# Patient Record
Sex: Female | Born: 1940 | Race: White | Hispanic: No | Marital: Married | Smoking: Never smoker
Health system: Southern US, Community
[De-identification: ages and names within clinical notes are randomized; demographics above are authoritative.]

## PROBLEM LIST (undated history)

## (undated) DIAGNOSIS — J690 Pneumonitis due to inhalation of food and vomit: Secondary | ICD-10-CM

## (undated) DIAGNOSIS — N3289 Other specified disorders of bladder: Secondary | ICD-10-CM

## (undated) DIAGNOSIS — N390 Urinary tract infection, site not specified: Secondary | ICD-10-CM

## (undated) DIAGNOSIS — R1310 Dysphagia, unspecified: Secondary | ICD-10-CM

## (undated) DIAGNOSIS — C649 Malignant neoplasm of unspecified kidney, except renal pelvis: Secondary | ICD-10-CM

## (undated) DIAGNOSIS — G20C Parkinsonism, unspecified: Secondary | ICD-10-CM

## (undated) DIAGNOSIS — G809 Cerebral palsy, unspecified: Secondary | ICD-10-CM

## (undated) DIAGNOSIS — N289 Disorder of kidney and ureter, unspecified: Secondary | ICD-10-CM

## (undated) DIAGNOSIS — Z515 Encounter for palliative care: Secondary | ICD-10-CM

## (undated) DIAGNOSIS — G20 Parkinson's disease: Secondary | ICD-10-CM

## (undated) DIAGNOSIS — S48919A Complete traumatic amputation of unspecified shoulder and upper arm, level unspecified, initial encounter: Secondary | ICD-10-CM

## (undated) HISTORY — PX: NEPHRECTOMY: SHX65

## (undated) HISTORY — PX: PEG TUBE PLACEMENT: SUR1034

## (undated) HISTORY — PX: KNEE SURGERY: SHX244

## (undated) HISTORY — PX: HAND AMPUTATION THROUGH WRIST: SHX660

---

## 1997-05-24 ENCOUNTER — Emergency Department (HOSPITAL_COMMUNITY): Admission: EM | Admit: 1997-05-24 | Discharge: 1997-05-24 | Payer: Self-pay | Admitting: Emergency Medicine

## 1997-06-29 ENCOUNTER — Emergency Department (HOSPITAL_COMMUNITY): Admission: EM | Admit: 1997-06-29 | Discharge: 1997-06-29 | Payer: Self-pay | Admitting: Emergency Medicine

## 1997-07-13 ENCOUNTER — Emergency Department (HOSPITAL_COMMUNITY): Admission: EM | Admit: 1997-07-13 | Discharge: 1997-07-13 | Payer: Self-pay | Admitting: Emergency Medicine

## 1997-07-17 ENCOUNTER — Emergency Department (HOSPITAL_COMMUNITY): Admission: EM | Admit: 1997-07-17 | Discharge: 1997-07-17 | Payer: Self-pay | Admitting: Emergency Medicine

## 1997-07-20 ENCOUNTER — Emergency Department (HOSPITAL_COMMUNITY): Admission: EM | Admit: 1997-07-20 | Discharge: 1997-07-20 | Payer: Self-pay | Admitting: Emergency Medicine

## 1997-07-24 ENCOUNTER — Emergency Department (HOSPITAL_COMMUNITY): Admission: EM | Admit: 1997-07-24 | Discharge: 1997-07-24 | Payer: Self-pay | Admitting: Emergency Medicine

## 1997-08-05 ENCOUNTER — Emergency Department (HOSPITAL_COMMUNITY): Admission: EM | Admit: 1997-08-05 | Discharge: 1997-08-05 | Payer: Self-pay | Admitting: Emergency Medicine

## 1997-08-11 ENCOUNTER — Emergency Department (HOSPITAL_COMMUNITY): Admission: EM | Admit: 1997-08-11 | Discharge: 1997-08-11 | Payer: Self-pay | Admitting: Emergency Medicine

## 1997-08-15 ENCOUNTER — Emergency Department (HOSPITAL_COMMUNITY): Admission: EM | Admit: 1997-08-15 | Discharge: 1997-08-15 | Payer: Self-pay | Admitting: Emergency Medicine

## 1997-08-22 ENCOUNTER — Emergency Department (HOSPITAL_COMMUNITY): Admission: EM | Admit: 1997-08-22 | Discharge: 1997-08-22 | Payer: Self-pay

## 1997-08-27 ENCOUNTER — Emergency Department (HOSPITAL_COMMUNITY): Admission: EM | Admit: 1997-08-27 | Discharge: 1997-08-27 | Payer: Self-pay | Admitting: Emergency Medicine

## 1997-08-31 ENCOUNTER — Emergency Department (HOSPITAL_COMMUNITY): Admission: EM | Admit: 1997-08-31 | Discharge: 1997-08-31 | Payer: Self-pay | Admitting: Emergency Medicine

## 1997-08-31 ENCOUNTER — Ambulatory Visit (HOSPITAL_COMMUNITY): Admission: RE | Admit: 1997-08-31 | Discharge: 1997-08-31 | Payer: Self-pay | Admitting: Neurosurgery

## 1997-09-15 ENCOUNTER — Emergency Department (HOSPITAL_COMMUNITY): Admission: EM | Admit: 1997-09-15 | Discharge: 1997-09-15 | Payer: Self-pay | Admitting: Emergency Medicine

## 1997-09-16 ENCOUNTER — Ambulatory Visit (HOSPITAL_COMMUNITY): Admission: RE | Admit: 1997-09-16 | Discharge: 1997-09-16 | Payer: Self-pay | Admitting: Family Medicine

## 1997-09-21 ENCOUNTER — Emergency Department (HOSPITAL_COMMUNITY): Admission: EM | Admit: 1997-09-21 | Discharge: 1997-09-22 | Payer: Self-pay | Admitting: Emergency Medicine

## 1997-09-27 ENCOUNTER — Emergency Department (HOSPITAL_COMMUNITY): Admission: EM | Admit: 1997-09-27 | Discharge: 1997-09-27 | Payer: Self-pay | Admitting: Emergency Medicine

## 1997-10-06 ENCOUNTER — Emergency Department (HOSPITAL_COMMUNITY): Admission: EM | Admit: 1997-10-06 | Discharge: 1997-10-06 | Payer: Self-pay | Admitting: Emergency Medicine

## 1997-10-09 ENCOUNTER — Emergency Department (HOSPITAL_COMMUNITY): Admission: EM | Admit: 1997-10-09 | Discharge: 1997-10-09 | Payer: Self-pay | Admitting: Emergency Medicine

## 1997-10-23 ENCOUNTER — Emergency Department (HOSPITAL_COMMUNITY): Admission: EM | Admit: 1997-10-23 | Discharge: 1997-10-23 | Payer: Self-pay | Admitting: Emergency Medicine

## 1997-10-25 ENCOUNTER — Emergency Department (HOSPITAL_COMMUNITY): Admission: EM | Admit: 1997-10-25 | Discharge: 1997-10-25 | Payer: Self-pay | Admitting: Emergency Medicine

## 1997-10-27 ENCOUNTER — Emergency Department (HOSPITAL_COMMUNITY): Admission: EM | Admit: 1997-10-27 | Discharge: 1997-10-27 | Payer: Self-pay | Admitting: Emergency Medicine

## 1997-10-31 ENCOUNTER — Emergency Department (HOSPITAL_COMMUNITY): Admission: EM | Admit: 1997-10-31 | Discharge: 1997-10-31 | Payer: Self-pay | Admitting: Emergency Medicine

## 1997-10-31 ENCOUNTER — Encounter: Payer: Self-pay | Admitting: Emergency Medicine

## 1997-11-14 ENCOUNTER — Emergency Department (HOSPITAL_COMMUNITY): Admission: EM | Admit: 1997-11-14 | Discharge: 1997-11-14 | Payer: Self-pay | Admitting: Emergency Medicine

## 1997-11-15 ENCOUNTER — Emergency Department (HOSPITAL_COMMUNITY): Admission: EM | Admit: 1997-11-15 | Discharge: 1997-11-15 | Payer: Self-pay | Admitting: Emergency Medicine

## 1997-11-25 ENCOUNTER — Emergency Department (HOSPITAL_COMMUNITY): Admission: EM | Admit: 1997-11-25 | Discharge: 1997-11-25 | Payer: Self-pay | Admitting: Emergency Medicine

## 1997-11-25 ENCOUNTER — Encounter: Payer: Self-pay | Admitting: Emergency Medicine

## 1997-12-12 ENCOUNTER — Emergency Department (HOSPITAL_COMMUNITY): Admission: EM | Admit: 1997-12-12 | Discharge: 1997-12-12 | Payer: Self-pay | Admitting: Emergency Medicine

## 1997-12-14 ENCOUNTER — Emergency Department (HOSPITAL_COMMUNITY): Admission: EM | Admit: 1997-12-14 | Discharge: 1997-12-14 | Payer: Self-pay | Admitting: Emergency Medicine

## 1997-12-16 ENCOUNTER — Emergency Department (HOSPITAL_COMMUNITY): Admission: EM | Admit: 1997-12-16 | Discharge: 1997-12-16 | Payer: Self-pay | Admitting: Emergency Medicine

## 1997-12-20 ENCOUNTER — Emergency Department (HOSPITAL_COMMUNITY): Admission: EM | Admit: 1997-12-20 | Discharge: 1997-12-20 | Payer: Self-pay | Admitting: Emergency Medicine

## 1998-01-03 ENCOUNTER — Encounter: Payer: Self-pay | Admitting: Emergency Medicine

## 1998-01-03 ENCOUNTER — Emergency Department (HOSPITAL_COMMUNITY): Admission: EM | Admit: 1998-01-03 | Discharge: 1998-01-03 | Payer: Self-pay | Admitting: Emergency Medicine

## 1998-01-11 ENCOUNTER — Emergency Department (HOSPITAL_COMMUNITY): Admission: EM | Admit: 1998-01-11 | Discharge: 1998-01-11 | Payer: Self-pay | Admitting: Internal Medicine

## 1998-01-15 ENCOUNTER — Emergency Department (HOSPITAL_COMMUNITY): Admission: EM | Admit: 1998-01-15 | Discharge: 1998-01-15 | Payer: Self-pay | Admitting: Emergency Medicine

## 1998-01-15 ENCOUNTER — Encounter: Payer: Self-pay | Admitting: Emergency Medicine

## 1998-01-20 ENCOUNTER — Emergency Department (HOSPITAL_COMMUNITY): Admission: EM | Admit: 1998-01-20 | Discharge: 1998-01-20 | Payer: Self-pay | Admitting: Emergency Medicine

## 1998-01-28 ENCOUNTER — Emergency Department (HOSPITAL_COMMUNITY): Admission: EM | Admit: 1998-01-28 | Discharge: 1998-01-28 | Payer: Self-pay | Admitting: Emergency Medicine

## 1998-02-03 ENCOUNTER — Emergency Department (HOSPITAL_COMMUNITY): Admission: EM | Admit: 1998-02-03 | Discharge: 1998-02-03 | Payer: Self-pay

## 1998-02-14 ENCOUNTER — Emergency Department (HOSPITAL_COMMUNITY): Admission: EM | Admit: 1998-02-14 | Discharge: 1998-02-15 | Payer: Self-pay

## 1998-02-15 ENCOUNTER — Encounter: Payer: Self-pay | Admitting: Emergency Medicine

## 1998-02-20 ENCOUNTER — Emergency Department (HOSPITAL_COMMUNITY): Admission: EM | Admit: 1998-02-20 | Discharge: 1998-02-20 | Payer: Self-pay | Admitting: Emergency Medicine

## 1998-03-06 ENCOUNTER — Emergency Department (HOSPITAL_COMMUNITY): Admission: EM | Admit: 1998-03-06 | Discharge: 1998-03-06 | Payer: Self-pay | Admitting: Emergency Medicine

## 1998-03-07 ENCOUNTER — Emergency Department (HOSPITAL_COMMUNITY): Admission: EM | Admit: 1998-03-07 | Discharge: 1998-03-08 | Payer: Self-pay | Admitting: Emergency Medicine

## 1998-03-08 ENCOUNTER — Encounter: Payer: Self-pay | Admitting: Emergency Medicine

## 1998-03-08 ENCOUNTER — Emergency Department (HOSPITAL_COMMUNITY): Admission: EM | Admit: 1998-03-08 | Discharge: 1998-03-08 | Payer: Self-pay | Admitting: Emergency Medicine

## 1998-03-14 ENCOUNTER — Emergency Department (HOSPITAL_COMMUNITY): Admission: EM | Admit: 1998-03-14 | Discharge: 1998-03-14 | Payer: Self-pay | Admitting: Emergency Medicine

## 1998-03-28 ENCOUNTER — Emergency Department (HOSPITAL_COMMUNITY): Admission: EM | Admit: 1998-03-28 | Discharge: 1998-03-28 | Payer: Self-pay | Admitting: Emergency Medicine

## 1998-03-28 ENCOUNTER — Encounter: Payer: Self-pay | Admitting: Emergency Medicine

## 1998-04-04 ENCOUNTER — Emergency Department (HOSPITAL_COMMUNITY): Admission: EM | Admit: 1998-04-04 | Discharge: 1998-04-04 | Payer: Self-pay | Admitting: Emergency Medicine

## 1998-04-06 ENCOUNTER — Emergency Department (HOSPITAL_COMMUNITY): Admission: EM | Admit: 1998-04-06 | Discharge: 1998-04-06 | Payer: Self-pay | Admitting: Internal Medicine

## 1998-04-12 ENCOUNTER — Emergency Department (HOSPITAL_COMMUNITY): Admission: EM | Admit: 1998-04-12 | Discharge: 1998-04-12 | Payer: Self-pay

## 1998-06-02 ENCOUNTER — Ambulatory Visit (HOSPITAL_BASED_OUTPATIENT_CLINIC_OR_DEPARTMENT_OTHER): Admission: RE | Admit: 1998-06-02 | Discharge: 1998-06-02 | Payer: Self-pay | Admitting: Orthopedic Surgery

## 1998-06-27 ENCOUNTER — Emergency Department (HOSPITAL_COMMUNITY): Admission: EM | Admit: 1998-06-27 | Discharge: 1998-06-27 | Payer: Self-pay | Admitting: Emergency Medicine

## 1998-07-01 ENCOUNTER — Emergency Department (HOSPITAL_COMMUNITY): Admission: EM | Admit: 1998-07-01 | Discharge: 1998-07-01 | Payer: Self-pay | Admitting: Emergency Medicine

## 1998-07-02 ENCOUNTER — Encounter: Payer: Self-pay | Admitting: Emergency Medicine

## 1998-07-02 ENCOUNTER — Emergency Department (HOSPITAL_COMMUNITY): Admission: EM | Admit: 1998-07-02 | Discharge: 1998-07-02 | Payer: Self-pay | Admitting: Emergency Medicine

## 1998-07-11 ENCOUNTER — Emergency Department (HOSPITAL_COMMUNITY): Admission: EM | Admit: 1998-07-11 | Discharge: 1998-07-11 | Payer: Self-pay | Admitting: Emergency Medicine

## 1998-07-14 ENCOUNTER — Emergency Department (HOSPITAL_COMMUNITY): Admission: EM | Admit: 1998-07-14 | Discharge: 1998-07-14 | Payer: Self-pay | Admitting: Emergency Medicine

## 1998-07-21 ENCOUNTER — Emergency Department (HOSPITAL_COMMUNITY): Admission: EM | Admit: 1998-07-21 | Discharge: 1998-07-22 | Payer: Self-pay | Admitting: Emergency Medicine

## 1998-07-31 ENCOUNTER — Encounter: Payer: Self-pay | Admitting: Internal Medicine

## 1998-07-31 ENCOUNTER — Emergency Department (HOSPITAL_COMMUNITY): Admission: EM | Admit: 1998-07-31 | Discharge: 1998-07-31 | Payer: Self-pay | Admitting: Internal Medicine

## 1998-08-03 ENCOUNTER — Encounter: Payer: Self-pay | Admitting: Emergency Medicine

## 1998-08-03 ENCOUNTER — Emergency Department (HOSPITAL_COMMUNITY): Admission: EM | Admit: 1998-08-03 | Discharge: 1998-08-03 | Payer: Self-pay | Admitting: Emergency Medicine

## 1998-08-04 ENCOUNTER — Ambulatory Visit: Admission: RE | Admit: 1998-08-04 | Discharge: 1998-08-04 | Payer: Self-pay | Admitting: Emergency Medicine

## 1998-08-04 ENCOUNTER — Encounter: Payer: Self-pay | Admitting: Emergency Medicine

## 1998-08-14 ENCOUNTER — Inpatient Hospital Stay (HOSPITAL_COMMUNITY): Admission: RE | Admit: 1998-08-14 | Discharge: 1998-08-18 | Payer: Self-pay | Admitting: Urology

## 1998-09-02 ENCOUNTER — Emergency Department (HOSPITAL_COMMUNITY): Admission: EM | Admit: 1998-09-02 | Discharge: 1998-09-02 | Payer: Self-pay | Admitting: *Deleted

## 1998-09-13 ENCOUNTER — Encounter: Payer: Self-pay | Admitting: Emergency Medicine

## 1998-09-13 ENCOUNTER — Emergency Department (HOSPITAL_COMMUNITY): Admission: EM | Admit: 1998-09-13 | Discharge: 1998-09-13 | Payer: Self-pay | Admitting: Emergency Medicine

## 1998-09-26 ENCOUNTER — Emergency Department (HOSPITAL_COMMUNITY): Admission: EM | Admit: 1998-09-26 | Discharge: 1998-09-26 | Payer: Self-pay | Admitting: Internal Medicine

## 1998-10-02 ENCOUNTER — Emergency Department (HOSPITAL_COMMUNITY): Admission: EM | Admit: 1998-10-02 | Discharge: 1998-10-03 | Payer: Self-pay | Admitting: Emergency Medicine

## 1998-10-05 ENCOUNTER — Encounter: Payer: Self-pay | Admitting: Emergency Medicine

## 1998-10-05 ENCOUNTER — Emergency Department (HOSPITAL_COMMUNITY): Admission: EM | Admit: 1998-10-05 | Discharge: 1998-10-05 | Payer: Self-pay | Admitting: Emergency Medicine

## 1998-10-08 ENCOUNTER — Emergency Department (HOSPITAL_COMMUNITY): Admission: EM | Admit: 1998-10-08 | Discharge: 1998-10-09 | Payer: Self-pay | Admitting: Emergency Medicine

## 1998-10-24 ENCOUNTER — Emergency Department (HOSPITAL_COMMUNITY): Admission: EM | Admit: 1998-10-24 | Discharge: 1998-10-24 | Payer: Self-pay | Admitting: Internal Medicine

## 1998-11-02 ENCOUNTER — Emergency Department (HOSPITAL_COMMUNITY): Admission: EM | Admit: 1998-11-02 | Discharge: 1998-11-02 | Payer: Self-pay | Admitting: Emergency Medicine

## 1998-11-17 ENCOUNTER — Emergency Department (HOSPITAL_COMMUNITY): Admission: EM | Admit: 1998-11-17 | Discharge: 1998-11-18 | Payer: Self-pay | Admitting: Emergency Medicine

## 1998-11-18 ENCOUNTER — Encounter: Payer: Self-pay | Admitting: Emergency Medicine

## 1998-12-19 ENCOUNTER — Emergency Department (HOSPITAL_COMMUNITY): Admission: EM | Admit: 1998-12-19 | Discharge: 1998-12-19 | Payer: Self-pay | Admitting: Emergency Medicine

## 1998-12-23 ENCOUNTER — Emergency Department (HOSPITAL_COMMUNITY): Admission: EM | Admit: 1998-12-23 | Discharge: 1998-12-23 | Payer: Self-pay | Admitting: *Deleted

## 1998-12-25 ENCOUNTER — Emergency Department (HOSPITAL_COMMUNITY): Admission: EM | Admit: 1998-12-25 | Discharge: 1998-12-25 | Payer: Self-pay | Admitting: Emergency Medicine

## 1999-01-25 ENCOUNTER — Emergency Department (HOSPITAL_COMMUNITY): Admission: EM | Admit: 1999-01-25 | Discharge: 1999-01-25 | Payer: Self-pay | Admitting: *Deleted

## 1999-01-29 ENCOUNTER — Ambulatory Visit (HOSPITAL_COMMUNITY): Admission: RE | Admit: 1999-01-29 | Discharge: 1999-01-29 | Payer: Self-pay | Admitting: Family Medicine

## 1999-01-29 ENCOUNTER — Encounter: Payer: Self-pay | Admitting: Family Medicine

## 1999-02-01 ENCOUNTER — Emergency Department (HOSPITAL_COMMUNITY): Admission: EM | Admit: 1999-02-01 | Discharge: 1999-02-01 | Payer: Self-pay | Admitting: *Deleted

## 1999-02-06 ENCOUNTER — Emergency Department (HOSPITAL_COMMUNITY): Admission: EM | Admit: 1999-02-06 | Discharge: 1999-02-06 | Payer: Self-pay | Admitting: Emergency Medicine

## 1999-03-10 ENCOUNTER — Emergency Department (HOSPITAL_COMMUNITY): Admission: EM | Admit: 1999-03-10 | Discharge: 1999-03-10 | Payer: Self-pay | Admitting: Emergency Medicine

## 1999-03-25 ENCOUNTER — Emergency Department (HOSPITAL_COMMUNITY): Admission: EM | Admit: 1999-03-25 | Discharge: 1999-03-25 | Payer: Self-pay | Admitting: Emergency Medicine

## 1999-04-13 ENCOUNTER — Emergency Department (HOSPITAL_COMMUNITY): Admission: EM | Admit: 1999-04-13 | Discharge: 1999-04-14 | Payer: Self-pay

## 1999-04-26 ENCOUNTER — Emergency Department (HOSPITAL_COMMUNITY): Admission: EM | Admit: 1999-04-26 | Discharge: 1999-04-26 | Payer: Self-pay | Admitting: Emergency Medicine

## 1999-06-25 ENCOUNTER — Emergency Department (HOSPITAL_COMMUNITY): Admission: EM | Admit: 1999-06-25 | Discharge: 1999-06-25 | Payer: Self-pay | Admitting: Emergency Medicine

## 1999-07-28 ENCOUNTER — Encounter: Payer: Self-pay | Admitting: Family Medicine

## 1999-07-28 ENCOUNTER — Ambulatory Visit (HOSPITAL_COMMUNITY): Admission: RE | Admit: 1999-07-28 | Discharge: 1999-07-28 | Payer: Self-pay | Admitting: Family Medicine

## 1999-08-12 ENCOUNTER — Encounter: Payer: Self-pay | Admitting: Dermatology

## 1999-08-12 ENCOUNTER — Ambulatory Visit (HOSPITAL_COMMUNITY): Admission: RE | Admit: 1999-08-12 | Discharge: 1999-08-12 | Payer: Self-pay | Admitting: Dermatology

## 1999-08-30 ENCOUNTER — Ambulatory Visit (HOSPITAL_COMMUNITY): Admission: RE | Admit: 1999-08-30 | Discharge: 1999-08-30 | Payer: Self-pay | Admitting: General Surgery

## 1999-08-30 ENCOUNTER — Encounter (INDEPENDENT_AMBULATORY_CARE_PROVIDER_SITE_OTHER): Payer: Self-pay

## 1999-09-13 ENCOUNTER — Emergency Department (HOSPITAL_COMMUNITY): Admission: EM | Admit: 1999-09-13 | Discharge: 1999-09-13 | Payer: Self-pay | Admitting: Emergency Medicine

## 1999-09-17 ENCOUNTER — Emergency Department (HOSPITAL_COMMUNITY): Admission: EM | Admit: 1999-09-17 | Discharge: 1999-09-17 | Payer: Self-pay | Admitting: Emergency Medicine

## 1999-09-25 ENCOUNTER — Emergency Department (HOSPITAL_COMMUNITY): Admission: EM | Admit: 1999-09-25 | Discharge: 1999-09-25 | Payer: Self-pay | Admitting: Emergency Medicine

## 1999-10-03 ENCOUNTER — Emergency Department (HOSPITAL_COMMUNITY): Admission: EM | Admit: 1999-10-03 | Discharge: 1999-10-03 | Payer: Self-pay

## 1999-10-11 ENCOUNTER — Emergency Department (HOSPITAL_COMMUNITY): Admission: EM | Admit: 1999-10-11 | Discharge: 1999-10-11 | Payer: Self-pay | Admitting: Emergency Medicine

## 1999-11-10 ENCOUNTER — Emergency Department (HOSPITAL_COMMUNITY): Admission: EM | Admit: 1999-11-10 | Discharge: 1999-11-10 | Payer: Self-pay | Admitting: Emergency Medicine

## 1999-11-15 ENCOUNTER — Encounter: Payer: Self-pay | Admitting: General Surgery

## 1999-11-15 ENCOUNTER — Encounter (INDEPENDENT_AMBULATORY_CARE_PROVIDER_SITE_OTHER): Payer: Self-pay | Admitting: *Deleted

## 1999-11-15 ENCOUNTER — Encounter: Admission: RE | Admit: 1999-11-15 | Discharge: 1999-11-15 | Payer: Self-pay | Admitting: General Surgery

## 1999-11-15 ENCOUNTER — Other Ambulatory Visit: Admission: RE | Admit: 1999-11-15 | Discharge: 1999-11-15 | Payer: Self-pay | Admitting: General Surgery

## 1999-11-22 ENCOUNTER — Ambulatory Visit (HOSPITAL_COMMUNITY): Admission: RE | Admit: 1999-11-22 | Discharge: 1999-11-22 | Payer: Self-pay | Admitting: Orthopedic Surgery

## 1999-11-22 ENCOUNTER — Encounter: Payer: Self-pay | Admitting: Rheumatology

## 1999-12-01 ENCOUNTER — Emergency Department (HOSPITAL_COMMUNITY): Admission: EM | Admit: 1999-12-01 | Discharge: 1999-12-01 | Payer: Self-pay | Admitting: Internal Medicine

## 1999-12-04 ENCOUNTER — Encounter: Payer: Self-pay | Admitting: Emergency Medicine

## 1999-12-04 ENCOUNTER — Emergency Department (HOSPITAL_COMMUNITY): Admission: EM | Admit: 1999-12-04 | Discharge: 1999-12-04 | Payer: Self-pay | Admitting: Emergency Medicine

## 1999-12-07 ENCOUNTER — Ambulatory Visit (HOSPITAL_COMMUNITY): Admission: RE | Admit: 1999-12-07 | Discharge: 1999-12-07 | Payer: Self-pay | Admitting: Family Medicine

## 1999-12-24 ENCOUNTER — Emergency Department (HOSPITAL_COMMUNITY): Admission: EM | Admit: 1999-12-24 | Discharge: 1999-12-25 | Payer: Self-pay | Admitting: Emergency Medicine

## 1999-12-25 ENCOUNTER — Emergency Department (HOSPITAL_COMMUNITY): Admission: EM | Admit: 1999-12-25 | Discharge: 1999-12-25 | Payer: Self-pay | Admitting: Emergency Medicine

## 2000-01-07 ENCOUNTER — Ambulatory Visit (HOSPITAL_COMMUNITY): Admission: RE | Admit: 2000-01-07 | Discharge: 2000-01-07 | Payer: Self-pay | Admitting: Family Medicine

## 2000-01-07 ENCOUNTER — Encounter: Payer: Self-pay | Admitting: Family Medicine

## 2000-01-08 ENCOUNTER — Emergency Department (HOSPITAL_COMMUNITY): Admission: EM | Admit: 2000-01-08 | Discharge: 2000-01-08 | Payer: Self-pay | Admitting: Emergency Medicine

## 2000-01-23 ENCOUNTER — Encounter: Payer: Self-pay | Admitting: Emergency Medicine

## 2000-01-23 ENCOUNTER — Emergency Department (HOSPITAL_COMMUNITY): Admission: EM | Admit: 2000-01-23 | Discharge: 2000-01-23 | Payer: Self-pay | Admitting: Emergency Medicine

## 2000-01-31 ENCOUNTER — Emergency Department (HOSPITAL_COMMUNITY): Admission: EM | Admit: 2000-01-31 | Discharge: 2000-01-31 | Payer: Self-pay | Admitting: Internal Medicine

## 2000-01-31 ENCOUNTER — Encounter: Payer: Self-pay | Admitting: Emergency Medicine

## 2000-02-04 ENCOUNTER — Emergency Department (HOSPITAL_COMMUNITY): Admission: EM | Admit: 2000-02-04 | Discharge: 2000-02-04 | Payer: Self-pay | Admitting: *Deleted

## 2000-02-08 ENCOUNTER — Emergency Department (HOSPITAL_COMMUNITY): Admission: EM | Admit: 2000-02-08 | Discharge: 2000-02-08 | Payer: Self-pay | Admitting: Internal Medicine

## 2000-03-02 ENCOUNTER — Emergency Department (HOSPITAL_COMMUNITY): Admission: EM | Admit: 2000-03-02 | Discharge: 2000-03-02 | Payer: Self-pay | Admitting: Emergency Medicine

## 2000-03-04 ENCOUNTER — Emergency Department (HOSPITAL_COMMUNITY): Admission: EM | Admit: 2000-03-04 | Discharge: 2000-03-04 | Payer: Self-pay | Admitting: *Deleted

## 2000-03-11 ENCOUNTER — Emergency Department (HOSPITAL_COMMUNITY): Admission: EM | Admit: 2000-03-11 | Discharge: 2000-03-12 | Payer: Self-pay | Admitting: Emergency Medicine

## 2000-03-21 ENCOUNTER — Emergency Department (HOSPITAL_COMMUNITY): Admission: EM | Admit: 2000-03-21 | Discharge: 2000-03-21 | Payer: Self-pay | Admitting: Emergency Medicine

## 2000-03-21 ENCOUNTER — Encounter: Payer: Self-pay | Admitting: Internal Medicine

## 2000-03-25 ENCOUNTER — Emergency Department (HOSPITAL_COMMUNITY): Admission: EM | Admit: 2000-03-25 | Discharge: 2000-03-25 | Payer: Self-pay | Admitting: Diagnostic Radiology

## 2000-03-25 ENCOUNTER — Encounter: Payer: Self-pay | Admitting: Emergency Medicine

## 2000-03-27 ENCOUNTER — Emergency Department (HOSPITAL_COMMUNITY): Admission: EM | Admit: 2000-03-27 | Discharge: 2000-03-27 | Payer: Self-pay | Admitting: Emergency Medicine

## 2000-03-27 ENCOUNTER — Encounter: Payer: Self-pay | Admitting: Emergency Medicine

## 2000-03-29 ENCOUNTER — Inpatient Hospital Stay (HOSPITAL_COMMUNITY): Admission: EM | Admit: 2000-03-29 | Discharge: 2000-03-31 | Payer: Self-pay | Admitting: Family Medicine

## 2000-03-30 ENCOUNTER — Encounter: Payer: Self-pay | Admitting: Internal Medicine

## 2000-04-29 ENCOUNTER — Emergency Department (HOSPITAL_COMMUNITY): Admission: EM | Admit: 2000-04-29 | Discharge: 2000-04-29 | Payer: Self-pay | Admitting: *Deleted

## 2000-05-02 ENCOUNTER — Emergency Department (HOSPITAL_COMMUNITY): Admission: EM | Admit: 2000-05-02 | Discharge: 2000-05-02 | Payer: Self-pay | Admitting: Emergency Medicine

## 2000-05-07 ENCOUNTER — Emergency Department (HOSPITAL_COMMUNITY): Admission: EM | Admit: 2000-05-07 | Discharge: 2000-05-07 | Payer: Self-pay | Admitting: Emergency Medicine

## 2000-05-08 ENCOUNTER — Emergency Department (HOSPITAL_COMMUNITY): Admission: EM | Admit: 2000-05-08 | Discharge: 2000-05-08 | Payer: Self-pay | Admitting: Emergency Medicine

## 2000-06-19 ENCOUNTER — Emergency Department (HOSPITAL_COMMUNITY): Admission: EM | Admit: 2000-06-19 | Discharge: 2000-06-19 | Payer: Self-pay | Admitting: Emergency Medicine

## 2000-07-01 ENCOUNTER — Encounter: Payer: Self-pay | Admitting: Emergency Medicine

## 2000-07-01 ENCOUNTER — Emergency Department (HOSPITAL_COMMUNITY): Admission: EM | Admit: 2000-07-01 | Discharge: 2000-07-01 | Payer: Self-pay | Admitting: Emergency Medicine

## 2000-07-12 ENCOUNTER — Emergency Department (HOSPITAL_COMMUNITY): Admission: EM | Admit: 2000-07-12 | Discharge: 2000-07-12 | Payer: Self-pay | Admitting: *Deleted

## 2000-07-23 ENCOUNTER — Emergency Department (HOSPITAL_COMMUNITY): Admission: EM | Admit: 2000-07-23 | Discharge: 2000-07-23 | Payer: Self-pay

## 2000-07-29 ENCOUNTER — Emergency Department (HOSPITAL_COMMUNITY): Admission: EM | Admit: 2000-07-29 | Discharge: 2000-07-29 | Payer: Self-pay | Admitting: Internal Medicine

## 2000-08-01 ENCOUNTER — Emergency Department (HOSPITAL_COMMUNITY): Admission: EM | Admit: 2000-08-01 | Discharge: 2000-08-01 | Payer: Self-pay | Admitting: Emergency Medicine

## 2000-09-10 ENCOUNTER — Emergency Department (HOSPITAL_COMMUNITY): Admission: EM | Admit: 2000-09-10 | Discharge: 2000-09-10 | Payer: Self-pay | Admitting: *Deleted

## 2000-09-18 ENCOUNTER — Ambulatory Visit (HOSPITAL_COMMUNITY): Admission: RE | Admit: 2000-09-18 | Discharge: 2000-09-18 | Payer: Self-pay | Admitting: Orthopedic Surgery

## 2000-09-21 ENCOUNTER — Encounter: Payer: Self-pay | Admitting: Emergency Medicine

## 2000-09-21 ENCOUNTER — Encounter: Payer: Self-pay | Admitting: Internal Medicine

## 2000-09-21 ENCOUNTER — Emergency Department (HOSPITAL_COMMUNITY): Admission: EM | Admit: 2000-09-21 | Discharge: 2000-09-22 | Payer: Self-pay | Admitting: Emergency Medicine

## 2000-09-23 ENCOUNTER — Emergency Department (HOSPITAL_COMMUNITY): Admission: EM | Admit: 2000-09-23 | Discharge: 2000-09-23 | Payer: Self-pay | Admitting: Emergency Medicine

## 2000-10-09 ENCOUNTER — Emergency Department (HOSPITAL_COMMUNITY): Admission: EM | Admit: 2000-10-09 | Discharge: 2000-10-09 | Payer: Self-pay | Admitting: Emergency Medicine

## 2000-10-26 ENCOUNTER — Emergency Department (HOSPITAL_COMMUNITY): Admission: EM | Admit: 2000-10-26 | Discharge: 2000-10-26 | Payer: Self-pay | Admitting: Emergency Medicine

## 2000-10-28 ENCOUNTER — Emergency Department (HOSPITAL_COMMUNITY): Admission: EM | Admit: 2000-10-28 | Discharge: 2000-10-28 | Payer: Self-pay

## 2000-10-28 ENCOUNTER — Encounter: Payer: Self-pay | Admitting: Emergency Medicine

## 2000-11-20 ENCOUNTER — Encounter: Payer: Self-pay | Admitting: Emergency Medicine

## 2000-11-20 ENCOUNTER — Emergency Department (HOSPITAL_COMMUNITY): Admission: EM | Admit: 2000-11-20 | Discharge: 2000-11-20 | Payer: Self-pay | Admitting: *Deleted

## 2000-11-24 ENCOUNTER — Emergency Department (HOSPITAL_COMMUNITY): Admission: EM | Admit: 2000-11-24 | Discharge: 2000-11-24 | Payer: Self-pay | Admitting: Emergency Medicine

## 2000-12-11 ENCOUNTER — Emergency Department (HOSPITAL_COMMUNITY): Admission: EM | Admit: 2000-12-11 | Discharge: 2000-12-11 | Payer: Self-pay | Admitting: Emergency Medicine

## 2000-12-11 ENCOUNTER — Encounter: Payer: Self-pay | Admitting: Emergency Medicine

## 2000-12-17 ENCOUNTER — Emergency Department (HOSPITAL_COMMUNITY): Admission: EM | Admit: 2000-12-17 | Discharge: 2000-12-18 | Payer: Self-pay | Admitting: Emergency Medicine

## 2000-12-17 ENCOUNTER — Encounter: Payer: Self-pay | Admitting: Pediatrics

## 2001-01-04 ENCOUNTER — Emergency Department (HOSPITAL_COMMUNITY): Admission: EM | Admit: 2001-01-04 | Discharge: 2001-01-04 | Payer: Self-pay | Admitting: Emergency Medicine

## 2001-01-05 ENCOUNTER — Emergency Department (HOSPITAL_COMMUNITY): Admission: EM | Admit: 2001-01-05 | Discharge: 2001-01-06 | Payer: Self-pay | Admitting: Emergency Medicine

## 2001-01-15 ENCOUNTER — Emergency Department (HOSPITAL_COMMUNITY): Admission: EM | Admit: 2001-01-15 | Discharge: 2001-01-15 | Payer: Self-pay | Admitting: Emergency Medicine

## 2001-01-16 ENCOUNTER — Emergency Department (HOSPITAL_COMMUNITY): Admission: EM | Admit: 2001-01-16 | Discharge: 2001-01-16 | Payer: Self-pay

## 2001-01-20 ENCOUNTER — Emergency Department (HOSPITAL_COMMUNITY): Admission: EM | Admit: 2001-01-20 | Discharge: 2001-01-20 | Payer: Self-pay

## 2001-01-21 ENCOUNTER — Emergency Department (HOSPITAL_COMMUNITY): Admission: EM | Admit: 2001-01-21 | Discharge: 2001-01-21 | Payer: Self-pay | Admitting: Emergency Medicine

## 2001-01-22 ENCOUNTER — Emergency Department (HOSPITAL_COMMUNITY): Admission: EM | Admit: 2001-01-22 | Discharge: 2001-01-22 | Payer: Self-pay | Admitting: Emergency Medicine

## 2001-01-24 ENCOUNTER — Emergency Department (HOSPITAL_COMMUNITY): Admission: EM | Admit: 2001-01-24 | Discharge: 2001-01-25 | Payer: Self-pay | Admitting: Emergency Medicine

## 2001-02-01 ENCOUNTER — Emergency Department (HOSPITAL_COMMUNITY): Admission: EM | Admit: 2001-02-01 | Discharge: 2001-02-01 | Payer: Self-pay | Admitting: Emergency Medicine

## 2001-02-05 ENCOUNTER — Emergency Department (HOSPITAL_COMMUNITY): Admission: EM | Admit: 2001-02-05 | Discharge: 2001-02-06 | Payer: Self-pay | Admitting: Emergency Medicine

## 2001-02-11 ENCOUNTER — Emergency Department (HOSPITAL_COMMUNITY): Admission: EM | Admit: 2001-02-11 | Discharge: 2001-02-11 | Payer: Self-pay | Admitting: Emergency Medicine

## 2001-02-17 ENCOUNTER — Emergency Department (HOSPITAL_COMMUNITY): Admission: EM | Admit: 2001-02-17 | Discharge: 2001-02-17 | Payer: Self-pay | Admitting: Emergency Medicine

## 2001-02-19 ENCOUNTER — Emergency Department (HOSPITAL_COMMUNITY): Admission: EM | Admit: 2001-02-19 | Discharge: 2001-02-20 | Payer: Self-pay | Admitting: Emergency Medicine

## 2001-02-21 ENCOUNTER — Emergency Department (HOSPITAL_COMMUNITY): Admission: EM | Admit: 2001-02-21 | Discharge: 2001-02-22 | Payer: Self-pay | Admitting: *Deleted

## 2001-02-24 ENCOUNTER — Emergency Department (HOSPITAL_COMMUNITY): Admission: EM | Admit: 2001-02-24 | Discharge: 2001-02-24 | Payer: Self-pay | Admitting: Emergency Medicine

## 2001-03-13 ENCOUNTER — Emergency Department (HOSPITAL_COMMUNITY): Admission: EM | Admit: 2001-03-13 | Discharge: 2001-03-14 | Payer: Self-pay | Admitting: Emergency Medicine

## 2001-03-14 ENCOUNTER — Encounter: Payer: Self-pay | Admitting: Emergency Medicine

## 2001-03-19 ENCOUNTER — Emergency Department (HOSPITAL_COMMUNITY): Admission: EM | Admit: 2001-03-19 | Discharge: 2001-03-19 | Payer: Self-pay | Admitting: Emergency Medicine

## 2001-03-19 ENCOUNTER — Encounter: Payer: Self-pay | Admitting: Internal Medicine

## 2001-04-05 ENCOUNTER — Emergency Department (HOSPITAL_COMMUNITY): Admission: EM | Admit: 2001-04-05 | Discharge: 2001-04-05 | Payer: Self-pay | Admitting: Emergency Medicine

## 2001-04-07 ENCOUNTER — Encounter: Payer: Self-pay | Admitting: Emergency Medicine

## 2001-04-07 ENCOUNTER — Emergency Department (HOSPITAL_COMMUNITY): Admission: EM | Admit: 2001-04-07 | Discharge: 2001-04-07 | Payer: Self-pay | Admitting: *Deleted

## 2001-04-16 ENCOUNTER — Emergency Department (HOSPITAL_COMMUNITY): Admission: EM | Admit: 2001-04-16 | Discharge: 2001-04-16 | Payer: Self-pay | Admitting: Emergency Medicine

## 2001-04-17 ENCOUNTER — Ambulatory Visit (HOSPITAL_BASED_OUTPATIENT_CLINIC_OR_DEPARTMENT_OTHER): Admission: RE | Admit: 2001-04-17 | Discharge: 2001-04-18 | Payer: Self-pay | Admitting: Orthopedic Surgery

## 2001-04-19 ENCOUNTER — Inpatient Hospital Stay (HOSPITAL_COMMUNITY): Admission: EM | Admit: 2001-04-19 | Discharge: 2001-04-24 | Payer: Self-pay | Admitting: Emergency Medicine

## 2001-04-19 ENCOUNTER — Encounter: Payer: Self-pay | Admitting: Emergency Medicine

## 2001-04-20 ENCOUNTER — Encounter: Payer: Self-pay | Admitting: Internal Medicine

## 2001-04-22 ENCOUNTER — Encounter: Payer: Self-pay | Admitting: Internal Medicine

## 2001-06-22 ENCOUNTER — Emergency Department (HOSPITAL_COMMUNITY): Admission: EM | Admit: 2001-06-22 | Discharge: 2001-06-22 | Payer: Self-pay | Admitting: Emergency Medicine

## 2001-08-13 ENCOUNTER — Emergency Department (HOSPITAL_COMMUNITY): Admission: EM | Admit: 2001-08-13 | Discharge: 2001-08-13 | Payer: Self-pay | Admitting: Emergency Medicine

## 2001-08-19 ENCOUNTER — Emergency Department (HOSPITAL_COMMUNITY): Admission: EM | Admit: 2001-08-19 | Discharge: 2001-08-19 | Payer: Self-pay | Admitting: Emergency Medicine

## 2001-08-27 ENCOUNTER — Emergency Department (HOSPITAL_COMMUNITY): Admission: EM | Admit: 2001-08-27 | Discharge: 2001-08-27 | Payer: Self-pay | Admitting: Emergency Medicine

## 2001-08-28 ENCOUNTER — Emergency Department (HOSPITAL_COMMUNITY): Admission: EM | Admit: 2001-08-28 | Discharge: 2001-08-29 | Payer: Self-pay | Admitting: Emergency Medicine

## 2001-08-30 ENCOUNTER — Emergency Department (HOSPITAL_COMMUNITY): Admission: EM | Admit: 2001-08-30 | Discharge: 2001-08-30 | Payer: Self-pay | Admitting: Emergency Medicine

## 2001-08-31 ENCOUNTER — Emergency Department (HOSPITAL_COMMUNITY): Admission: EM | Admit: 2001-08-31 | Discharge: 2001-08-31 | Payer: Self-pay | Admitting: Emergency Medicine

## 2001-09-08 ENCOUNTER — Encounter: Payer: Self-pay | Admitting: Emergency Medicine

## 2001-09-08 ENCOUNTER — Emergency Department (HOSPITAL_COMMUNITY): Admission: EM | Admit: 2001-09-08 | Discharge: 2001-09-08 | Payer: Self-pay | Admitting: Emergency Medicine

## 2001-09-13 ENCOUNTER — Encounter: Payer: Self-pay | Admitting: Emergency Medicine

## 2001-09-13 ENCOUNTER — Emergency Department (HOSPITAL_COMMUNITY): Admission: EM | Admit: 2001-09-13 | Discharge: 2001-09-13 | Payer: Self-pay | Admitting: Emergency Medicine

## 2001-09-18 ENCOUNTER — Inpatient Hospital Stay (HOSPITAL_COMMUNITY): Admission: EM | Admit: 2001-09-18 | Discharge: 2001-09-21 | Payer: Self-pay | Admitting: Family Medicine

## 2001-12-20 ENCOUNTER — Encounter: Payer: Self-pay | Admitting: Emergency Medicine

## 2001-12-20 ENCOUNTER — Emergency Department (HOSPITAL_COMMUNITY): Admission: EM | Admit: 2001-12-20 | Discharge: 2001-12-21 | Payer: Self-pay | Admitting: Emergency Medicine

## 2001-12-25 ENCOUNTER — Emergency Department (HOSPITAL_COMMUNITY): Admission: EM | Admit: 2001-12-25 | Discharge: 2001-12-25 | Payer: Self-pay | Admitting: Emergency Medicine

## 2001-12-27 ENCOUNTER — Emergency Department (HOSPITAL_COMMUNITY): Admission: EM | Admit: 2001-12-27 | Discharge: 2001-12-27 | Payer: Self-pay | Admitting: Emergency Medicine

## 2001-12-31 ENCOUNTER — Emergency Department (HOSPITAL_COMMUNITY): Admission: EM | Admit: 2001-12-31 | Discharge: 2001-12-31 | Payer: Self-pay | Admitting: Emergency Medicine

## 2002-01-01 ENCOUNTER — Emergency Department (HOSPITAL_COMMUNITY): Admission: EM | Admit: 2002-01-01 | Discharge: 2002-01-01 | Payer: Self-pay | Admitting: Emergency Medicine

## 2002-01-06 ENCOUNTER — Emergency Department (HOSPITAL_COMMUNITY): Admission: EM | Admit: 2002-01-06 | Discharge: 2002-01-06 | Payer: Self-pay | Admitting: Emergency Medicine

## 2002-01-08 ENCOUNTER — Emergency Department (HOSPITAL_COMMUNITY): Admission: EM | Admit: 2002-01-08 | Discharge: 2002-01-08 | Payer: Self-pay | Admitting: Emergency Medicine

## 2002-01-14 ENCOUNTER — Emergency Department (HOSPITAL_COMMUNITY): Admission: EM | Admit: 2002-01-14 | Discharge: 2002-01-14 | Payer: Self-pay

## 2002-01-22 ENCOUNTER — Emergency Department (HOSPITAL_COMMUNITY): Admission: EM | Admit: 2002-01-22 | Discharge: 2002-01-22 | Payer: Self-pay

## 2002-01-31 ENCOUNTER — Emergency Department (HOSPITAL_COMMUNITY): Admission: EM | Admit: 2002-01-31 | Discharge: 2002-01-31 | Payer: Self-pay | Admitting: Emergency Medicine

## 2002-02-03 ENCOUNTER — Emergency Department (HOSPITAL_COMMUNITY): Admission: EM | Admit: 2002-02-03 | Discharge: 2002-02-03 | Payer: Self-pay | Admitting: Emergency Medicine

## 2002-02-23 ENCOUNTER — Emergency Department (HOSPITAL_COMMUNITY): Admission: EM | Admit: 2002-02-23 | Discharge: 2002-02-23 | Payer: Self-pay | Admitting: Emergency Medicine

## 2002-03-08 ENCOUNTER — Encounter: Payer: Self-pay | Admitting: Emergency Medicine

## 2002-03-08 ENCOUNTER — Emergency Department (HOSPITAL_COMMUNITY): Admission: EM | Admit: 2002-03-08 | Discharge: 2002-03-09 | Payer: Self-pay | Admitting: Emergency Medicine

## 2002-03-20 ENCOUNTER — Emergency Department (HOSPITAL_COMMUNITY): Admission: EM | Admit: 2002-03-20 | Discharge: 2002-03-21 | Payer: Self-pay | Admitting: Emergency Medicine

## 2002-04-10 ENCOUNTER — Emergency Department (HOSPITAL_COMMUNITY): Admission: EM | Admit: 2002-04-10 | Discharge: 2002-04-10 | Payer: Self-pay | Admitting: Emergency Medicine

## 2002-04-14 ENCOUNTER — Emergency Department (HOSPITAL_COMMUNITY): Admission: EM | Admit: 2002-04-14 | Discharge: 2002-04-14 | Payer: Self-pay | Admitting: Emergency Medicine

## 2002-04-16 ENCOUNTER — Emergency Department (HOSPITAL_COMMUNITY): Admission: EM | Admit: 2002-04-16 | Discharge: 2002-04-16 | Payer: Self-pay | Admitting: *Deleted

## 2002-04-19 ENCOUNTER — Emergency Department (HOSPITAL_COMMUNITY): Admission: EM | Admit: 2002-04-19 | Discharge: 2002-04-19 | Payer: Self-pay | Admitting: Emergency Medicine

## 2002-04-24 ENCOUNTER — Emergency Department (HOSPITAL_COMMUNITY): Admission: EM | Admit: 2002-04-24 | Discharge: 2002-04-24 | Payer: Self-pay

## 2002-04-25 ENCOUNTER — Encounter: Payer: Self-pay | Admitting: Emergency Medicine

## 2002-04-25 ENCOUNTER — Emergency Department (HOSPITAL_COMMUNITY): Admission: EM | Admit: 2002-04-25 | Discharge: 2002-04-25 | Payer: Self-pay | Admitting: Emergency Medicine

## 2002-04-29 ENCOUNTER — Emergency Department (HOSPITAL_COMMUNITY): Admission: EM | Admit: 2002-04-29 | Discharge: 2002-04-29 | Payer: Self-pay | Admitting: Emergency Medicine

## 2002-05-02 ENCOUNTER — Emergency Department (HOSPITAL_COMMUNITY): Admission: EM | Admit: 2002-05-02 | Discharge: 2002-05-02 | Payer: Self-pay | Admitting: Emergency Medicine

## 2002-05-04 ENCOUNTER — Emergency Department (HOSPITAL_COMMUNITY): Admission: EM | Admit: 2002-05-04 | Discharge: 2002-05-04 | Payer: Self-pay | Admitting: *Deleted

## 2002-05-09 ENCOUNTER — Emergency Department (HOSPITAL_COMMUNITY): Admission: EM | Admit: 2002-05-09 | Discharge: 2002-05-09 | Payer: Self-pay

## 2002-05-13 ENCOUNTER — Emergency Department (HOSPITAL_COMMUNITY): Admission: EM | Admit: 2002-05-13 | Discharge: 2002-05-13 | Payer: Self-pay | Admitting: Emergency Medicine

## 2002-05-16 ENCOUNTER — Emergency Department (HOSPITAL_COMMUNITY): Admission: EM | Admit: 2002-05-16 | Discharge: 2002-05-16 | Payer: Self-pay | Admitting: Emergency Medicine

## 2002-05-17 ENCOUNTER — Emergency Department (HOSPITAL_COMMUNITY): Admission: EM | Admit: 2002-05-17 | Discharge: 2002-05-17 | Payer: Self-pay | Admitting: Emergency Medicine

## 2002-05-18 ENCOUNTER — Encounter: Payer: Self-pay | Admitting: Emergency Medicine

## 2002-05-19 ENCOUNTER — Encounter: Payer: Self-pay | Admitting: Internal Medicine

## 2002-05-19 ENCOUNTER — Observation Stay (HOSPITAL_COMMUNITY): Admission: EM | Admit: 2002-05-19 | Discharge: 2002-05-20 | Payer: Self-pay | Admitting: Emergency Medicine

## 2002-05-20 ENCOUNTER — Emergency Department (HOSPITAL_COMMUNITY): Admission: EM | Admit: 2002-05-20 | Discharge: 2002-05-20 | Payer: Self-pay | Admitting: Emergency Medicine

## 2002-05-22 ENCOUNTER — Emergency Department (HOSPITAL_COMMUNITY): Admission: EM | Admit: 2002-05-22 | Discharge: 2002-05-22 | Payer: Self-pay | Admitting: Emergency Medicine

## 2002-05-26 ENCOUNTER — Emergency Department (HOSPITAL_COMMUNITY): Admission: EM | Admit: 2002-05-26 | Discharge: 2002-05-26 | Payer: Self-pay

## 2002-05-27 ENCOUNTER — Emergency Department (HOSPITAL_COMMUNITY): Admission: EM | Admit: 2002-05-27 | Discharge: 2002-05-27 | Payer: Self-pay

## 2002-05-28 ENCOUNTER — Emergency Department (HOSPITAL_COMMUNITY): Admission: EM | Admit: 2002-05-28 | Discharge: 2002-05-28 | Payer: Self-pay | Admitting: *Deleted

## 2002-06-05 ENCOUNTER — Emergency Department (HOSPITAL_COMMUNITY): Admission: EM | Admit: 2002-06-05 | Discharge: 2002-06-05 | Payer: Self-pay | Admitting: Emergency Medicine

## 2002-06-05 ENCOUNTER — Ambulatory Visit (HOSPITAL_COMMUNITY): Admission: RE | Admit: 2002-06-05 | Discharge: 2002-06-05 | Payer: Self-pay | Admitting: Sports Medicine

## 2002-06-07 ENCOUNTER — Emergency Department (HOSPITAL_COMMUNITY): Admission: EM | Admit: 2002-06-07 | Discharge: 2002-06-08 | Payer: Self-pay | Admitting: Emergency Medicine

## 2002-06-11 ENCOUNTER — Emergency Department (HOSPITAL_COMMUNITY): Admission: EM | Admit: 2002-06-11 | Discharge: 2002-06-11 | Payer: Self-pay | Admitting: Emergency Medicine

## 2002-06-13 ENCOUNTER — Emergency Department (HOSPITAL_COMMUNITY): Admission: EM | Admit: 2002-06-13 | Discharge: 2002-06-14 | Payer: Self-pay | Admitting: Emergency Medicine

## 2002-06-19 ENCOUNTER — Emergency Department (HOSPITAL_COMMUNITY): Admission: EM | Admit: 2002-06-19 | Discharge: 2002-06-19 | Payer: Self-pay | Admitting: *Deleted

## 2002-06-19 ENCOUNTER — Encounter: Payer: Self-pay | Admitting: *Deleted

## 2002-06-21 ENCOUNTER — Emergency Department (HOSPITAL_COMMUNITY): Admission: EM | Admit: 2002-06-21 | Discharge: 2002-06-21 | Payer: Self-pay | Admitting: Emergency Medicine

## 2002-06-21 ENCOUNTER — Encounter: Payer: Self-pay | Admitting: Emergency Medicine

## 2002-06-24 ENCOUNTER — Emergency Department (HOSPITAL_COMMUNITY): Admission: EM | Admit: 2002-06-24 | Discharge: 2002-06-24 | Payer: Self-pay | Admitting: Emergency Medicine

## 2002-06-25 ENCOUNTER — Emergency Department (HOSPITAL_COMMUNITY): Admission: EM | Admit: 2002-06-25 | Discharge: 2002-06-25 | Payer: Self-pay | Admitting: Emergency Medicine

## 2002-06-27 ENCOUNTER — Ambulatory Visit (HOSPITAL_BASED_OUTPATIENT_CLINIC_OR_DEPARTMENT_OTHER): Admission: RE | Admit: 2002-06-27 | Discharge: 2002-06-27 | Payer: Self-pay | Admitting: Orthopedic Surgery

## 2002-06-28 ENCOUNTER — Emergency Department (HOSPITAL_COMMUNITY): Admission: EM | Admit: 2002-06-28 | Discharge: 2002-06-28 | Payer: Self-pay | Admitting: Emergency Medicine

## 2002-06-30 ENCOUNTER — Emergency Department (HOSPITAL_COMMUNITY): Admission: EM | Admit: 2002-06-30 | Discharge: 2002-06-30 | Payer: Self-pay | Admitting: Emergency Medicine

## 2002-07-10 ENCOUNTER — Emergency Department (HOSPITAL_COMMUNITY): Admission: EM | Admit: 2002-07-10 | Discharge: 2002-07-10 | Payer: Self-pay | Admitting: Emergency Medicine

## 2002-07-12 ENCOUNTER — Emergency Department (HOSPITAL_COMMUNITY): Admission: EM | Admit: 2002-07-12 | Discharge: 2002-07-12 | Payer: Self-pay | Admitting: Emergency Medicine

## 2002-07-12 ENCOUNTER — Encounter: Payer: Self-pay | Admitting: Emergency Medicine

## 2002-07-16 ENCOUNTER — Emergency Department (HOSPITAL_COMMUNITY): Admission: EM | Admit: 2002-07-16 | Discharge: 2002-07-16 | Payer: Self-pay | Admitting: Emergency Medicine

## 2002-07-18 ENCOUNTER — Emergency Department (HOSPITAL_COMMUNITY): Admission: EM | Admit: 2002-07-18 | Discharge: 2002-07-18 | Payer: Self-pay | Admitting: Emergency Medicine

## 2002-07-20 ENCOUNTER — Emergency Department (HOSPITAL_COMMUNITY): Admission: EM | Admit: 2002-07-20 | Discharge: 2002-07-20 | Payer: Self-pay | Admitting: Emergency Medicine

## 2002-07-22 ENCOUNTER — Emergency Department (HOSPITAL_COMMUNITY): Admission: EM | Admit: 2002-07-22 | Discharge: 2002-07-22 | Payer: Self-pay | Admitting: Emergency Medicine

## 2002-07-26 ENCOUNTER — Emergency Department (HOSPITAL_COMMUNITY): Admission: EM | Admit: 2002-07-26 | Discharge: 2002-07-26 | Payer: Self-pay | Admitting: Emergency Medicine

## 2002-07-29 ENCOUNTER — Encounter: Payer: Self-pay | Admitting: Emergency Medicine

## 2002-07-29 ENCOUNTER — Emergency Department (HOSPITAL_COMMUNITY): Admission: EM | Admit: 2002-07-29 | Discharge: 2002-07-29 | Payer: Self-pay | Admitting: Emergency Medicine

## 2002-07-31 ENCOUNTER — Emergency Department (HOSPITAL_COMMUNITY): Admission: EM | Admit: 2002-07-31 | Discharge: 2002-07-31 | Payer: Self-pay | Admitting: Emergency Medicine

## 2002-08-02 ENCOUNTER — Emergency Department (HOSPITAL_COMMUNITY): Admission: EM | Admit: 2002-08-02 | Discharge: 2002-08-02 | Payer: Self-pay

## 2002-08-07 ENCOUNTER — Emergency Department (HOSPITAL_COMMUNITY): Admission: EM | Admit: 2002-08-07 | Discharge: 2002-08-07 | Payer: Self-pay | Admitting: Emergency Medicine

## 2002-08-09 ENCOUNTER — Emergency Department (HOSPITAL_COMMUNITY): Admission: EM | Admit: 2002-08-09 | Discharge: 2002-08-09 | Payer: Self-pay | Admitting: Emergency Medicine

## 2002-08-12 ENCOUNTER — Emergency Department (HOSPITAL_COMMUNITY): Admission: EM | Admit: 2002-08-12 | Discharge: 2002-08-12 | Payer: Self-pay | Admitting: Emergency Medicine

## 2002-08-16 ENCOUNTER — Emergency Department (HOSPITAL_COMMUNITY): Admission: EM | Admit: 2002-08-16 | Discharge: 2002-08-16 | Payer: Self-pay | Admitting: Emergency Medicine

## 2002-08-24 ENCOUNTER — Emergency Department (HOSPITAL_COMMUNITY): Admission: EM | Admit: 2002-08-24 | Discharge: 2002-08-24 | Payer: Self-pay | Admitting: *Deleted

## 2002-08-26 ENCOUNTER — Emergency Department (HOSPITAL_COMMUNITY): Admission: EM | Admit: 2002-08-26 | Discharge: 2002-08-26 | Payer: Self-pay | Admitting: Emergency Medicine

## 2002-08-28 ENCOUNTER — Emergency Department (HOSPITAL_COMMUNITY): Admission: EM | Admit: 2002-08-28 | Discharge: 2002-08-28 | Payer: Self-pay | Admitting: Emergency Medicine

## 2002-09-02 ENCOUNTER — Emergency Department (HOSPITAL_COMMUNITY): Admission: EM | Admit: 2002-09-02 | Discharge: 2002-09-02 | Payer: Self-pay | Admitting: Emergency Medicine

## 2002-09-08 ENCOUNTER — Emergency Department (HOSPITAL_COMMUNITY): Admission: EM | Admit: 2002-09-08 | Discharge: 2002-09-08 | Payer: Self-pay | Admitting: Emergency Medicine

## 2002-09-09 ENCOUNTER — Emergency Department (HOSPITAL_COMMUNITY): Admission: EM | Admit: 2002-09-09 | Discharge: 2002-09-09 | Payer: Self-pay | Admitting: *Deleted

## 2002-09-09 ENCOUNTER — Encounter: Payer: Self-pay | Admitting: *Deleted

## 2002-09-16 ENCOUNTER — Encounter: Payer: Self-pay | Admitting: Emergency Medicine

## 2002-09-16 ENCOUNTER — Emergency Department (HOSPITAL_COMMUNITY): Admission: EM | Admit: 2002-09-16 | Discharge: 2002-09-16 | Payer: Self-pay | Admitting: Emergency Medicine

## 2002-09-25 ENCOUNTER — Emergency Department (HOSPITAL_COMMUNITY): Admission: EM | Admit: 2002-09-25 | Discharge: 2002-09-25 | Payer: Self-pay | Admitting: Emergency Medicine

## 2002-10-01 ENCOUNTER — Emergency Department (HOSPITAL_COMMUNITY): Admission: EM | Admit: 2002-10-01 | Discharge: 2002-10-01 | Payer: Self-pay | Admitting: Emergency Medicine

## 2002-10-03 ENCOUNTER — Emergency Department (HOSPITAL_COMMUNITY): Admission: EM | Admit: 2002-10-03 | Discharge: 2002-10-03 | Payer: Self-pay | Admitting: Emergency Medicine

## 2002-10-04 ENCOUNTER — Emergency Department (HOSPITAL_COMMUNITY): Admission: EM | Admit: 2002-10-04 | Discharge: 2002-10-04 | Payer: Self-pay | Admitting: Emergency Medicine

## 2002-10-09 ENCOUNTER — Encounter: Payer: Self-pay | Admitting: Emergency Medicine

## 2002-10-09 ENCOUNTER — Emergency Department (HOSPITAL_COMMUNITY): Admission: EM | Admit: 2002-10-09 | Discharge: 2002-10-09 | Payer: Self-pay | Admitting: Emergency Medicine

## 2002-10-12 ENCOUNTER — Emergency Department (HOSPITAL_COMMUNITY): Admission: EM | Admit: 2002-10-12 | Discharge: 2002-10-12 | Payer: Self-pay | Admitting: Emergency Medicine

## 2002-10-15 ENCOUNTER — Emergency Department (HOSPITAL_COMMUNITY): Admission: EM | Admit: 2002-10-15 | Discharge: 2002-10-15 | Payer: Self-pay

## 2002-10-18 ENCOUNTER — Emergency Department (HOSPITAL_COMMUNITY): Admission: EM | Admit: 2002-10-18 | Discharge: 2002-10-18 | Payer: Self-pay | Admitting: Emergency Medicine

## 2002-10-29 ENCOUNTER — Emergency Department (HOSPITAL_COMMUNITY): Admission: EM | Admit: 2002-10-29 | Discharge: 2002-10-29 | Payer: Self-pay | Admitting: Emergency Medicine

## 2002-11-02 ENCOUNTER — Emergency Department (HOSPITAL_COMMUNITY): Admission: EM | Admit: 2002-11-02 | Discharge: 2002-11-02 | Payer: Self-pay

## 2002-11-07 ENCOUNTER — Emergency Department (HOSPITAL_COMMUNITY): Admission: EM | Admit: 2002-11-07 | Discharge: 2002-11-07 | Payer: Self-pay | Admitting: Emergency Medicine

## 2002-11-16 ENCOUNTER — Emergency Department (HOSPITAL_COMMUNITY): Admission: EM | Admit: 2002-11-16 | Discharge: 2002-11-16 | Payer: Self-pay

## 2002-11-25 ENCOUNTER — Emergency Department (HOSPITAL_COMMUNITY): Admission: EM | Admit: 2002-11-25 | Discharge: 2002-11-25 | Payer: Self-pay | Admitting: Emergency Medicine

## 2002-12-05 ENCOUNTER — Emergency Department (HOSPITAL_COMMUNITY): Admission: EM | Admit: 2002-12-05 | Discharge: 2002-12-05 | Payer: Self-pay | Admitting: Emergency Medicine

## 2002-12-07 ENCOUNTER — Emergency Department (HOSPITAL_COMMUNITY): Admission: EM | Admit: 2002-12-07 | Discharge: 2002-12-07 | Payer: Self-pay | Admitting: Emergency Medicine

## 2002-12-11 ENCOUNTER — Emergency Department (HOSPITAL_COMMUNITY): Admission: EM | Admit: 2002-12-11 | Discharge: 2002-12-11 | Payer: Self-pay | Admitting: Emergency Medicine

## 2002-12-16 ENCOUNTER — Emergency Department (HOSPITAL_COMMUNITY): Admission: EM | Admit: 2002-12-16 | Discharge: 2002-12-16 | Payer: Self-pay | Admitting: *Deleted

## 2002-12-17 ENCOUNTER — Emergency Department (HOSPITAL_COMMUNITY): Admission: EM | Admit: 2002-12-17 | Discharge: 2002-12-17 | Payer: Self-pay | Admitting: Emergency Medicine

## 2002-12-29 ENCOUNTER — Emergency Department (HOSPITAL_COMMUNITY): Admission: EM | Admit: 2002-12-29 | Discharge: 2002-12-29 | Payer: Self-pay | Admitting: Emergency Medicine

## 2003-01-11 ENCOUNTER — Emergency Department (HOSPITAL_COMMUNITY): Admission: EM | Admit: 2003-01-11 | Discharge: 2003-01-11 | Payer: Self-pay | Admitting: Emergency Medicine

## 2003-01-14 ENCOUNTER — Emergency Department (HOSPITAL_COMMUNITY): Admission: EM | Admit: 2003-01-14 | Discharge: 2003-01-14 | Payer: Self-pay | Admitting: Emergency Medicine

## 2003-01-17 ENCOUNTER — Emergency Department (HOSPITAL_COMMUNITY): Admission: EM | Admit: 2003-01-17 | Discharge: 2003-01-17 | Payer: Self-pay | Admitting: Emergency Medicine

## 2003-01-25 ENCOUNTER — Emergency Department (HOSPITAL_COMMUNITY): Admission: EM | Admit: 2003-01-25 | Discharge: 2003-01-25 | Payer: Self-pay | Admitting: Emergency Medicine

## 2003-02-11 ENCOUNTER — Emergency Department (HOSPITAL_COMMUNITY): Admission: EM | Admit: 2003-02-11 | Discharge: 2003-02-11 | Payer: Self-pay | Admitting: Emergency Medicine

## 2003-02-20 ENCOUNTER — Emergency Department (HOSPITAL_COMMUNITY): Admission: EM | Admit: 2003-02-20 | Discharge: 2003-02-20 | Payer: Self-pay | Admitting: Emergency Medicine

## 2003-02-24 ENCOUNTER — Emergency Department (HOSPITAL_COMMUNITY): Admission: EM | Admit: 2003-02-24 | Discharge: 2003-02-24 | Payer: Self-pay | Admitting: Emergency Medicine

## 2003-02-26 ENCOUNTER — Emergency Department (HOSPITAL_COMMUNITY): Admission: EM | Admit: 2003-02-26 | Discharge: 2003-02-26 | Payer: Self-pay | Admitting: Emergency Medicine

## 2003-03-13 ENCOUNTER — Emergency Department (HOSPITAL_COMMUNITY): Admission: EM | Admit: 2003-03-13 | Discharge: 2003-03-14 | Payer: Self-pay | Admitting: Emergency Medicine

## 2003-03-27 ENCOUNTER — Inpatient Hospital Stay (HOSPITAL_COMMUNITY): Admission: EM | Admit: 2003-03-27 | Discharge: 2003-03-28 | Payer: Self-pay | Admitting: Emergency Medicine

## 2003-04-10 ENCOUNTER — Emergency Department (HOSPITAL_COMMUNITY): Admission: EM | Admit: 2003-04-10 | Discharge: 2003-04-10 | Payer: Self-pay | Admitting: Emergency Medicine

## 2003-04-12 ENCOUNTER — Emergency Department (HOSPITAL_COMMUNITY): Admission: EM | Admit: 2003-04-12 | Discharge: 2003-04-12 | Payer: Self-pay | Admitting: Emergency Medicine

## 2003-04-15 ENCOUNTER — Emergency Department (HOSPITAL_COMMUNITY): Admission: EM | Admit: 2003-04-15 | Discharge: 2003-04-15 | Payer: Self-pay | Admitting: Emergency Medicine

## 2003-04-30 ENCOUNTER — Emergency Department (HOSPITAL_COMMUNITY): Admission: EM | Admit: 2003-04-30 | Discharge: 2003-04-30 | Payer: Self-pay | Admitting: Emergency Medicine

## 2003-05-13 ENCOUNTER — Emergency Department (HOSPITAL_COMMUNITY): Admission: EM | Admit: 2003-05-13 | Discharge: 2003-05-13 | Payer: Self-pay | Admitting: Emergency Medicine

## 2003-05-16 ENCOUNTER — Emergency Department (HOSPITAL_COMMUNITY): Admission: EM | Admit: 2003-05-16 | Discharge: 2003-05-16 | Payer: Self-pay | Admitting: Emergency Medicine

## 2003-05-23 ENCOUNTER — Emergency Department (HOSPITAL_COMMUNITY): Admission: EM | Admit: 2003-05-23 | Discharge: 2003-05-23 | Payer: Self-pay | Admitting: Emergency Medicine

## 2003-05-29 ENCOUNTER — Emergency Department (HOSPITAL_COMMUNITY): Admission: EM | Admit: 2003-05-29 | Discharge: 2003-05-29 | Payer: Self-pay | Admitting: Emergency Medicine

## 2003-06-04 ENCOUNTER — Emergency Department (HOSPITAL_COMMUNITY): Admission: EM | Admit: 2003-06-04 | Discharge: 2003-06-04 | Payer: Self-pay | Admitting: Emergency Medicine

## 2003-06-05 ENCOUNTER — Emergency Department (HOSPITAL_COMMUNITY): Admission: EM | Admit: 2003-06-05 | Discharge: 2003-06-05 | Payer: Self-pay | Admitting: Emergency Medicine

## 2003-06-17 ENCOUNTER — Emergency Department (HOSPITAL_COMMUNITY): Admission: EM | Admit: 2003-06-17 | Discharge: 2003-06-17 | Payer: Self-pay | Admitting: Emergency Medicine

## 2003-06-18 ENCOUNTER — Inpatient Hospital Stay (HOSPITAL_COMMUNITY): Admission: EM | Admit: 2003-06-18 | Discharge: 2003-06-24 | Payer: Self-pay | Admitting: Emergency Medicine

## 2003-06-29 ENCOUNTER — Emergency Department (HOSPITAL_COMMUNITY): Admission: EM | Admit: 2003-06-29 | Discharge: 2003-06-29 | Payer: Self-pay | Admitting: Emergency Medicine

## 2003-07-07 ENCOUNTER — Emergency Department (HOSPITAL_COMMUNITY): Admission: EM | Admit: 2003-07-07 | Discharge: 2003-07-07 | Payer: Self-pay | Admitting: Emergency Medicine

## 2003-07-08 ENCOUNTER — Emergency Department (HOSPITAL_COMMUNITY): Admission: EM | Admit: 2003-07-08 | Discharge: 2003-07-08 | Payer: Self-pay | Admitting: Emergency Medicine

## 2003-07-10 ENCOUNTER — Emergency Department (HOSPITAL_COMMUNITY): Admission: EM | Admit: 2003-07-10 | Discharge: 2003-07-11 | Payer: Self-pay | Admitting: Emergency Medicine

## 2003-07-12 ENCOUNTER — Emergency Department (HOSPITAL_COMMUNITY): Admission: EM | Admit: 2003-07-12 | Discharge: 2003-07-12 | Payer: Self-pay | Admitting: Emergency Medicine

## 2003-07-16 ENCOUNTER — Emergency Department (HOSPITAL_COMMUNITY): Admission: EM | Admit: 2003-07-16 | Discharge: 2003-07-16 | Payer: Self-pay | Admitting: Emergency Medicine

## 2003-07-20 ENCOUNTER — Emergency Department (HOSPITAL_COMMUNITY): Admission: EM | Admit: 2003-07-20 | Discharge: 2003-07-21 | Payer: Self-pay | Admitting: Emergency Medicine

## 2003-08-04 ENCOUNTER — Emergency Department (HOSPITAL_COMMUNITY): Admission: EM | Admit: 2003-08-04 | Discharge: 2003-08-04 | Payer: Self-pay | Admitting: Emergency Medicine

## 2003-08-13 ENCOUNTER — Emergency Department (HOSPITAL_COMMUNITY): Admission: EM | Admit: 2003-08-13 | Discharge: 2003-08-13 | Payer: Self-pay | Admitting: Emergency Medicine

## 2003-09-11 ENCOUNTER — Emergency Department (HOSPITAL_COMMUNITY): Admission: EM | Admit: 2003-09-11 | Discharge: 2003-09-11 | Payer: Self-pay | Admitting: Emergency Medicine

## 2003-09-15 ENCOUNTER — Emergency Department (HOSPITAL_COMMUNITY): Admission: EM | Admit: 2003-09-15 | Discharge: 2003-09-15 | Payer: Self-pay | Admitting: Emergency Medicine

## 2003-09-18 ENCOUNTER — Emergency Department (HOSPITAL_COMMUNITY): Admission: EM | Admit: 2003-09-18 | Discharge: 2003-09-18 | Payer: Self-pay | Admitting: Emergency Medicine

## 2003-09-24 ENCOUNTER — Emergency Department (HOSPITAL_COMMUNITY): Admission: EM | Admit: 2003-09-24 | Discharge: 2003-09-24 | Payer: Self-pay | Admitting: Emergency Medicine

## 2003-10-07 ENCOUNTER — Emergency Department (HOSPITAL_COMMUNITY): Admission: EM | Admit: 2003-10-07 | Discharge: 2003-10-07 | Payer: Self-pay | Admitting: Emergency Medicine

## 2003-10-23 ENCOUNTER — Emergency Department (HOSPITAL_COMMUNITY): Admission: EM | Admit: 2003-10-23 | Discharge: 2003-10-23 | Payer: Self-pay | Admitting: Emergency Medicine

## 2003-10-27 ENCOUNTER — Emergency Department (HOSPITAL_COMMUNITY): Admission: EM | Admit: 2003-10-27 | Discharge: 2003-10-27 | Payer: Self-pay | Admitting: Emergency Medicine

## 2003-10-30 ENCOUNTER — Emergency Department (HOSPITAL_COMMUNITY): Admission: EM | Admit: 2003-10-30 | Discharge: 2003-10-30 | Payer: Self-pay | Admitting: Emergency Medicine

## 2003-11-04 ENCOUNTER — Emergency Department (HOSPITAL_COMMUNITY): Admission: EM | Admit: 2003-11-04 | Discharge: 2003-11-04 | Payer: Self-pay | Admitting: Emergency Medicine

## 2003-11-07 ENCOUNTER — Emergency Department (HOSPITAL_COMMUNITY): Admission: EM | Admit: 2003-11-07 | Discharge: 2003-11-07 | Payer: Self-pay | Admitting: Emergency Medicine

## 2003-11-10 ENCOUNTER — Emergency Department (HOSPITAL_COMMUNITY): Admission: EM | Admit: 2003-11-10 | Discharge: 2003-11-10 | Payer: Self-pay | Admitting: Emergency Medicine

## 2003-11-19 ENCOUNTER — Emergency Department (HOSPITAL_COMMUNITY): Admission: EM | Admit: 2003-11-19 | Discharge: 2003-11-19 | Payer: Self-pay | Admitting: Emergency Medicine

## 2003-11-22 ENCOUNTER — Emergency Department (HOSPITAL_COMMUNITY): Admission: EM | Admit: 2003-11-22 | Discharge: 2003-11-22 | Payer: Self-pay | Admitting: Emergency Medicine

## 2003-11-27 ENCOUNTER — Emergency Department (HOSPITAL_COMMUNITY): Admission: EM | Admit: 2003-11-27 | Discharge: 2003-11-27 | Payer: Self-pay | Admitting: Emergency Medicine

## 2003-12-01 ENCOUNTER — Emergency Department (HOSPITAL_COMMUNITY): Admission: EM | Admit: 2003-12-01 | Discharge: 2003-12-01 | Payer: Self-pay | Admitting: Emergency Medicine

## 2003-12-05 ENCOUNTER — Encounter: Admission: RE | Admit: 2003-12-05 | Discharge: 2003-12-05 | Payer: Self-pay | Admitting: General Surgery

## 2003-12-08 ENCOUNTER — Emergency Department (HOSPITAL_COMMUNITY): Admission: EM | Admit: 2003-12-08 | Discharge: 2003-12-08 | Payer: Self-pay | Admitting: Emergency Medicine

## 2003-12-18 ENCOUNTER — Emergency Department (HOSPITAL_COMMUNITY): Admission: EM | Admit: 2003-12-18 | Discharge: 2003-12-18 | Payer: Self-pay | Admitting: Emergency Medicine

## 2003-12-25 ENCOUNTER — Emergency Department (HOSPITAL_COMMUNITY): Admission: EM | Admit: 2003-12-25 | Discharge: 2003-12-25 | Payer: Self-pay | Admitting: Emergency Medicine

## 2004-01-02 ENCOUNTER — Emergency Department (HOSPITAL_COMMUNITY): Admission: EM | Admit: 2004-01-02 | Discharge: 2004-01-02 | Payer: Self-pay | Admitting: Emergency Medicine

## 2004-01-03 ENCOUNTER — Emergency Department (HOSPITAL_COMMUNITY): Admission: EM | Admit: 2004-01-03 | Discharge: 2004-01-03 | Payer: Self-pay | Admitting: Emergency Medicine

## 2004-01-07 ENCOUNTER — Emergency Department (HOSPITAL_COMMUNITY): Admission: EM | Admit: 2004-01-07 | Discharge: 2004-01-07 | Payer: Self-pay | Admitting: Emergency Medicine

## 2004-01-16 ENCOUNTER — Emergency Department (HOSPITAL_COMMUNITY): Admission: EM | Admit: 2004-01-16 | Discharge: 2004-01-16 | Payer: Self-pay | Admitting: Emergency Medicine

## 2004-01-23 ENCOUNTER — Emergency Department (HOSPITAL_COMMUNITY): Admission: EM | Admit: 2004-01-23 | Discharge: 2004-01-23 | Payer: Self-pay | Admitting: Emergency Medicine

## 2004-02-02 ENCOUNTER — Emergency Department (HOSPITAL_COMMUNITY): Admission: EM | Admit: 2004-02-02 | Discharge: 2004-02-02 | Payer: Self-pay | Admitting: Emergency Medicine

## 2004-02-05 ENCOUNTER — Emergency Department (HOSPITAL_COMMUNITY): Admission: EM | Admit: 2004-02-05 | Discharge: 2004-02-05 | Payer: Self-pay | Admitting: Emergency Medicine

## 2004-02-10 ENCOUNTER — Emergency Department (HOSPITAL_COMMUNITY): Admission: EM | Admit: 2004-02-10 | Discharge: 2004-02-10 | Payer: Self-pay | Admitting: Emergency Medicine

## 2004-03-02 ENCOUNTER — Emergency Department (HOSPITAL_COMMUNITY): Admission: EM | Admit: 2004-03-02 | Discharge: 2004-03-02 | Payer: Self-pay | Admitting: Emergency Medicine

## 2004-03-06 ENCOUNTER — Emergency Department (HOSPITAL_COMMUNITY): Admission: EM | Admit: 2004-03-06 | Discharge: 2004-03-06 | Payer: Self-pay | Admitting: Emergency Medicine

## 2004-03-08 ENCOUNTER — Emergency Department (HOSPITAL_COMMUNITY): Admission: EM | Admit: 2004-03-08 | Discharge: 2004-03-08 | Payer: Self-pay | Admitting: Emergency Medicine

## 2004-03-16 ENCOUNTER — Emergency Department (HOSPITAL_COMMUNITY): Admission: EM | Admit: 2004-03-16 | Discharge: 2004-03-16 | Payer: Self-pay | Admitting: Emergency Medicine

## 2004-03-24 ENCOUNTER — Emergency Department (HOSPITAL_COMMUNITY): Admission: EM | Admit: 2004-03-24 | Discharge: 2004-03-24 | Payer: Self-pay | Admitting: Emergency Medicine

## 2004-03-28 ENCOUNTER — Emergency Department (HOSPITAL_COMMUNITY): Admission: EM | Admit: 2004-03-28 | Discharge: 2004-03-28 | Payer: Self-pay | Admitting: Emergency Medicine

## 2004-04-16 ENCOUNTER — Emergency Department (HOSPITAL_COMMUNITY): Admission: EM | Admit: 2004-04-16 | Discharge: 2004-04-16 | Payer: Self-pay | Admitting: Emergency Medicine

## 2004-04-24 ENCOUNTER — Emergency Department (HOSPITAL_COMMUNITY): Admission: EM | Admit: 2004-04-24 | Discharge: 2004-04-24 | Payer: Self-pay | Admitting: Emergency Medicine

## 2004-04-26 ENCOUNTER — Emergency Department (HOSPITAL_COMMUNITY): Admission: EM | Admit: 2004-04-26 | Discharge: 2004-04-26 | Payer: Self-pay | Admitting: *Deleted

## 2004-05-01 ENCOUNTER — Emergency Department (HOSPITAL_COMMUNITY): Admission: EM | Admit: 2004-05-01 | Discharge: 2004-05-01 | Payer: Self-pay | Admitting: Emergency Medicine

## 2004-05-08 ENCOUNTER — Emergency Department (HOSPITAL_COMMUNITY): Admission: EM | Admit: 2004-05-08 | Discharge: 2004-05-08 | Payer: Self-pay | Admitting: Emergency Medicine

## 2004-05-13 ENCOUNTER — Emergency Department (HOSPITAL_COMMUNITY): Admission: EM | Admit: 2004-05-13 | Discharge: 2004-05-13 | Payer: Self-pay | Admitting: Podiatry

## 2004-05-16 ENCOUNTER — Emergency Department (HOSPITAL_COMMUNITY): Admission: EM | Admit: 2004-05-16 | Discharge: 2004-05-16 | Payer: Self-pay | Admitting: Emergency Medicine

## 2004-05-21 ENCOUNTER — Emergency Department (HOSPITAL_COMMUNITY): Admission: EM | Admit: 2004-05-21 | Discharge: 2004-05-21 | Payer: Self-pay | Admitting: Emergency Medicine

## 2004-05-22 ENCOUNTER — Emergency Department (HOSPITAL_COMMUNITY): Admission: EM | Admit: 2004-05-22 | Discharge: 2004-05-22 | Payer: Self-pay | Admitting: Emergency Medicine

## 2004-06-03 ENCOUNTER — Emergency Department (HOSPITAL_COMMUNITY): Admission: EM | Admit: 2004-06-03 | Discharge: 2004-06-03 | Payer: Self-pay | Admitting: Emergency Medicine

## 2004-06-05 ENCOUNTER — Emergency Department (HOSPITAL_COMMUNITY): Admission: EM | Admit: 2004-06-05 | Discharge: 2004-06-05 | Payer: Self-pay | Admitting: Emergency Medicine

## 2004-06-25 ENCOUNTER — Emergency Department (HOSPITAL_COMMUNITY): Admission: EM | Admit: 2004-06-25 | Discharge: 2004-06-25 | Payer: Self-pay | Admitting: Emergency Medicine

## 2004-06-27 ENCOUNTER — Emergency Department (HOSPITAL_COMMUNITY): Admission: EM | Admit: 2004-06-27 | Discharge: 2004-06-28 | Payer: Self-pay | Admitting: Emergency Medicine

## 2004-06-28 ENCOUNTER — Emergency Department (HOSPITAL_COMMUNITY): Admission: EM | Admit: 2004-06-28 | Discharge: 2004-06-28 | Payer: Self-pay | Admitting: Emergency Medicine

## 2004-07-12 ENCOUNTER — Emergency Department (HOSPITAL_COMMUNITY): Admission: EM | Admit: 2004-07-12 | Discharge: 2004-07-12 | Payer: Self-pay | Admitting: Emergency Medicine

## 2004-07-17 ENCOUNTER — Emergency Department (HOSPITAL_COMMUNITY): Admission: EM | Admit: 2004-07-17 | Discharge: 2004-07-17 | Payer: Self-pay | Admitting: *Deleted

## 2004-07-25 ENCOUNTER — Emergency Department (HOSPITAL_COMMUNITY): Admission: EM | Admit: 2004-07-25 | Discharge: 2004-07-25 | Payer: Self-pay | Admitting: Emergency Medicine

## 2004-07-26 ENCOUNTER — Emergency Department (HOSPITAL_COMMUNITY): Admission: EM | Admit: 2004-07-26 | Discharge: 2004-07-26 | Payer: Self-pay | Admitting: *Deleted

## 2004-07-27 ENCOUNTER — Emergency Department (HOSPITAL_COMMUNITY): Admission: EM | Admit: 2004-07-27 | Discharge: 2004-07-27 | Payer: Self-pay | Admitting: Emergency Medicine

## 2004-07-30 ENCOUNTER — Emergency Department (HOSPITAL_COMMUNITY): Admission: EM | Admit: 2004-07-30 | Discharge: 2004-07-30 | Payer: Self-pay | Admitting: Emergency Medicine

## 2004-08-04 ENCOUNTER — Emergency Department (HOSPITAL_COMMUNITY): Admission: EM | Admit: 2004-08-04 | Discharge: 2004-08-04 | Payer: Self-pay | Admitting: Emergency Medicine

## 2004-09-05 ENCOUNTER — Emergency Department (HOSPITAL_COMMUNITY): Admission: EM | Admit: 2004-09-05 | Discharge: 2004-09-05 | Payer: Self-pay | Admitting: Emergency Medicine

## 2004-09-25 ENCOUNTER — Emergency Department (HOSPITAL_COMMUNITY): Admission: EM | Admit: 2004-09-25 | Discharge: 2004-09-25 | Payer: Self-pay | Admitting: Emergency Medicine

## 2004-12-12 ENCOUNTER — Emergency Department (HOSPITAL_COMMUNITY): Admission: EM | Admit: 2004-12-12 | Discharge: 2004-12-12 | Payer: Self-pay | Admitting: Emergency Medicine

## 2004-12-17 ENCOUNTER — Emergency Department (HOSPITAL_COMMUNITY): Admission: EM | Admit: 2004-12-17 | Discharge: 2004-12-17 | Payer: Self-pay | Admitting: Emergency Medicine

## 2004-12-18 ENCOUNTER — Emergency Department (HOSPITAL_COMMUNITY): Admission: EM | Admit: 2004-12-18 | Discharge: 2004-12-18 | Payer: Self-pay | Admitting: Emergency Medicine

## 2005-01-29 ENCOUNTER — Emergency Department (HOSPITAL_COMMUNITY): Admission: EM | Admit: 2005-01-29 | Discharge: 2005-01-29 | Payer: Self-pay | Admitting: Emergency Medicine

## 2005-02-19 ENCOUNTER — Emergency Department (HOSPITAL_COMMUNITY): Admission: EM | Admit: 2005-02-19 | Discharge: 2005-02-19 | Payer: Self-pay | Admitting: Emergency Medicine

## 2005-04-23 ENCOUNTER — Emergency Department (HOSPITAL_COMMUNITY): Admission: EM | Admit: 2005-04-23 | Discharge: 2005-04-23 | Payer: Self-pay | Admitting: Emergency Medicine

## 2005-05-04 ENCOUNTER — Emergency Department (HOSPITAL_COMMUNITY): Admission: EM | Admit: 2005-05-04 | Discharge: 2005-05-04 | Payer: Self-pay | Admitting: Emergency Medicine

## 2005-05-21 ENCOUNTER — Emergency Department (HOSPITAL_COMMUNITY): Admission: EM | Admit: 2005-05-21 | Discharge: 2005-05-21 | Payer: Self-pay | Admitting: Emergency Medicine

## 2005-05-30 ENCOUNTER — Emergency Department (HOSPITAL_COMMUNITY): Admission: EM | Admit: 2005-05-30 | Discharge: 2005-05-31 | Payer: Self-pay | Admitting: Emergency Medicine

## 2005-06-16 ENCOUNTER — Emergency Department (HOSPITAL_COMMUNITY): Admission: EM | Admit: 2005-06-16 | Discharge: 2005-06-16 | Payer: Self-pay | Admitting: Emergency Medicine

## 2005-06-20 ENCOUNTER — Emergency Department (HOSPITAL_COMMUNITY): Admission: EM | Admit: 2005-06-20 | Discharge: 2005-06-20 | Payer: Self-pay | Admitting: Emergency Medicine

## 2005-06-26 ENCOUNTER — Emergency Department (HOSPITAL_COMMUNITY): Admission: EM | Admit: 2005-06-26 | Discharge: 2005-06-26 | Payer: Self-pay | Admitting: Emergency Medicine

## 2005-08-19 ENCOUNTER — Emergency Department (HOSPITAL_COMMUNITY): Admission: EM | Admit: 2005-08-19 | Discharge: 2005-08-19 | Payer: Self-pay | Admitting: Emergency Medicine

## 2005-09-26 ENCOUNTER — Emergency Department (HOSPITAL_COMMUNITY): Admission: EM | Admit: 2005-09-26 | Discharge: 2005-09-26 | Payer: Self-pay | Admitting: Emergency Medicine

## 2005-12-14 ENCOUNTER — Encounter: Admission: RE | Admit: 2005-12-14 | Discharge: 2005-12-14 | Payer: Self-pay | Admitting: Family Medicine

## 2006-01-25 ENCOUNTER — Ambulatory Visit (HOSPITAL_COMMUNITY): Admission: RE | Admit: 2006-01-25 | Discharge: 2006-01-25 | Payer: Self-pay | Admitting: Gastroenterology

## 2006-02-14 ENCOUNTER — Emergency Department (HOSPITAL_COMMUNITY): Admission: EM | Admit: 2006-02-14 | Discharge: 2006-02-14 | Payer: Self-pay | Admitting: Emergency Medicine

## 2006-03-08 ENCOUNTER — Emergency Department (HOSPITAL_COMMUNITY): Admission: EM | Admit: 2006-03-08 | Discharge: 2006-03-09 | Payer: Self-pay | Admitting: Emergency Medicine

## 2006-03-17 ENCOUNTER — Emergency Department (HOSPITAL_COMMUNITY): Admission: EM | Admit: 2006-03-17 | Discharge: 2006-03-17 | Payer: Self-pay | Admitting: Emergency Medicine

## 2006-04-03 ENCOUNTER — Emergency Department (HOSPITAL_COMMUNITY): Admission: EM | Admit: 2006-04-03 | Discharge: 2006-04-03 | Payer: Self-pay | Admitting: Emergency Medicine

## 2006-06-01 ENCOUNTER — Ambulatory Visit (HOSPITAL_COMMUNITY): Admission: RE | Admit: 2006-06-01 | Discharge: 2006-06-01 | Payer: Self-pay | Admitting: *Deleted

## 2006-11-26 ENCOUNTER — Emergency Department (HOSPITAL_COMMUNITY): Admission: EM | Admit: 2006-11-26 | Discharge: 2006-11-26 | Payer: Self-pay | Admitting: Emergency Medicine

## 2006-11-27 ENCOUNTER — Inpatient Hospital Stay (HOSPITAL_COMMUNITY): Admission: EM | Admit: 2006-11-27 | Discharge: 2006-11-30 | Payer: Self-pay | Admitting: Emergency Medicine

## 2006-12-04 ENCOUNTER — Inpatient Hospital Stay (HOSPITAL_COMMUNITY): Admission: EM | Admit: 2006-12-04 | Discharge: 2006-12-07 | Payer: Self-pay | Admitting: Emergency Medicine

## 2006-12-04 ENCOUNTER — Ambulatory Visit: Payer: Self-pay | Admitting: Internal Medicine

## 2006-12-15 ENCOUNTER — Ambulatory Visit (HOSPITAL_COMMUNITY): Admission: RE | Admit: 2006-12-15 | Discharge: 2006-12-15 | Payer: Self-pay | Admitting: Family Medicine

## 2006-12-18 ENCOUNTER — Encounter: Admission: RE | Admit: 2006-12-18 | Discharge: 2006-12-18 | Payer: Self-pay | Admitting: Family Medicine

## 2007-04-17 ENCOUNTER — Emergency Department (HOSPITAL_COMMUNITY): Admission: EM | Admit: 2007-04-17 | Discharge: 2007-04-17 | Payer: Self-pay | Admitting: Emergency Medicine

## 2008-02-21 ENCOUNTER — Emergency Department (HOSPITAL_COMMUNITY): Admission: EM | Admit: 2008-02-21 | Discharge: 2008-02-21 | Payer: Self-pay | Admitting: Emergency Medicine

## 2009-01-07 ENCOUNTER — Emergency Department (HOSPITAL_COMMUNITY): Admission: EM | Admit: 2009-01-07 | Discharge: 2009-01-08 | Payer: Self-pay | Admitting: Emergency Medicine

## 2009-06-09 ENCOUNTER — Inpatient Hospital Stay (HOSPITAL_COMMUNITY)
Admission: EM | Admit: 2009-06-09 | Discharge: 2009-06-17 | Payer: Self-pay | Source: Home / Self Care | Admitting: Emergency Medicine

## 2009-06-26 ENCOUNTER — Inpatient Hospital Stay (HOSPITAL_COMMUNITY): Admission: EM | Admit: 2009-06-26 | Discharge: 2009-07-02 | Payer: Self-pay | Admitting: Emergency Medicine

## 2009-07-30 ENCOUNTER — Inpatient Hospital Stay (HOSPITAL_COMMUNITY): Admission: EM | Admit: 2009-07-30 | Discharge: 2009-08-06 | Payer: Self-pay | Admitting: Emergency Medicine

## 2009-08-10 ENCOUNTER — Inpatient Hospital Stay (HOSPITAL_COMMUNITY): Admission: EM | Admit: 2009-08-10 | Discharge: 2009-08-14 | Payer: Self-pay | Admitting: Emergency Medicine

## 2009-08-31 ENCOUNTER — Inpatient Hospital Stay (HOSPITAL_COMMUNITY)
Admission: EM | Admit: 2009-08-31 | Discharge: 2009-09-06 | Payer: Self-pay | Source: Home / Self Care | Admitting: Emergency Medicine

## 2009-10-06 ENCOUNTER — Emergency Department (HOSPITAL_COMMUNITY): Admission: EM | Admit: 2009-10-06 | Discharge: 2009-10-06 | Payer: Self-pay | Admitting: Emergency Medicine

## 2010-02-28 ENCOUNTER — Encounter: Payer: Self-pay | Admitting: Family Medicine

## 2010-03-20 ENCOUNTER — Emergency Department (HOSPITAL_COMMUNITY): Payer: 59

## 2010-03-20 ENCOUNTER — Emergency Department (HOSPITAL_COMMUNITY)
Admission: EM | Admit: 2010-03-20 | Discharge: 2010-03-20 | Disposition: A | Discharge status: Home / Self Care | Payer: 59 | Attending: Emergency Medicine | Admitting: Emergency Medicine

## 2010-03-20 DIAGNOSIS — G20A1 Parkinson's disease without dyskinesia, without mention of fluctuations: Secondary | ICD-10-CM | POA: Insufficient documentation

## 2010-03-20 DIAGNOSIS — G809 Cerebral palsy, unspecified: Secondary | ICD-10-CM | POA: Insufficient documentation

## 2010-03-20 DIAGNOSIS — R509 Fever, unspecified: Secondary | ICD-10-CM | POA: Insufficient documentation

## 2010-03-20 DIAGNOSIS — E119 Type 2 diabetes mellitus without complications: Secondary | ICD-10-CM | POA: Insufficient documentation

## 2010-03-20 DIAGNOSIS — E860 Dehydration: Secondary | ICD-10-CM | POA: Insufficient documentation

## 2010-03-20 DIAGNOSIS — I10 Essential (primary) hypertension: Secondary | ICD-10-CM | POA: Insufficient documentation

## 2010-03-20 DIAGNOSIS — G20 Parkinson's disease: Secondary | ICD-10-CM | POA: Insufficient documentation

## 2010-03-20 LAB — URINE MICROSCOPIC-ADD ON

## 2010-03-20 LAB — COMPREHENSIVE METABOLIC PANEL
ALT: 38 U/L — ABNORMAL HIGH (ref 0–35)
CO2: 25 mEq/L (ref 19–32)
Calcium: 9.3 mg/dL (ref 8.4–10.5)
Chloride: 103 mEq/L (ref 96–112)
Creatinine, Ser: 0.98 mg/dL (ref 0.4–1.2)
GFR calc Af Amer: >60 mL/min (ref >60)
Total Protein: 7.6 g/dL (ref 6.0–8.3)

## 2010-03-20 LAB — CBC
HCT: 41.5 % (ref 36.0–46.0)
Hemoglobin: 13.2 g/dL (ref 12.0–15.0)
MCH: 27 pg (ref 26.0–34.0)
MCV: 84.9 fL (ref 78.0–100.0)
RDW: 16.1 % — ABNORMAL HIGH (ref 11.5–15.5)

## 2010-03-20 LAB — URINALYSIS, ROUTINE W REFLEX MICROSCOPIC
Hgb urine dipstick: NEGATIVE
Ketones, ur: NEGATIVE mg/dL
Leukocytes, UA: NEGATIVE
Specific Gravity, Urine: 1.02 (ref 1.005–1.030)
Urine Glucose, Fasting: 500 mg/dL — AB
Urobilinogen, UA: 0.2 mg/dL (ref 0.0–1.0)

## 2010-03-20 LAB — DIFFERENTIAL
Lymphocytes Relative: 33 % (ref 12–46)
Lymphs Abs: 1.4 10*3/uL (ref 0.7–4.0)
Neutro Abs: 2.3 10*3/uL (ref 1.7–7.7)

## 2010-03-21 LAB — URINE CULTURE
Colony Count: NO GROWTH
Culture: NO GROWTH

## 2010-03-31 ENCOUNTER — Emergency Department (HOSPITAL_COMMUNITY): Payer: 59

## 2010-03-31 ENCOUNTER — Emergency Department (HOSPITAL_COMMUNITY)
Admission: EM | Admit: 2010-03-31 | Discharge: 2010-03-31 | Disposition: A | Discharge status: Home / Self Care | Payer: 59 | Attending: Emergency Medicine | Admitting: Emergency Medicine

## 2010-03-31 DIAGNOSIS — R0682 Tachypnea, not elsewhere classified: Secondary | ICD-10-CM | POA: Insufficient documentation

## 2010-03-31 DIAGNOSIS — L89609 Pressure ulcer of unspecified heel, unspecified stage: Secondary | ICD-10-CM | POA: Insufficient documentation

## 2010-03-31 DIAGNOSIS — I10 Essential (primary) hypertension: Secondary | ICD-10-CM | POA: Insufficient documentation

## 2010-03-31 DIAGNOSIS — L8990 Pressure ulcer of unspecified site, unspecified stage: Secondary | ICD-10-CM | POA: Insufficient documentation

## 2010-03-31 DIAGNOSIS — R4182 Altered mental status, unspecified: Secondary | ICD-10-CM | POA: Insufficient documentation

## 2010-03-31 DIAGNOSIS — E119 Type 2 diabetes mellitus without complications: Secondary | ICD-10-CM | POA: Insufficient documentation

## 2010-03-31 DIAGNOSIS — Z931 Gastrostomy status: Secondary | ICD-10-CM | POA: Insufficient documentation

## 2010-03-31 DIAGNOSIS — G20 Parkinson's disease: Secondary | ICD-10-CM | POA: Insufficient documentation

## 2010-03-31 DIAGNOSIS — G20A1 Parkinson's disease without dyskinesia, without mention of fluctuations: Secondary | ICD-10-CM | POA: Insufficient documentation

## 2010-03-31 DIAGNOSIS — G809 Cerebral palsy, unspecified: Secondary | ICD-10-CM | POA: Insufficient documentation

## 2010-03-31 DIAGNOSIS — N390 Urinary tract infection, site not specified: Secondary | ICD-10-CM | POA: Insufficient documentation

## 2010-03-31 LAB — CBC
MCH: 26.6 pg (ref 26.0–34.0)
MCHC: 30.8 g/dL (ref 30.0–36.0)
Platelets: 203 10*3/uL (ref 150–400)
RBC: 4.51 MIL/uL (ref 3.87–5.11)

## 2010-03-31 LAB — POCT I-STAT, CHEM 8
BUN: 30 mg/dL — ABNORMAL HIGH (ref 6–23)
Calcium, Ion: 1.16 mmol/L (ref 1.12–1.32)
Hemoglobin: 12.9 g/dL (ref 12.0–15.0)
Sodium: 138 mEq/L (ref 135–145)
TCO2: 26 mmol/L (ref 0–100)

## 2010-03-31 LAB — URINALYSIS, ROUTINE W REFLEX MICROSCOPIC
Ketones, ur: NEGATIVE mg/dL
Nitrite: NEGATIVE
Protein, ur: 100 mg/dL — AB
Urobilinogen, UA: 1 mg/dL (ref 0.0–1.0)

## 2010-03-31 LAB — DIFFERENTIAL
Basophils Absolute: 0 10*3/uL (ref 0.0–0.1)
Basophils Relative: 0 % (ref 0–1)
Eosinophils Absolute: 0.1 10*3/uL (ref 0.0–0.7)
Monocytes Relative: 9 % (ref 3–12)
Neutrophils Relative %: 63 % (ref 43–77)

## 2010-03-31 LAB — URINE MICROSCOPIC-ADD ON

## 2010-04-02 LAB — URINE CULTURE: Colony Count: >=100000

## 2010-04-23 LAB — CBC
HCT: 35.4 % — ABNORMAL LOW (ref 36.0–46.0)
Hemoglobin: 11.8 g/dL — ABNORMAL LOW (ref 12.0–15.0)
MCH: 28.2 pg (ref 26.0–34.0)
MCHC: 33.3 g/dL (ref 30.0–36.0)
MCV: 84.6 fL (ref 78.0–100.0)
Platelets: 210 10*3/uL (ref 150–400)
RBC: 4.19 MIL/uL (ref 3.87–5.11)
RDW: 17.7 % — ABNORMAL HIGH (ref 11.5–15.5)
WBC: 6.6 10*3/uL (ref 4.0–10.5)

## 2010-04-23 LAB — URINALYSIS, ROUTINE W REFLEX MICROSCOPIC
Bilirubin Urine: NEGATIVE
Glucose, UA: NEGATIVE mg/dL
Ketones, ur: NEGATIVE mg/dL
Nitrite: POSITIVE — AB
Protein, ur: 30 mg/dL — AB
Specific Gravity, Urine: 1.013 (ref 1.005–1.030)
Urobilinogen, UA: 0.2 mg/dL (ref 0.0–1.0)
pH: 7.5 (ref 5.0–8.0)

## 2010-04-23 LAB — URINE CULTURE
Colony Count: >=100000
Culture  Setup Time: 201108310336

## 2010-04-23 LAB — URINE MICROSCOPIC-ADD ON

## 2010-04-23 LAB — BASIC METABOLIC PANEL WITH GFR
BUN: 31 mg/dL — ABNORMAL HIGH (ref 6–23)
CO2: 32 meq/L (ref 19–32)
Calcium: 9.7 mg/dL (ref 8.4–10.5)
GFR calc non Af Amer: >60 mL/min (ref >60)
Glucose, Bld: 142 mg/dL — ABNORMAL HIGH (ref 70–99)

## 2010-04-23 LAB — BASIC METABOLIC PANEL
Chloride: 99 mEq/L (ref 96–112)
Creatinine, Ser: 0.67 mg/dL (ref 0.4–1.2)
GFR calc Af Amer: >60 mL/min (ref >60)
Potassium: 3.9 mEq/L (ref 3.5–5.1)
Sodium: 141 mEq/L (ref 135–145)

## 2010-04-23 LAB — DIFFERENTIAL
Basophils Absolute: 0 K/uL (ref 0.0–0.1)
Basophils Relative: 1 % (ref 0–1)
Eosinophils Absolute: 0 K/uL (ref 0.0–0.7)
Eosinophils Relative: 0 % (ref 0–5)
Lymphocytes Relative: 27 % (ref 12–46)
Lymphs Abs: 1.8 10*3/uL (ref 0.7–4.0)
Monocytes Absolute: 0.5 K/uL (ref 0.1–1.0)
Monocytes Relative: 8 % (ref 3–12)
Neutro Abs: 4.2 K/uL (ref 1.7–7.7)
Neutrophils Relative %: 64 % (ref 43–77)

## 2010-04-24 LAB — BASIC METABOLIC PANEL
BUN: 16 mg/dL (ref 6–23)
BUN: 6 mg/dL (ref 6–23)
CO2: 24 mEq/L (ref 19–32)
CO2: 25 mEq/L (ref 19–32)
Calcium: 8.6 mg/dL (ref 8.4–10.5)
Chloride: 104 mEq/L (ref 96–112)
Chloride: 112 mEq/L (ref 96–112)
Chloride: 114 mEq/L — ABNORMAL HIGH (ref 96–112)
Glucose, Bld: 129 mg/dL — ABNORMAL HIGH (ref 70–99)
Glucose, Bld: 87 mg/dL (ref 70–99)
Potassium: 3.1 mEq/L — ABNORMAL LOW (ref 3.5–5.1)
Potassium: 3.9 mEq/L (ref 3.5–5.1)
Potassium: 4.3 mEq/L (ref 3.5–5.1)
Sodium: 136 mEq/L (ref 135–145)
Sodium: 144 mEq/L (ref 135–145)
Sodium: 147 mEq/L — ABNORMAL HIGH (ref 135–145)

## 2010-04-24 LAB — GLUCOSE, CAPILLARY
Glucose-Capillary: 104 mg/dL — ABNORMAL HIGH (ref 70–99)
Glucose-Capillary: 106 mg/dL — ABNORMAL HIGH (ref 70–99)
Glucose-Capillary: 110 mg/dL — ABNORMAL HIGH (ref 70–99)
Glucose-Capillary: 131 mg/dL — ABNORMAL HIGH (ref 70–99)
Glucose-Capillary: 132 mg/dL — ABNORMAL HIGH (ref 70–99)
Glucose-Capillary: 133 mg/dL — ABNORMAL HIGH (ref 70–99)
Glucose-Capillary: 138 mg/dL — ABNORMAL HIGH (ref 70–99)
Glucose-Capillary: 155 mg/dL — ABNORMAL HIGH (ref 70–99)
Glucose-Capillary: 169 mg/dL — ABNORMAL HIGH (ref 70–99)
Glucose-Capillary: 169 mg/dL — ABNORMAL HIGH (ref 70–99)
Glucose-Capillary: 170 mg/dL — ABNORMAL HIGH (ref 70–99)
Glucose-Capillary: 170 mg/dL — ABNORMAL HIGH (ref 70–99)
Glucose-Capillary: 176 mg/dL — ABNORMAL HIGH (ref 70–99)
Glucose-Capillary: 202 mg/dL — ABNORMAL HIGH (ref 70–99)
Glucose-Capillary: 225 mg/dL — ABNORMAL HIGH (ref 70–99)
Glucose-Capillary: 253 mg/dL — ABNORMAL HIGH (ref 70–99)
Glucose-Capillary: 264 mg/dL — ABNORMAL HIGH (ref 70–99)
Glucose-Capillary: 265 mg/dL — ABNORMAL HIGH (ref 70–99)
Glucose-Capillary: 80 mg/dL (ref 70–99)
Glucose-Capillary: 81 mg/dL (ref 70–99)
Glucose-Capillary: 86 mg/dL (ref 70–99)
Glucose-Capillary: 89 mg/dL (ref 70–99)

## 2010-04-24 LAB — URINALYSIS, ROUTINE W REFLEX MICROSCOPIC
Protein, ur: >300 mg/dL — AB
Urobilinogen, UA: 0.2 mg/dL (ref 0.0–1.0)

## 2010-04-24 LAB — CBC
HCT: 28.3 % — ABNORMAL LOW (ref 36.0–46.0)
HCT: 38 % (ref 36.0–46.0)
Hemoglobin: 9.3 g/dL — ABNORMAL LOW (ref 12.0–15.0)
MCHC: 33 g/dL (ref 30.0–36.0)
MCV: 82.6 fL (ref 78.0–100.0)
MCV: 84.8 fL (ref 78.0–100.0)
RDW: 15 % (ref 11.5–15.5)
RDW: 15.5 % (ref 11.5–15.5)
WBC: 7.5 10*3/uL (ref 4.0–10.5)

## 2010-04-24 LAB — DIFFERENTIAL
Basophils Absolute: 0 10*3/uL (ref 0.0–0.1)
Basophils Relative: 0 % (ref 0–1)
Neutro Abs: 5.4 10*3/uL (ref 1.7–7.7)
Neutrophils Relative %: 72 % (ref 43–77)

## 2010-04-24 LAB — URINE MICROSCOPIC-ADD ON

## 2010-04-24 LAB — COMPREHENSIVE METABOLIC PANEL
Alkaline Phosphatase: 79 U/L (ref 39–117)
BUN: 23 mg/dL (ref 6–23)
CO2: 21 mEq/L (ref 19–32)
Chloride: 114 mEq/L — ABNORMAL HIGH (ref 96–112)
GFR calc non Af Amer: >60 mL/min (ref >60)
Glucose, Bld: 225 mg/dL — ABNORMAL HIGH (ref 70–99)
Potassium: 3.6 mEq/L (ref 3.5–5.1)
Total Bilirubin: 0.2 mg/dL — ABNORMAL LOW (ref 0.3–1.2)
Total Protein: 7.6 g/dL (ref 6.0–8.3)

## 2010-04-24 LAB — URINE CULTURE: Colony Count: NO GROWTH

## 2010-04-24 LAB — PROCALCITONIN: Procalcitonin: 2 ng/mL

## 2010-04-24 LAB — CULTURE, BLOOD (ROUTINE X 2): Culture: NO GROWTH

## 2010-04-24 LAB — MAGNESIUM: Magnesium: 1.4 mg/dL — ABNORMAL LOW (ref 1.5–2.5)

## 2010-04-24 LAB — LACTIC ACID, PLASMA: Lactic Acid, Venous: 1.2 mmol/L (ref 0.5–2.2)

## 2010-04-25 LAB — COMPREHENSIVE METABOLIC PANEL WITH GFR
ALT: 10 U/L (ref 0–35)
AST: 17 U/L (ref 0–37)
Albumin: 2.9 g/dL — ABNORMAL LOW (ref 3.5–5.2)
Alkaline Phosphatase: 76 U/L (ref 39–117)
Calcium: 8.6 mg/dL (ref 8.4–10.5)
GFR calc Af Amer: >60 mL/min (ref >60)
Glucose, Bld: 207 mg/dL — ABNORMAL HIGH (ref 70–99)
Potassium: 3.7 meq/L (ref 3.5–5.1)
Sodium: 137 meq/L (ref 135–145)
Total Protein: 7.1 g/dL (ref 6.0–8.3)

## 2010-04-25 LAB — CBC
HCT: 29.5 % — ABNORMAL LOW (ref 36.0–46.0)
HCT: 38 % (ref 36.0–46.0)
Hemoglobin: 12.8 g/dL (ref 12.0–15.0)
Hemoglobin: 9.8 g/dL — ABNORMAL LOW (ref 12.0–15.0)
MCH: 28.6 pg (ref 26.0–34.0)
MCH: 28.6 pg (ref 26.0–34.0)
MCHC: 33.1 g/dL (ref 30.0–36.0)
MCHC: 33.6 g/dL (ref 30.0–36.0)
MCHC: 33.6 g/dL (ref 30.0–36.0)
MCV: 85.1 fL (ref 78.0–100.0)
MCV: 85.2 fL (ref 78.0–100.0)
MCV: 85.5 fL (ref 78.0–100.0)
Platelets: 168 10*3/uL (ref 150–400)
Platelets: 195 10*3/uL (ref 150–400)
Platelets: 301 10*3/uL (ref 150–400)
RBC: 4.46 MIL/uL (ref 3.87–5.11)
RDW: 15.7 % — ABNORMAL HIGH (ref 11.5–15.5)
RDW: 15.8 % — ABNORMAL HIGH (ref 11.5–15.5)
RDW: 16 % — ABNORMAL HIGH (ref 11.5–15.5)
RDW: 16.1 % — ABNORMAL HIGH (ref 11.5–15.5)
WBC: 5.9 10*3/uL (ref 4.0–10.5)
WBC: 6.7 10*3/uL (ref 4.0–10.5)
WBC: 8 10*3/uL (ref 4.0–10.5)
WBC: 8.5 10*3/uL (ref 4.0–10.5)

## 2010-04-25 LAB — URINALYSIS, ROUTINE W REFLEX MICROSCOPIC
Bilirubin Urine: NEGATIVE
Glucose, UA: NEGATIVE mg/dL
Ketones, ur: NEGATIVE mg/dL
Nitrite: NEGATIVE
Protein, ur: NEGATIVE mg/dL
Specific Gravity, Urine: 1.013 (ref 1.005–1.030)
Urobilinogen, UA: 0.2 mg/dL (ref 0.0–1.0)
pH: 6 (ref 5.0–8.0)

## 2010-04-25 LAB — BASIC METABOLIC PANEL
BUN: 10 mg/dL (ref 6–23)
BUN: 10 mg/dL (ref 6–23)
BUN: 5 mg/dL — ABNORMAL LOW (ref 6–23)
BUN: 8 mg/dL (ref 6–23)
CO2: 25 mEq/L (ref 19–32)
Calcium: 8.3 mg/dL — ABNORMAL LOW (ref 8.4–10.5)
Calcium: 8.4 mg/dL (ref 8.4–10.5)
Chloride: 104 mEq/L (ref 96–112)
Chloride: 107 mEq/L (ref 96–112)
Creatinine, Ser: 0.53 mg/dL (ref 0.4–1.2)
Creatinine, Ser: 0.58 mg/dL (ref 0.4–1.2)
Creatinine, Ser: 0.69 mg/dL (ref 0.4–1.2)
GFR calc non Af Amer: >60 mL/min (ref >60)
GFR calc non Af Amer: >60 mL/min (ref >60)
GFR calc non Af Amer: >60 mL/min (ref >60)
Glucose, Bld: 216 mg/dL — ABNORMAL HIGH (ref 70–99)
Glucose, Bld: 233 mg/dL — ABNORMAL HIGH (ref 70–99)
Glucose, Bld: 273 mg/dL — ABNORMAL HIGH (ref 70–99)
Potassium: 3.4 mEq/L — ABNORMAL LOW (ref 3.5–5.1)
Potassium: 4.1 mEq/L (ref 3.5–5.1)
Sodium: 140 mEq/L (ref 135–145)

## 2010-04-25 LAB — GLUCOSE, CAPILLARY
Glucose-Capillary: 146 mg/dL — ABNORMAL HIGH (ref 70–99)
Glucose-Capillary: 162 mg/dL — ABNORMAL HIGH (ref 70–99)
Glucose-Capillary: 172 mg/dL — ABNORMAL HIGH (ref 70–99)
Glucose-Capillary: 173 mg/dL — ABNORMAL HIGH (ref 70–99)
Glucose-Capillary: 185 mg/dL — ABNORMAL HIGH (ref 70–99)
Glucose-Capillary: 186 mg/dL — ABNORMAL HIGH (ref 70–99)
Glucose-Capillary: 195 mg/dL — ABNORMAL HIGH (ref 70–99)
Glucose-Capillary: 206 mg/dL — ABNORMAL HIGH (ref 70–99)
Glucose-Capillary: 214 mg/dL — ABNORMAL HIGH (ref 70–99)
Glucose-Capillary: 242 mg/dL — ABNORMAL HIGH (ref 70–99)
Glucose-Capillary: 244 mg/dL — ABNORMAL HIGH (ref 70–99)
Glucose-Capillary: 257 mg/dL — ABNORMAL HIGH (ref 70–99)
Glucose-Capillary: 262 mg/dL — ABNORMAL HIGH (ref 70–99)
Glucose-Capillary: 308 mg/dL — ABNORMAL HIGH (ref 70–99)
Glucose-Capillary: 311 mg/dL — ABNORMAL HIGH (ref 70–99)

## 2010-04-25 LAB — DIFFERENTIAL
Basophils Absolute: 0 10*3/uL (ref 0.0–0.1)
Basophils Absolute: 0.1 10*3/uL (ref 0.0–0.1)
Basophils Relative: 0 % (ref 0–1)
Basophils Relative: 1 % (ref 0–1)
Eosinophils Absolute: 0 10*3/uL (ref 0.0–0.7)
Eosinophils Relative: 0 % (ref 0–5)
Lymphocytes Relative: 32 % (ref 12–46)
Lymphocytes Relative: 33 % (ref 12–46)
Lymphs Abs: 2.6 K/uL (ref 0.7–4.0)
Monocytes Absolute: 0.5 K/uL (ref 0.1–1.0)
Monocytes Relative: 6 % (ref 3–12)
Neutro Abs: 4.1 10*3/uL (ref 1.7–7.7)
Neutro Abs: 4.8 10*3/uL (ref 1.7–7.7)
Neutrophils Relative %: 60 % (ref 43–77)

## 2010-04-25 LAB — CULTURE, BLOOD (ROUTINE X 2)
Culture: NO GROWTH
Culture: NO GROWTH

## 2010-04-25 LAB — BASIC METABOLIC PANEL WITH GFR
CO2: 25 meq/L (ref 19–32)
Calcium: 8.5 mg/dL (ref 8.4–10.5)
GFR calc Af Amer: >60 mL/min (ref >60)
Sodium: 138 meq/L (ref 135–145)

## 2010-04-25 LAB — URINE CULTURE
Colony Count: NO GROWTH
Culture: NO GROWTH

## 2010-04-25 LAB — URINE MICROSCOPIC-ADD ON

## 2010-04-25 LAB — COMPREHENSIVE METABOLIC PANEL
BUN: 10 mg/dL (ref 6–23)
CO2: 23 mEq/L (ref 19–32)
Chloride: 106 mEq/L (ref 96–112)
Creatinine, Ser: 0.71 mg/dL (ref 0.4–1.2)
GFR calc non Af Amer: >60 mL/min (ref >60)
Total Bilirubin: 0.4 mg/dL (ref 0.3–1.2)

## 2010-04-25 LAB — CLOSTRIDIUM DIFFICILE EIA: C difficile Toxins A+B, EIA: NEGATIVE

## 2010-04-26 LAB — URINE CULTURE
Culture: NO GROWTH
Culture: NO GROWTH
Culture: NO GROWTH
Special Requests: NEGATIVE

## 2010-04-26 LAB — BASIC METABOLIC PANEL
BUN: 6 mg/dL (ref 6–23)
CO2: 26 mEq/L (ref 19–32)
CO2: 28 mEq/L (ref 19–32)
CO2: 28 mEq/L (ref 19–32)
CO2: 29 mEq/L (ref 19–32)
Calcium: 8.4 mg/dL (ref 8.4–10.5)
Calcium: 8.7 mg/dL (ref 8.4–10.5)
Calcium: 8.8 mg/dL (ref 8.4–10.5)
Chloride: 105 mEq/L (ref 96–112)
Chloride: 107 mEq/L (ref 96–112)
Chloride: 107 mEq/L (ref 96–112)
Creatinine, Ser: 0.59 mg/dL (ref 0.4–1.2)
Creatinine, Ser: 0.61 mg/dL (ref 0.4–1.2)
GFR calc Af Amer: >60 mL/min (ref >60)
GFR calc Af Amer: >60 mL/min (ref >60)
GFR calc Af Amer: >60 mL/min (ref >60)
GFR calc Af Amer: >60 mL/min (ref >60)
GFR calc non Af Amer: >60 mL/min (ref >60)
Glucose, Bld: 148 mg/dL — ABNORMAL HIGH (ref 70–99)
Glucose, Bld: 232 mg/dL — ABNORMAL HIGH (ref 70–99)
Potassium: 3.5 mEq/L (ref 3.5–5.1)
Potassium: 3.5 mEq/L (ref 3.5–5.1)
Sodium: 139 mEq/L (ref 135–145)
Sodium: 140 mEq/L (ref 135–145)
Sodium: 141 mEq/L (ref 135–145)
Sodium: 141 mEq/L (ref 135–145)
Sodium: 143 mEq/L (ref 135–145)

## 2010-04-26 LAB — COMPREHENSIVE METABOLIC PANEL
ALT: 10 U/L (ref 0–35)
AST: 19 U/L (ref 0–37)
AST: 24 U/L (ref 0–37)
Albumin: 2.7 g/dL — ABNORMAL LOW (ref 3.5–5.2)
Albumin: 3 g/dL — ABNORMAL LOW (ref 3.5–5.2)
Alkaline Phosphatase: 100 U/L (ref 39–117)
Alkaline Phosphatase: 87 U/L (ref 39–117)
Alkaline Phosphatase: 88 U/L (ref 39–117)
BUN: 15 mg/dL (ref 6–23)
BUN: 8 mg/dL (ref 6–23)
CO2: 24 mEq/L (ref 19–32)
CO2: 28 mEq/L (ref 19–32)
Calcium: 8.9 mg/dL (ref 8.4–10.5)
Chloride: 102 mEq/L (ref 96–112)
Chloride: 111 mEq/L (ref 96–112)
Creatinine, Ser: 0.74 mg/dL (ref 0.4–1.2)
Creatinine, Ser: 0.8 mg/dL (ref 0.4–1.2)
GFR calc Af Amer: >60 mL/min (ref >60)
GFR calc non Af Amer: >60 mL/min (ref >60)
GFR calc non Af Amer: >60 mL/min (ref >60)
Glucose, Bld: 240 mg/dL — ABNORMAL HIGH (ref 70–99)
Potassium: 3.3 mEq/L — ABNORMAL LOW (ref 3.5–5.1)
Potassium: 4 mEq/L (ref 3.5–5.1)
Sodium: 142 mEq/L (ref 135–145)
Total Bilirubin: 0.5 mg/dL (ref 0.3–1.2)
Total Bilirubin: 0.8 mg/dL (ref 0.3–1.2)
Total Protein: 7.2 g/dL (ref 6.0–8.3)

## 2010-04-26 LAB — DIFFERENTIAL
Basophils Absolute: 0 10*3/uL (ref 0.0–0.1)
Basophils Absolute: 0.1 10*3/uL (ref 0.0–0.1)
Basophils Relative: 0 % (ref 0–1)
Basophils Relative: 1 % (ref 0–1)
Eosinophils Absolute: 0.1 10*3/uL (ref 0.0–0.7)
Eosinophils Relative: 0 % (ref 0–5)
Eosinophils Relative: 2 % (ref 0–5)
Lymphocytes Relative: 19 % (ref 12–46)
Lymphocytes Relative: 26 % (ref 12–46)
Lymphs Abs: 1.5 10*3/uL (ref 0.7–4.0)
Monocytes Absolute: 0.2 10*3/uL (ref 0.1–1.0)
Monocytes Relative: 6 % (ref 3–12)
Neutro Abs: 7.5 10*3/uL (ref 1.7–7.7)
Neutrophils Relative %: 59 % (ref 43–77)
Neutrophils Relative %: 75 % (ref 43–77)

## 2010-04-26 LAB — CULTURE, BLOOD (ROUTINE X 2)
Culture: NO GROWTH
Culture: NO GROWTH
Culture: NO GROWTH

## 2010-04-26 LAB — GLUCOSE, CAPILLARY
Glucose-Capillary: 148 mg/dL — ABNORMAL HIGH (ref 70–99)
Glucose-Capillary: 158 mg/dL — ABNORMAL HIGH (ref 70–99)
Glucose-Capillary: 159 mg/dL — ABNORMAL HIGH (ref 70–99)
Glucose-Capillary: 161 mg/dL — ABNORMAL HIGH (ref 70–99)
Glucose-Capillary: 161 mg/dL — ABNORMAL HIGH (ref 70–99)
Glucose-Capillary: 163 mg/dL — ABNORMAL HIGH (ref 70–99)
Glucose-Capillary: 167 mg/dL — ABNORMAL HIGH (ref 70–99)
Glucose-Capillary: 170 mg/dL — ABNORMAL HIGH (ref 70–99)
Glucose-Capillary: 172 mg/dL — ABNORMAL HIGH (ref 70–99)
Glucose-Capillary: 177 mg/dL — ABNORMAL HIGH (ref 70–99)
Glucose-Capillary: 180 mg/dL — ABNORMAL HIGH (ref 70–99)
Glucose-Capillary: 181 mg/dL — ABNORMAL HIGH (ref 70–99)
Glucose-Capillary: 184 mg/dL — ABNORMAL HIGH (ref 70–99)
Glucose-Capillary: 186 mg/dL — ABNORMAL HIGH (ref 70–99)
Glucose-Capillary: 189 mg/dL — ABNORMAL HIGH (ref 70–99)
Glucose-Capillary: 202 mg/dL — ABNORMAL HIGH (ref 70–99)
Glucose-Capillary: 207 mg/dL — ABNORMAL HIGH (ref 70–99)
Glucose-Capillary: 207 mg/dL — ABNORMAL HIGH (ref 70–99)
Glucose-Capillary: 215 mg/dL — ABNORMAL HIGH (ref 70–99)
Glucose-Capillary: 220 mg/dL — ABNORMAL HIGH (ref 70–99)
Glucose-Capillary: 230 mg/dL — ABNORMAL HIGH (ref 70–99)
Glucose-Capillary: 230 mg/dL — ABNORMAL HIGH (ref 70–99)
Glucose-Capillary: 237 mg/dL — ABNORMAL HIGH (ref 70–99)
Glucose-Capillary: 245 mg/dL — ABNORMAL HIGH (ref 70–99)
Glucose-Capillary: 267 mg/dL — ABNORMAL HIGH (ref 70–99)

## 2010-04-26 LAB — CBC
Hemoglobin: 10 g/dL — ABNORMAL LOW (ref 12.0–15.0)
Hemoglobin: 10.4 g/dL — ABNORMAL LOW (ref 12.0–15.0)
Hemoglobin: 10.9 g/dL — ABNORMAL LOW (ref 12.0–15.0)
Hemoglobin: 12.4 g/dL (ref 12.0–15.0)
Hemoglobin: 9 g/dL — ABNORMAL LOW (ref 12.0–15.0)
Hemoglobin: 9.5 g/dL — ABNORMAL LOW (ref 12.0–15.0)
Hemoglobin: 9.8 g/dL — ABNORMAL LOW (ref 12.0–15.0)
MCH: 28.1 pg (ref 26.0–34.0)
MCHC: 32.3 g/dL (ref 30.0–36.0)
MCHC: 33.2 g/dL (ref 30.0–36.0)
MCHC: 33.4 g/dL (ref 30.0–36.0)
MCHC: 33.5 g/dL (ref 30.0–36.0)
MCHC: 33.9 g/dL (ref 30.0–36.0)
MCHC: 33.9 g/dL (ref 30.0–36.0)
MCV: 84.5 fL (ref 78.0–100.0)
MCV: 84.5 fL (ref 78.0–100.0)
MCV: 86.1 fL (ref 78.0–100.0)
Platelets: 194 10*3/uL (ref 150–400)
RBC: 3.2 MIL/uL — ABNORMAL LOW (ref 3.87–5.11)
RBC: 3.25 MIL/uL — ABNORMAL LOW (ref 3.87–5.11)
RBC: 3.43 MIL/uL — ABNORMAL LOW (ref 3.87–5.11)
RBC: 3.57 MIL/uL — ABNORMAL LOW (ref 3.87–5.11)
RBC: 3.76 MIL/uL — ABNORMAL LOW (ref 3.87–5.11)
RDW: 14.6 % (ref 11.5–15.5)
RDW: 15 % (ref 11.5–15.5)
RDW: 15.1 % (ref 11.5–15.5)
RDW: 15.2 % (ref 11.5–15.5)
WBC: 4.5 10*3/uL (ref 4.0–10.5)
WBC: 5.3 10*3/uL (ref 4.0–10.5)
WBC: 9.9 10*3/uL (ref 4.0–10.5)

## 2010-04-26 LAB — URINALYSIS, ROUTINE W REFLEX MICROSCOPIC
Bilirubin Urine: NEGATIVE
Glucose, UA: 100 mg/dL — AB
Glucose, UA: NEGATIVE mg/dL
Hgb urine dipstick: NEGATIVE
Hgb urine dipstick: NEGATIVE
Ketones, ur: NEGATIVE mg/dL
Nitrite: NEGATIVE
Protein, ur: 100 mg/dL — AB
Protein, ur: NEGATIVE mg/dL
Specific Gravity, Urine: 1.021 (ref 1.005–1.030)
Specific Gravity, Urine: 1.029 (ref 1.005–1.030)
Urobilinogen, UA: 1 mg/dL (ref 0.0–1.0)
Urobilinogen, UA: 1 mg/dL (ref 0.0–1.0)
pH: 6.5 (ref 5.0–8.0)

## 2010-04-26 LAB — AMYLASE: Amylase: 61 U/L (ref 0–105)

## 2010-04-26 LAB — URINE MICROSCOPIC-ADD ON

## 2010-04-26 LAB — LIPASE, BLOOD: Lipase: 79 U/L — ABNORMAL HIGH (ref 11–59)

## 2010-04-26 LAB — CK TOTAL AND CKMB (NOT AT ARMC)
CK, MB: 2.5 ng/mL (ref 0.3–4.0)
Relative Index: INVALID (ref 0.0–2.5)

## 2010-04-26 LAB — PHOSPHORUS: Phosphorus: 3.2 mg/dL (ref 2.3–4.6)

## 2010-04-26 LAB — TROPONIN I: Troponin I: 0.02 ng/mL (ref 0.00–0.06)

## 2010-04-26 LAB — PROCALCITONIN: Procalcitonin: 1.85 ng/mL

## 2010-04-26 LAB — HEMOGLOBIN A1C: Hgb A1c MFr Bld: 7.2 % — ABNORMAL HIGH (ref <5.7)

## 2010-04-26 LAB — LACTIC ACID, PLASMA: Lactic Acid, Venous: 2.1 mmol/L (ref 0.5–2.2)

## 2010-04-27 LAB — HEMOGLOBIN A1C: Mean Plasma Glucose: 189 mg/dL — ABNORMAL HIGH (ref <117)

## 2010-04-27 LAB — GLUCOSE, CAPILLARY
Glucose-Capillary: 124 mg/dL — ABNORMAL HIGH (ref 70–99)
Glucose-Capillary: 142 mg/dL — ABNORMAL HIGH (ref 70–99)
Glucose-Capillary: 154 mg/dL — ABNORMAL HIGH (ref 70–99)
Glucose-Capillary: 164 mg/dL — ABNORMAL HIGH (ref 70–99)
Glucose-Capillary: 166 mg/dL — ABNORMAL HIGH (ref 70–99)
Glucose-Capillary: 176 mg/dL — ABNORMAL HIGH (ref 70–99)
Glucose-Capillary: 213 mg/dL — ABNORMAL HIGH (ref 70–99)
Glucose-Capillary: 215 mg/dL — ABNORMAL HIGH (ref 70–99)
Glucose-Capillary: 225 mg/dL — ABNORMAL HIGH (ref 70–99)
Glucose-Capillary: 232 mg/dL — ABNORMAL HIGH (ref 70–99)
Glucose-Capillary: 233 mg/dL — ABNORMAL HIGH (ref 70–99)
Glucose-Capillary: 234 mg/dL — ABNORMAL HIGH (ref 70–99)
Glucose-Capillary: 239 mg/dL — ABNORMAL HIGH (ref 70–99)
Glucose-Capillary: 244 mg/dL — ABNORMAL HIGH (ref 70–99)
Glucose-Capillary: 245 mg/dL — ABNORMAL HIGH (ref 70–99)
Glucose-Capillary: 268 mg/dL — ABNORMAL HIGH (ref 70–99)
Glucose-Capillary: 272 mg/dL — ABNORMAL HIGH (ref 70–99)
Glucose-Capillary: 275 mg/dL — ABNORMAL HIGH (ref 70–99)
Glucose-Capillary: 349 mg/dL — ABNORMAL HIGH (ref 70–99)

## 2010-04-27 LAB — CBC
HCT: 28.4 % — ABNORMAL LOW (ref 36.0–46.0)
HCT: 29.9 % — ABNORMAL LOW (ref 36.0–46.0)
HCT: 31.2 % — ABNORMAL LOW (ref 36.0–46.0)
HCT: 41.2 % (ref 36.0–46.0)
Hemoglobin: 10.6 g/dL — ABNORMAL LOW (ref 12.0–15.0)
Hemoglobin: 11.1 g/dL — ABNORMAL LOW (ref 12.0–15.0)
Hemoglobin: 14.1 g/dL (ref 12.0–15.0)
Hemoglobin: 9.5 g/dL — ABNORMAL LOW (ref 12.0–15.0)
Hemoglobin: 9.8 g/dL — ABNORMAL LOW (ref 12.0–15.0)
MCHC: 33.4 g/dL (ref 30.0–36.0)
MCHC: 33.7 g/dL (ref 30.0–36.0)
MCHC: 33.8 g/dL (ref 30.0–36.0)
MCHC: 34 g/dL (ref 30.0–36.0)
MCHC: 34.3 g/dL (ref 30.0–36.0)
MCV: 86.2 fL (ref 78.0–100.0)
MCV: 87.3 fL (ref 78.0–100.0)
MCV: 87.4 fL (ref 78.0–100.0)
Platelets: 138 10*3/uL — ABNORMAL LOW (ref 150–400)
Platelets: 198 10*3/uL (ref 150–400)
Platelets: 230 10*3/uL (ref 150–400)
RBC: 3.27 MIL/uL — ABNORMAL LOW (ref 3.87–5.11)
RBC: 3.32 MIL/uL — ABNORMAL LOW (ref 3.87–5.11)
RBC: 3.36 MIL/uL — ABNORMAL LOW (ref 3.87–5.11)
RBC: 3.7 MIL/uL — ABNORMAL LOW (ref 3.87–5.11)
RDW: 14 % (ref 11.5–15.5)
RDW: 14.2 % (ref 11.5–15.5)
RDW: 14.3 % (ref 11.5–15.5)
RDW: 14.5 % (ref 11.5–15.5)
RDW: 14.5 % (ref 11.5–15.5)
WBC: 5.1 10*3/uL (ref 4.0–10.5)
WBC: 5.3 10*3/uL (ref 4.0–10.5)
WBC: 6.4 10*3/uL (ref 4.0–10.5)
WBC: 9.3 10*3/uL (ref 4.0–10.5)

## 2010-04-27 LAB — BASIC METABOLIC PANEL
BUN: 13 mg/dL (ref 6–23)
CO2: 22 mEq/L (ref 19–32)
CO2: 23 mEq/L (ref 19–32)
Calcium: 8.1 mg/dL — ABNORMAL LOW (ref 8.4–10.5)
Calcium: 8.1 mg/dL — ABNORMAL LOW (ref 8.4–10.5)
Chloride: 105 mEq/L (ref 96–112)
Creatinine, Ser: 0.6 mg/dL (ref 0.4–1.2)
GFR calc Af Amer: >60 mL/min (ref >60)
GFR calc Af Amer: >60 mL/min (ref >60)
GFR calc Af Amer: >60 mL/min (ref >60)
GFR calc non Af Amer: >60 mL/min (ref >60)
GFR calc non Af Amer: >60 mL/min (ref >60)
Glucose, Bld: 170 mg/dL — ABNORMAL HIGH (ref 70–99)
Potassium: 3.1 mEq/L — ABNORMAL LOW (ref 3.5–5.1)
Sodium: 137 mEq/L (ref 135–145)
Sodium: 138 mEq/L (ref 135–145)

## 2010-04-27 LAB — POCT CARDIAC MARKERS
CKMB, poc: <1 ng/mL — ABNORMAL LOW (ref 1.0–8.0)
CKMB, poc: <1 ng/mL — ABNORMAL LOW (ref 1.0–8.0)
Myoglobin, poc: 198 ng/mL (ref 12–200)

## 2010-04-27 LAB — COMPREHENSIVE METABOLIC PANEL
ALT: 10 U/L (ref 0–35)
ALT: 10 U/L (ref 0–35)
ALT: 8 U/L (ref 0–35)
AST: 10 U/L (ref 0–37)
AST: 12 U/L (ref 0–37)
Albumin: 2.2 g/dL — ABNORMAL LOW (ref 3.5–5.2)
Albumin: 2.3 g/dL — ABNORMAL LOW (ref 3.5–5.2)
Alkaline Phosphatase: 101 U/L (ref 39–117)
Alkaline Phosphatase: 82 U/L (ref 39–117)
Alkaline Phosphatase: 84 U/L (ref 39–117)
BUN: 15 mg/dL (ref 6–23)
BUN: 5 mg/dL — ABNORMAL LOW (ref 6–23)
BUN: 6 mg/dL (ref 6–23)
BUN: 7 mg/dL (ref 6–23)
CO2: 22 mEq/L (ref 19–32)
Calcium: 8.8 mg/dL (ref 8.4–10.5)
Calcium: 9.3 mg/dL (ref 8.4–10.5)
Calcium: 9.4 mg/dL (ref 8.4–10.5)
Chloride: 103 mEq/L (ref 96–112)
Chloride: 106 mEq/L (ref 96–112)
Creatinine, Ser: 0.59 mg/dL (ref 0.4–1.2)
Creatinine, Ser: 0.59 mg/dL (ref 0.4–1.2)
GFR calc Af Amer: >60 mL/min (ref >60)
Glucose, Bld: 237 mg/dL — ABNORMAL HIGH (ref 70–99)
Glucose, Bld: 241 mg/dL — ABNORMAL HIGH (ref 70–99)
Glucose, Bld: 258 mg/dL — ABNORMAL HIGH (ref 70–99)
Potassium: 3.5 mEq/L (ref 3.5–5.1)
Sodium: 135 mEq/L (ref 135–145)
Sodium: 139 mEq/L (ref 135–145)
Sodium: 139 mEq/L (ref 135–145)
Total Bilirubin: 0.5 mg/dL (ref 0.3–1.2)
Total Bilirubin: 0.6 mg/dL (ref 0.3–1.2)
Total Protein: 6.4 g/dL (ref 6.0–8.3)
Total Protein: 6.5 g/dL (ref 6.0–8.3)
Total Protein: 6.9 g/dL (ref 6.0–8.3)
Total Protein: 8.1 g/dL (ref 6.0–8.3)

## 2010-04-27 LAB — URINE CULTURE: Colony Count: NO GROWTH

## 2010-04-27 LAB — DIFFERENTIAL
Basophils Absolute: 0 10*3/uL (ref 0.0–0.1)
Basophils Absolute: 0 10*3/uL (ref 0.0–0.1)
Basophils Relative: 0 % (ref 0–1)
Basophils Relative: 0 % (ref 0–1)
Basophils Relative: 1 % (ref 0–1)
Eosinophils Absolute: 0.1 10*3/uL (ref 0.0–0.7)
Eosinophils Absolute: 0.1 10*3/uL (ref 0.0–0.7)
Eosinophils Relative: 1 % (ref 0–5)
Eosinophils Relative: 2 % (ref 0–5)
Lymphocytes Relative: 16 % (ref 12–46)
Lymphocytes Relative: 19 % (ref 12–46)
Lymphocytes Relative: 20 % (ref 12–46)
Lymphocytes Relative: 29 % (ref 12–46)
Lymphs Abs: 1 10*3/uL (ref 0.7–4.0)
Lymphs Abs: 1 10*3/uL (ref 0.7–4.0)
Lymphs Abs: 1.2 10*3/uL (ref 0.7–4.0)
Lymphs Abs: 1.9 10*3/uL (ref 0.7–4.0)
Lymphs Abs: 2 10*3/uL (ref 0.7–4.0)
Monocytes Absolute: 0.4 10*3/uL (ref 0.1–1.0)
Monocytes Absolute: 0.4 10*3/uL (ref 0.1–1.0)
Monocytes Absolute: 0.5 10*3/uL (ref 0.1–1.0)
Monocytes Relative: 6 % (ref 3–12)
Monocytes Relative: 6 % (ref 3–12)
Monocytes Relative: 7 % (ref 3–12)
Monocytes Relative: 8 % (ref 3–12)
Monocytes Relative: 9 % (ref 3–12)
Neutro Abs: 3.4 10*3/uL (ref 1.7–7.7)
Neutro Abs: 3.9 10*3/uL (ref 1.7–7.7)
Neutro Abs: 4.3 10*3/uL (ref 1.7–7.7)
Neutro Abs: 4.9 10*3/uL (ref 1.7–7.7)
Neutro Abs: 9.3 10*3/uL — ABNORMAL HIGH (ref 1.7–7.7)
Neutrophils Relative %: 59 % (ref 43–77)
Neutrophils Relative %: 71 % (ref 43–77)
Neutrophils Relative %: 77 % (ref 43–77)

## 2010-04-27 LAB — MAGNESIUM: Magnesium: 1.7 mg/dL (ref 1.5–2.5)

## 2010-04-27 LAB — URINALYSIS, ROUTINE W REFLEX MICROSCOPIC
Leukocytes, UA: NEGATIVE
Specific Gravity, Urine: 1.025 (ref 1.005–1.030)
Urobilinogen, UA: 1 mg/dL (ref 0.0–1.0)

## 2010-04-27 LAB — CULTURE, BLOOD (ROUTINE X 2): Culture: NO GROWTH

## 2010-04-27 LAB — CLOSTRIDIUM DIFFICILE EIA
C difficile Toxins A+B, EIA: NEGATIVE
C difficile Toxins A+B, EIA: NEGATIVE

## 2010-04-27 LAB — HEMOCCULT GUIAC POC 1CARD (OFFICE)
Fecal Occult Bld: NEGATIVE
Fecal Occult Bld: NEGATIVE

## 2010-04-27 LAB — MRSA PCR SCREENING: MRSA by PCR: POSITIVE — AB

## 2010-04-27 LAB — URINE MICROSCOPIC-ADD ON

## 2010-04-27 LAB — LACTIC ACID, PLASMA: Lactic Acid, Venous: 4.5 mmol/L — ABNORMAL HIGH (ref 0.5–2.2)

## 2010-05-11 LAB — COMPREHENSIVE METABOLIC PANEL
AST: 18 U/L (ref 0–37)
Alkaline Phosphatase: 90 U/L (ref 39–117)
CO2: 28 mEq/L (ref 19–32)
Chloride: 105 mEq/L (ref 96–112)
Creatinine, Ser: 0.71 mg/dL (ref 0.4–1.2)
GFR calc Af Amer: >60 mL/min (ref >60)
GFR calc non Af Amer: >60 mL/min (ref >60)
Potassium: 3.9 mEq/L (ref 3.5–5.1)
Total Bilirubin: 0.4 mg/dL (ref 0.3–1.2)

## 2010-05-11 LAB — DIFFERENTIAL
Basophils Absolute: 0 10*3/uL (ref 0.0–0.1)
Basophils Relative: 0 % (ref 0–1)
Eosinophils Absolute: 0.3 10*3/uL (ref 0.0–0.7)
Eosinophils Relative: 4 % (ref 0–5)
Lymphocytes Relative: 34 % (ref 12–46)

## 2010-05-11 LAB — CBC
HCT: 35.7 % — ABNORMAL LOW (ref 36.0–46.0)
MCV: 86.3 fL (ref 78.0–100.0)
RBC: 4.13 MIL/uL (ref 3.87–5.11)
WBC: 6.9 10*3/uL (ref 4.0–10.5)

## 2010-05-11 LAB — URINALYSIS, ROUTINE W REFLEX MICROSCOPIC
Bilirubin Urine: NEGATIVE
Bilirubin Urine: NEGATIVE
Glucose, UA: NEGATIVE mg/dL
Ketones, ur: NEGATIVE mg/dL
Ketones, ur: NEGATIVE mg/dL
Leukocytes, UA: NEGATIVE
Nitrite: NEGATIVE
Protein, ur: NEGATIVE mg/dL
Specific Gravity, Urine: 1.026 (ref 1.005–1.030)
Urobilinogen, UA: 1 mg/dL (ref 0.0–1.0)
pH: 5.5 (ref 5.0–8.0)

## 2010-05-11 LAB — URINE CULTURE: Colony Count: >=100000

## 2010-05-11 LAB — GLUCOSE, CAPILLARY: Glucose-Capillary: 200 mg/dL — ABNORMAL HIGH (ref 70–99)

## 2010-05-11 LAB — URINE MICROSCOPIC-ADD ON

## 2010-05-24 LAB — URINALYSIS, ROUTINE W REFLEX MICROSCOPIC
Bilirubin Urine: NEGATIVE
Glucose, UA: NEGATIVE mg/dL
Ketones, ur: NEGATIVE mg/dL
Protein, ur: 30 mg/dL — AB
Urobilinogen, UA: 1 mg/dL (ref 0.0–1.0)

## 2010-05-24 LAB — DIFFERENTIAL
Basophils Absolute: 0.1 10*3/uL (ref 0.0–0.1)
Basophils Relative: 2 % — ABNORMAL HIGH (ref 0–1)
Lymphocytes Relative: 36 % (ref 12–46)
Monocytes Absolute: 0.5 10*3/uL (ref 0.1–1.0)
Neutro Abs: 4.3 10*3/uL (ref 1.7–7.7)
Neutrophils Relative %: 54 % (ref 43–77)

## 2010-05-24 LAB — CBC
HCT: 43 % (ref 36.0–46.0)
Hemoglobin: 14.1 g/dL (ref 12.0–15.0)
MCV: 86.1 fL (ref 78.0–100.0)
Platelets: 203 10*3/uL (ref 150–400)
RDW: 14.5 % (ref 11.5–15.5)

## 2010-05-24 LAB — COMPREHENSIVE METABOLIC PANEL
Albumin: 3.7 g/dL (ref 3.5–5.2)
Alkaline Phosphatase: 100 U/L (ref 39–117)
BUN: 16 mg/dL (ref 6–23)
Chloride: 107 mEq/L (ref 96–112)
Creatinine, Ser: 0.78 mg/dL (ref 0.4–1.2)
Glucose, Bld: 210 mg/dL — ABNORMAL HIGH (ref 70–99)
Potassium: 4.1 mEq/L (ref 3.5–5.1)
Total Bilirubin: 0.7 mg/dL (ref 0.3–1.2)
Total Protein: 7.6 g/dL (ref 6.0–8.3)

## 2010-05-24 LAB — URINE MICROSCOPIC-ADD ON

## 2010-05-24 LAB — GLUCOSE, CAPILLARY: Glucose-Capillary: 212 mg/dL — ABNORMAL HIGH (ref 70–99)

## 2010-05-24 LAB — PROTIME-INR: INR: 1.1 (ref 0.00–1.49)

## 2010-05-24 LAB — APTT: aPTT: 28 seconds (ref 24–37)

## 2010-06-22 NOTE — H&P (Signed)
[name], [name]            ACCOUNT NO.:  [id number]   MEDICAL RECORD NO.:  [id number]          PATIENT TYPE:  INP   LOCATION:  0103                         FACILITY:  [facility]   PHYSICIAN:  Hollice Espy, M.D.DATE OF BIRTH:  June 06, 1940   DATE OF ADMISSION:  11/27/2006  DATE OF DISCHARGE:                              HISTORY & PHYSICAL   PRIMARY CARE PHYSICIAN:  Holley Bouche, M.D.   CHIEF COMPLAINT:  Shortness of breath.   HISTORY OF PRESENT ILLNESS:  The patient is a 70 year old white female  with a past medical history of cerebral palsy, hypertension, and  diabetes mellitus who has been having problems with shortness of breath  and productive yellow sputum cough for the past few days.  She actually  came to the emergency room on November 26, 2006, at that time she had a  chest x-ray done which showed no evidence of any pneumonia, and she was  sent home with a presumed diagnosis of bronchitis and put on outpatient  antibiotics.  She returns today complaining of more significant  shortness of breath and productive cough.  Blood cultures were drawn.  She was found to have a white count of 11.1 with an 87% shift.  A repeat  chest x-ray was done which again showed no evidence of any acute  infiltrate and worrisome right basilar atelectasis.  The rest of her  labs were unremarkable, except for a blood sugar of 274.  The patient  initially was noted on arrival to have a temperature of 104.5, a heart  rate of 118, a blood pressure of 161/74, and sating 96% on 6 liters of  oxygen.  The patient currently appears to be slightly somnolent.  She is  not able to give me much of a history and other family is not present  when I am seeing her.  She tells me that she just feels rough.  When I  try to ask her a bit more than that she falls asleep.  So, therefore, I  am not able to get a review of systems.   PAST MEDICAL HISTORY:  1. Obesity.  2. Cerebral palsy.  3. Hypertension.  4.  Diabetes mellitus.  5. History of bladder spasm.   MEDICATIONS:  We do not have doses but she is on clonazepam, Detrol,  hydroxyzine, hydralazine, metformin, paroxetine, simvastatin, Lyrica,  and Vicodin.   ALLERGIES:  1. DEMEROL.  2. MORPHINE.  3. SULFA.   SOCIAL HISTORY:  No tobacco, alcohol, or drug use noted.   FAMILY HISTORY:  Noncontributory.   PHYSICAL EXAMINATION:  VITAL SIGNS:  On admission, temp 104.5, heart  rate 118 now down to 110, blood pressure 161/74, respirations 20, O2 sat  96% on 6 liters, 90% on 2 liters.  GENERAL:  She is drowsy.  Oriented x2 when I am able to wake her up  briefly.  HEENT:  Normocephalic atraumatic.  Her mucous membranes are slightly  dry.  She has a very narrow airway.  HEART:  Regular rhythm.  Mild tachycardia.  LUNGS:  She has bibasilar crackles and only moderate airway exchange at  best.  A few wheezes.  ABDOMEN:  Soft, obese, nontender.  Positive bowel sounds.  EXTREMITIES:  Showed no clubbing, cyanosis, trace pitting edema.   LABORATORY:  Sodium 139, potassium 4.6, chloride 105, bicarb 24, BUN 14,  creatinine 1.02, glucose 274.  Blood cultures drawn and pending.  White  count 11.1, H&H 13.5 and 40.1, MCV of 87, platelet count 185, 87% shift.  I have ordered an ABG which is currently pending.   ASSESSMENT/PLAN:  1. Bronchitis with worsening hypoxia.  I will put the patient on      intravenous antibiotics, oxygen treatment, and as needed      nebulizers.  We will check an ABG to rule out hypercarbia to      account for her somnolence.  We will admit the patient to the      hospital.  2. Diabetes mellitus.  We will continue her Metformin and try to get      her medication doses and put her on a moderate sliding scale.  3. Cerebral palsy, stable at this time.  4. Hypertension.  She shows no signs of hypotension.  Plan to put her      on hydralazine and again confirm her medications.      Hollice Espy, M.D.  Electronically  Signed     SKK/MEDQ  D:  11/27/2006  T:  11/27/2006  Job:  [id number]   cc:   Holley Bouche, M.D.  Fax: [phone]

## 2010-06-22 NOTE — H&P (Signed)
[name], [name]            ACCOUNT NO.:  [id number]   MEDICAL RECORD NO.:  [id number]          PATIENT TYPE:  INP   LOCATION:  1529                         FACILITY:  [facility]   PHYSICIAN:  Vinnie Level, MD    DATE OF BIRTH:  [birth year]   DATE OF ADMISSION:  12/03/2006  DATE OF DISCHARGE:                              HISTORY & PHYSICAL   [facility]   CHIEF COMPLAINT:  Shortness of breath.   HISTORY OF PRESENT ILLNESS:  This is a 70 year old white female with a  history of cerebral palsy, hypertension and diabetes mellitus who was  recently in this hospital from June 20 to July 31, 2006 with bronchitis  and was sent home with antibiotics.  She returns to the emergency  department tonight with shortness of breath and reported sat's in the  high 80's, though the lowest I see documented is 89%.  The patient is a  fairly poor historian, notes she has been unable to get any frank phlegm  up and doesn't feel she is much changed but does note some increased  shortness of breath since her discharge time.  She is unable to give me  any particulars in terms of her medication doses and is unable to  quantify whether she coughs or is at any increased risk of aspiration  when she is swallowing.  I note on the discharge summary that her sat's  were appropriate on room air prior to discharge, however, they do appear  to drift back down off of oxygen.  She just generally describes herself  as feeling rough and is unable to quantify there further.   PAST MEDICAL HISTORY:  1. Obesity.  2. Cerebral palsy.  3. Hypertension.  4. Diabetes mellitus.  5. History of bladder spasms.   MEDICATIONS:  Please note, I find no evidence of recorded dosages on any  of her medications, either in discharge summaries or  history and  physical reports.  These should be dictated on the discharge summary.  She appears to be on clonazepam, Detrol, hydroxyzine, hydralazine,  metformin,  paroxetine, simvastatin, Lyrica and Vicodin.  I started  routine starting doses for these medications, but these need to be  followed up on.   ALLERGIES:  1. DEMEROL.  2. MORPHINE.  3. SULFA.  All per chart.   SOCIAL HISTORY:  No tobacco, alcohol or drug use.  Has been living at  home with home health aides.   FAMILY HISTORY:  Noncontributory and reviewed with the patient.   REVIEW OF SYMPTOMS:  A full Review of Symptoms is unable to be obtained  due to the patients mental status.   PHYSICAL EXAMINATION:  VITAL SIGNS:  Temperature 98.7.  Blood pressure  150/85. Pulse 97.  Respiratory rate 20.  Pain 0/10, sating 89% on room  air, 94% on 2 to 3L nasal cannula.  GENERAL:  Somnolent white female who is alert at times but doesn't  always answer questions appropriately.  HEENT: Normocephalic, atraumatic.  External pinna is unremarkable.  Oropharynx is clear.  Mucous membranes are moist.  NECK:  Supple.  No appreciated  cervical adenopathy.  No evidence of  jugular venous distention.  No appreciated carotid bruits.  CARDIOVASCULAR:  Distant heart sounds but appears to be a regular,  mildly tachycardic rhythm.  LUNGS:  Poor air movement bilaterally.  Occasional wheeze and crackles  at the bases.  ABDOMEN:  Obese, nontender, nondistended.  Normal active bowel sounds.  EXTREMITIES:  No cyanosis, clubbing or frank edema.  SKIN:  Without rash.   LABORATORY DATA:  Her laboratory data is largely pending at this time.  Routine chest x-ray revealed stable volume loss in the right lower lobe  with medial basilar opacity likely related to atelectasis.   ASSESSMENT/PLAN:  PROBLEM #1:  SHORTNESS OF BREATH:  This is a 70-year-  old white female with history of cerebral palsy, diabetes mellitus,  bronchitis with recent hospitalization, discharged three days ago, now  with possible need for oxygen.  She does have increased shortness of  breath and increased work of breathing, however, appears  fairly limited  due to her overall mobility.  At the present time, a saturation of 89%  would likely not qualify her for home oxygen, however, some sort of  exertion may  help with this.  In the mix, however, remains repeat  aspiration, as this is a likely possibility, she does not appear to  fully clear her secretions while we were talking.   PROBLEM #2:  DIABETES:  Continue with all her home medications and  sliding scale insulin.   PROBLEM #3:  HYPERTENSION:  Continue with her routine home medications.   PROBLEM #4:  BLADDER SPASMS:  Will continue with her Detrol she was on  before.   PROBLEM #5:  DYSPHAGIA:  Will continue with her dysphagia diet with  nectar-thick, carbohydrate modification until we get a formal swallowing  evaluation, as this was somewhat recent.   PROBLEM #6:  MEDICATIONS DOSAGES:  Please follow up on her medication  doses and dictate such in the discharge summary.  The patient was unable  to answer full medication questions and is unable to clarify where she  was in the course of these.   PROBLEM #7:  FLUIDS, ELECTROLYTES AND NUTRITION:  As above, pending BMP.      Vinnie Level, MD  Electronically Signed     PMB/MEDQ  D:  12/04/2006  T:  12/04/2006  Job:  [id number]

## 2010-06-22 NOTE — Discharge Summary (Signed)
NAMEBLONDIE, [name]            ACCOUNT NO.:  [id number]   MEDICAL RECORD NO.:  [id number]          PATIENT TYPE:  INP   LOCATION:  1529                         FACILITY:  [facility]   PHYSICIAN:  Corinna L. Lendell Caprice, MDDATE OF BIRTH:  July 22, 1940   DATE OF ADMISSION:  12/04/2006  DATE OF DISCHARGE:  12/07/2006                               DISCHARGE SUMMARY   DISCHARGE DIAGNOSES:  1. Dyspnea.  2. Acute bronchitis.  3. Reported hypoxia.  4. Oropharyngeal dysphagia.  5. Diabetes.  6. Cerebral palsy.   DISCHARGE MEDICATIONS:  1. Clindamycin 300 mg p.o. q.i.d. until gone.  2. Her metformin has been increased to 1000 mg p.o. b.i.d.   She may continue her other outpatient medications which include:  1. Clonazepam 0.25 mg p.o. b.i.d.  2. Detrol 2 mg twice a day.  3. Hydralazine 10 mg every 6 hours.  4. Paroxetine 20 mg a day.  5. Simvastatin 20 mg a day.  6. Lyrica as previous.  7. Vicodin as needed.  8. Tylenol as needed.   CONDITION:  Stable.   CONSULTATIONS:  None.   DISPOSITION:  She is being sent home with home nurse, speech therapy and  home aide and physical therapy activity as previous, which apparently  include a lift chair.   FOLLOWUP:  Follow up with Dr. Tiburcio Pea in 1 week.  Consider repeat  modified barium swallow in 4 weeks.   DIET:  Should be dysphagia II with nectar-thick liquid, carbohydrate  modified.   PROCEDURES:  None.   LABORATORY DATA:  CBC significant for a hemoglobin of 11.9, hematocrit  35.3, otherwise unremarkable.  Initial complete metabolic panel  significant for a potassium of 3.3, glucose of 185, otherwise  unremarkable.  Blood cultures negative. ABG on room air revealed a pH of  7.422, pCO2 36, pO2 84, bicarbonate 23, base deficit 0.1, oxygen  saturation 96.9%.   SPECIAL STUDIES AND RADIOLOGY:  Chest x-ray showed nothing acute.   HISTORY AND HOSPITAL COURSE:  Please see H&P for complete admission  details.  The patient is a 70 year old  white female patient of Dr.  Tiburcio Pea, who had been discharged 4 days prior to admission, at which time  she was treated for acute bronchitis; she was also found to have or  oropharyngeal dysphagia.  I suspect she was noncompliant with her  dysphagia diet; this may have been why she became more short of breath.  Nevertheless, she presented with worsening shortness of breath and  apparently had oxygen saturations in the high 80s on room air, which  increased to 94% on 2 L.  She was a difficult historian, poor air  movement bilateral, occasional wheeze and rales at the bases.  The  patient was readmitted, started on oxygen, antibiotics and physical  therapy.  Her hypoxia resolved and at the time of discharge, she  continued to have a cough, but her shortness of breath had improved and  she was requesting to go home.  I reiterated the importance of being  compliant with her dysphagia diet.  She has completed almost to 10-day  course of Avelox and I have elected  to send her home on 4 days of  clindamycin and according to the last discharge summary, there was a  recommendation that the patient have a repeat modified barium swallow in  the future.      Corinna L. Lendell Caprice, MD  Electronically Signed     CLS/MEDQ  D:  12/07/2006  T:  12/08/2006  Job:  [id number]   cc:   Holley Bouche, M.D.  Fax: [phone]

## 2010-06-22 NOTE — Discharge Summary (Signed)
[name], [name]            ACCOUNT NO.:  [id number]   MEDICAL RECORD NO.:  [id number]          PATIENT TYPE:  INP   LOCATION:  1307                         FACILITY:  [facility]   PHYSICIAN:  Kela Millin, M.D.DATE OF BIRTH:  November 27, 1940   DATE OF ADMISSION:  11/27/2006  DATE OF DISCHARGE:  11/30/2006                               DISCHARGE SUMMARY   DISCHARGE DIAGNOSES:  1. Acute bronchitis.  2. Diabetes mellitus.  3. Hypertension.  4. History of bladder spasms.  5. Cerebral palsy.   BRIEF HISTORY:  The patient is a 70 year old white female with the above-  listed medical problems who presented with complaints of shortness of  breath and a cough productive of yellowish sputum for a few days.  It  was noted that the patient had been in the ER the day prior to  admission, and at that time a chest x-ray was done which showed no  evidence of a pneumonia.  She was sent to home on oral antibiotics for  presumed bronchitis.  She returned on the day of admission with  worsening of her shortness of breath and a productive cough.  Her lab  work revealed a white cell count of 11.1, neutrophil count of 87%.  Chest x-ray was also negative for acute infiltrates, right basilar  atelectasis noted.  Her initial temperature was 104.5, with a pulse of  118, and her O2 saturations on 6 liters of oxygen were 96%.  She  appeared somnolent at the time she was examined.  She was admitted for  further evaluation and management.   Please see the admission history and physical for further details of the  admission physical exam as well as laboratory data, dictated on November 27, 2006 per Dr. Rito Ehrlich.   HOSPITAL COURSE:  1. Acute bronchitis with hypoxia.  It was noted in the ER that the      patient's O2 saturations on 6 liters of nasal cannula oxygen were      96%, no O2 saturations documented on room air.  An ABG on 2 liters      nasal cannula oxygen revealed a pH of 7.29, with a PCO2 of 45,  PO2      of 102, and O2 saturation of 97.5.  Chest x-ray was done, and the      results are, as stated above, atelectasis, but no acute infiltrates      noted.  The patient was empirically placed on antibiotics and also      nebulized bronchodilators as needed.  Speech therapy was consulted      for a swallowing evaluation, and they noted a moderate      oropharyngeal dysphagia with frank laryngeal penetration of thin      liquids.  Following this, her diet was changed to a dysphagia II,      nectar-thick liquid, modified carbohydrate.  The patient's symptoms      have improved, she has remained afebrile, and her last white cell      count was within normal limits at 6.  She is oxygenating well on  room air.  Her last O2 saturations were 100% on room air.  She will      be discharged to complete her antibiotic course outpatient and      follow up with her primary care physician.  2. Diabetes mellitus.  The patient was maintained on her outpatient      medications.  Her Accu-Cheks were monitored.  She was also covered      with sliding-scale insulin.  She is to continue her outpatient      medications upon discharge.  3. Hypertension.  She is to continue her outpatient medications upon      discharge.  4. History of bladder spasms.  The patient was maintained on Detrol      during her hospital stay.  5. Cerebral palsy.  The patient is to continue home health as well as      CAPS services upon discharge.  6. Dysphagia.  As above.  She will be discharged on a dysphagia II      diet with nectar-thick liquids.  Speech therapy is to follow her      outpatient and to repeat a modified barium swallow and make further      recommendations as appropriate.   DISCHARGE MEDICATIONS:  1. Avelox 400 mg p.o. daily x5 more days.  2. Guaifenesin 600 mg p.o. b.i.d.  3. Claritin-D 1 tablet daily  4. Patient to continue hydralazine, metformin, Paxil, simvastatin,      Lyrica, Vicodin, clonazepam,  Detrol, and hydroxyzine as previously.   FOLLOW-UP CARE:  1. Dr. Holley Bouche in 1 week.  2. Patient to continue home health services.  Also, home health speech      therapy as above, and CAPS services to be continued.   DISCHARGE CONDITION:  Improved/stable.      Kela Millin, M.D.  Electronically Signed     ACV/MEDQ  D:  11/30/2006  T:  12/01/2006  Job:  [id number]   cc:   Holley Bouche, M.D.  Fax: [phone]

## 2010-06-25 NOTE — H&P (Signed)
NAME:  [name], [name] NO.:  [id number]   MEDICAL RECORD NO.:  [id number]                   PATIENT TYPE:  EMS   LOCATION:  ED                                   FACILITY:  [facility]   PHYSICIAN:  Sherin Quarry, MD                   DATE OF BIRTH:  27-Mar-1940   DATE OF ADMISSION:  06/18/2003  DATE OF DISCHARGE:                                HISTORY & PHYSICAL   [name] is a very unfortunate 70 year old lady who has numerous  problems.  First, she is chronically incontinent of bowels and bladder.  This presents her with significant hygiene problems.  Secondly, she has been  nonambulatory for one year and gets around with a wheelchair.  This  practically means that she almost never gets out of bed.  She has been  diagnosed with diabetes which is currently being regulated with a  combination of oral medications and insulin, which is given on a p.r.n.  basis.  Her blood sugar is very poorly regulated.  The patient calls the  ambulance to her house once or possibly twice daily.  She does this because  of concern about her blood sugar becoming elevated and also because she  becomes frightened when her arms or legs shake.  Apparently, the ambulance  crew has understandably become very reluctant to continue to come to see her  on such a frequent basis.  Her husband is present at home but apparently is  not able to provide a lot of help in terms of caring for her.  The patient  also makes very frequent trips to the emergency room.  Yesterday, she  presented to the emergency room with urinary frequency and was noted to have  evidence of a urinary tract infection.  An antibiotic was prescribed, but  she has not taken any as of yet.  I gather that there was a conference  between Dr. Tiburcio Pea and the EMS personnel.  A decision was made to go out to  her house, pick her up, and bring her to [facility] emergency room with the  idea that she would be admitted and  that nursing home placement would be  arranged.  She indicates that she has had no recent problems with headache,  breathing difficulty, chest pain, nausea, vomiting, abdominal pain, or any  change in her usual spasticity.   CURRENT MEDICATIONS:  1. Klonopin 1 mg t.i.d.  2. Ditropan 5 mg every 8 hours.  3. Glucophage 500 mg daily.  4. Neurontin 300 mg t.i.d.  5. Amaryl 4 mg b.i.d.  6. Paxil 40 mg daily.  7. Vistaril p.r.n.  8. She also uses insulin on a p.r.n. basis that I am not quite clear about,     from her description.   She states that she is intolerant of MORPHINE.   PAST MEDICAL HISTORY:  1. Patient has a longstanding history of  recurrent urinary tract infections.     Formerly, she used a Foley catheter, but this made this problem worse.  2. Type 2 diabetes, uncontrolled.  3. Cerebral palsy with right-sided spasticity.  4. Chronic constipation.  5. History of left forearm amputation, status post injury.  6. Chronic peripheral neuropathy.  7. Gastroesophageal reflux.  8. Chronic anxiety and depression.  9. History of basal cell cancer.  10.      History of left nephrectomy secondary to renal cell cancer in 2000.  11.      Right knee surgery in March, 2003.  12.      Paronychia.  13.      History of shingles.  14.      History of aspiration pneumonia.  15.      Allergic rhinitis.   PAST SURGICAL HISTORY:  She has had a previous hysterectomy, left  nephrectomy, knee operation, and left arm amputation.   ALLERGIES:  She is allergic to SULFA drugs, MORPHINE, and CODEINE.   SOCIAL HISTORY:  She does not smoke.  She denies the use of alcohol.  As  mentioned, she is married.   FAMILY HISTORY:  She is adopted.  She does not know anything about her  family history.   REVIEW OF SYSTEMS:  HEAD:  She denies headache or dizziness.  EYES:  She  denies visual blurring or diplopia.  ENT:  Denies sinus pain, earache, or  sore throat.  CHEST:  Denies coughing, wheezing, or chest  congestion.  CARDIOVASCULAR:  Denies orthopnea, PND, or ankle edema.  GI:  There has been  no nausea, vomiting, abdominal pain, or change in bowel habits.  She has  chronic fecal and urinary incontinence.  GU:  See above.  NEURO:  See above.  END:  See above.   PHYSICAL EXAMINATION:  GENERAL:  She is alert and cooperative.  She is very  anxious.  HEENT:  Within normal limits.  LUNGS:  Clear to auscultation and percussion.  BACK:  Examination of the back reveals no CVA or point tenderness.  HEART:  Normal S1 and S2 without murmurs, rubs or gallops.  ABDOMEN:  Soft with normal bowel sounds.  Without masses or tenderness.  There is no rebound or guarding.  EXTREMITIES:  She has had an amputation of the left upper extremity at the  level of the distal portion of the forearm.  She has surgical scars in both  lower legs.  NEUROLOGIC:  She is oriented x 3.  She has right-sided spasticity.   IMPRESSION:  1. Urinary tract infection.  2. Cerebral palsy.  3. Numerous social issues.  4. Uncontrolled diabetes.  5. Chronic constipation.  6. History of left forearm amputation.  7. Gastroesophageal reflux.  8. Chronic anxiety and depression.  9. History of left nephrectomy.  10.      Aspiration pneumonia.   As per plan, we will admit [name] to the hospital.  We will start her on  treatment for her urinary tract infection.  We will try to get her blood  sugar better regulated.  She will need chronic placement, if possible, as  apparently she is no longer able to provide self-care in the home  environment.                                               Sherin Quarry, MD  SY/MEDQ  D:  06/18/2003  T:  06/18/2003  Job:  [id number]   cc:   Holley Bouche, M.D.  [address]  [place], [place] [address]  Fax: [phone]

## 2010-06-25 NOTE — Consult Note (Signed)
[facility]  Patient:    [name], [name] [facility]                 MRN: [id number] Proc. Date: 03/30/00 Adm. Date:  [id number] Attending:  Janifer Adie CC:         Arvella Merles, M.D.   Consultation Report  PATIENTS ADDRESS [address] [place], [place]  [address]  DATE OF BIRTH:  [birth year].  REASON FOR ADMISSION:  This 70 year old right-handed white married female was admitted on the evening of March 29, 2000 with complaints of "I cant walk."  HISTORY OF PRESENT ILLNESS:  This patient has a history of multiple neurologic disorders and medical problems.  She has a known history of benign essential tremor and in 1971, underwent a right craniotomy with pallidotomy for a tremor disorder complicated by postop left hemiparesis with spasticity.  This was associated with falls and injury to her right hand and arm and subsequent left above-elbow amputation and left knee fixation.  She has been on Neurontin for chronic pain syndrome and has had a known history of renal cell carcinoma and diabetes mellitus and lumbosacral spinal stenosis and depression.  She has had multiple falls and has been seen by Dr. Marlan Palau as an outpatient at [facility].  She had injured her right shoulder in late October and then recently had a malleolar fracture on the left.  She is status post removal of a basal cell carcinoma to her right foot and has had enlarged parotid glands, raising the question of Sjogrens syndrome.  She has been living in a condominium with her husband with a hospital bed with rails and bars in the shower for assistance and one level without ramps for access.  MEDICATIONS:  Her medications at home have included:  1. Neurontin 300 mg in the morning, 300 mg at noon and 600 mg at night.  2. Amaryl 4 mg two q.d.  3. Glucophage XR 500 mg q.d.  4. Paxil 20 mg q.a.m.  5. Novolin insulin 70/30,  40 units in the morning and 35 units in the     evening.  6. Elavil 50 mg q.h.s.  7. Klonopin 2 mg t.i.d.  8. Phenergan p.r.n.  9. Sonata p.r.n. 10. Vioxx p.r.n.  ALLERGIES:  She has a history of allergies to SULFA and MORPHINE.  PAST MEDICAL HISTORY:  Her past medical history is significant for status post right craniotomy in the 1970s with resulting left hemiparesis.  She has had amputation to left arm above the elbow for spasticity and pain, September 1994.  She has diabetes mellitus and has had a diabetic nephropathy by EMG and nerve conduction studies, September 1999, with hemoglobin A1cs of 8.1.  She is status post left knee fusion for chronic pain.  She is status post hysterectomy.  She has had renal cell carcinoma with left nephrectomy in July of 2000.  She is status post basal cell carcinoma to her back and the sole of her right foot with surgery in July of 2001.  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  She has had enlarged parotid, raising question of Sjogrens syndrome, but she is on amitriptyline.  She is status post left breast biopsy for fibroadenoma.  She has had spastic neurogenic bladder with incontinence and she had a fracture of her right humerus, December 04, 1999, and a fracture to her left malleolus in February 2002.  PHYSICAL EXAMINATION  GENERAL:  Examination revealed a well-developed, very lethargic white female who could be aroused.  She is status post right craniotomy, left knee fusion and left arm amputation above the elbow.  She is status post right sole surgery of her foot for basal cell carcinoma.  VITAL SIGNS:  Her blood pressure in right arm was 130/80.  The heart rate was 72.  No bruits were heard.  She was afebrile.  NEUROLOGIC:  Mental status:  She was lethargic but when aroused, was oriented x 3, followed commands and had good remote memory and could give a history. Her cranial nerve examination revealed the pupils  reacted from 6 to 4 bilaterally.  The visual fields were full.  The disks were flat.  She had a left central seventh.  Her tongue was midline.  The uvula was midline.  Gags were present.  She had a dysarthria, hoarseness, decreased hearing, with air conduction greater than bone conduction on the left and bone conduction greater than air conduction on the right.  Motor examination revealed good strength in her right arm with giveaway strength in her right hand to grip testing.  She had decreased motion of her right arm at the shoulder and she had decreased shoulder shrug on the left as compared to the right.  She could not lift her right leg off the bed; she could lift her left leg off the bed and had a fracture walking boot to her left foot and leg.  She had at least 4/5 strength in the iliopsoas, quadriceps femoris, hamstring, EHL, anterior tibialis in her right foot as compared to her left; I could, however, not get her to lift her right leg off the bed well.  Her sensory examination was intact to pinprick.  She has good two-point discrimination in her hands.  She had no differential between pinprick from the left side of her body to the right side of her body.  Deep tendon reflexes were all present in her right arm and in both knees and at ankles.  The plantar responses were downgoing. Gait was not tested.  She did have an outstretched hand and arm tremor.  IMPRESSION  1. Gait disorder with multiple falls, 781.2; this is of multifactorial     etiology, possibly related to medication, left hemiparesis, left knee     surgery, diabetic nephropathy and lumbosacral spinal stenosis.  2. Right brain lesion with left hemiparesis, status post right attempted     pallidotomy in 1971, code 342.10.  3. Benign essential tremor, code 333.1.  4. 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  5. Old left knee fusion.  6. Diabetes mellitus with neuropathy, code 357.2.  7. Lumbosacral stenosis, code 724.02.  8.  Chronic pain syndrome on the left with causalgia of her left hand and arm.   9. Renal cell carcinoma, code 189.0. 10. Basal cell carcinoma, code unknown.  PLAN:  Plan at this time is to do an MRI/MRA of the brain, MRI of the lumbar spine and taper her off the Klonopin.  A B12, VDRL and TSH will be obtained to help with her gait disorder evaluation. DD:  03/30/00 TD:  03/30/00 Job: [id number]

## 2010-06-25 NOTE — Op Note (Signed)
[place]  Patient:    [name], [name] Visit Number: [id number] MRN: [id number]          Service Type: DSU Location: [facility] Attending Physician:  Cornell Barman Dictated by:   Lenard Galloway Chaney Malling, M.D. Proc. Date: 04/17/01 Admit Date:  04/17/2001 Discharge Date: 04/18/2001                             Operative Report  PREOPERATIVE DIAGNOSIS:  Severe chondromalacia, right patella.  POSTOPERATIVE DIAGNOSIS:  Severe chondromalacia, right patella, with grade 2 and grade 3 cartilage damage in the femoral trochlear notch area.  PROCEDURE:  Patellectomy and reconstruction of extensor mechanism per Bertram Millard.  ANESTHESIA:  General.  SURGEON:  Rodney A. Mortenson, M.D.  DESCRIPTION OF PROCEDURE:  Patient placed on the operating table in the supine position and a pneumatic tourniquet applied to the right upper thigh.  The leg was prepped with Duraprep and then draped out in the usual manner.  The leg was then wrapped out with an Esmarch and the tourniquet was elevated.  An incision was started above the patella and carried down to the tibial tubercle.  Skin edges were retracted and bleeders were coagulated.  An incision was made about 1 cm medial to the patella.  This released vastus medialis.  At this point very careful dissection of the patella was done. Subperiosteal dissection of the entire patella was done to retain as much of the extensor mechanism and tendinous portion as possible.  Once this was completed, the medial edge of the extensor mechanism was rolled back on itself and sutured back on itself to form a tube from proximal to distal.  The tube was reconstructed similarly to a new patellar tendon.  This was sutured with heavy #2 Tycron sutures.  The vastus medialis was then advanced laterally and advanced to the top of the tube in position with heavy interrupted Tycron sutures once again.  This reconstructed the extensor  mechanism and allowed for the extensor mechanism to stay centralized in the femoral notch area as a pseudopatella.  The knee was put through a full range of motion.  All sutures seemed to hold very nicely.  The subcutaneous tissue was closed with 2-0 Vicryl and the skin was closed with stainless steel staples.  It should be noted that the patient had significant chondromalacia of the patella and some grade 2 and grade 3 cartilage damage in the femoral notch area.  Sterile dressings were then applied to the wound, a pressure dressing, and a knee immobilizer.  The patient returned to the recovery room in excellent condition.  Technically this procedure went extremely well.  FOLLOW-UP CARE: 1. To recovery care center overnight. 2. Discharge in a.m. 3. Return to my office on Monday. 4. Knee immobilizer. Dictated by:   Lenard Galloway Chaney Malling, M.D. Attending Physician:  Cornell Barman DD:  04/17/01 TD:  04/18/01 Job: [id number]

## 2010-06-25 NOTE — Discharge Summary (Signed)
NAME:  CLEMENCIA, HELZER                      ACCOUNT NO.:  [id number]   MEDICAL RECORD NO.:  [id number]                   PATIENT TYPE:  INP   LOCATION:  0380                                 FACILITY:  [facility]   PHYSICIAN:  Sherin Quarry, MD                   DATE OF BIRTH:  02-Feb-1941   DATE OF ADMISSION:  05/18/2002  DATE OF DISCHARGE:  05/20/2002                                 DISCHARGE SUMMARY   HISTORY:  Keirston Saephanh is a 70 year old lady with numerous medical  problems, including diabetes, cerebral palsy, history of forearm amputation,  chronic neuropathy, anxiety, and depression.  She also has a history of a  left nephrectomy secondary to renal cell cancer.  The patient presented on  04/11 with a temperature associated with left lower quadrant pain.  She  described a feeling of constipation.   PHYSICAL EXAMINATION:  (As described by Pearla Dubonnet, M.D., who  admitted the patient:)  VITAL SIGNS:  At the time that Dr. Kevan Ny saw the patient, the temperature  was 99.6, pulse 114, respirations 20, blood pressure 124/78.  HEENT:  Within normal limits.  CHEST:  Clear.  CARDIOVASCULAR:  Normal S1 and S2.  No rubs, murmurs, or gallops.  ABDOMEN:  Benign.  There were normal bowel sounds.  There was mild  tenderness to deep palpation in the left lower quadrant.  BUTTOCKS:  Dr. Kevan Ny examined the patient's buttock area and did not detect  a rash.  NEUROLOGIC:  Examination of extremities was unremarkable.   RELEVANT LABORATORY TESTS OBTAINED:  Basic metabolic profile which revealed  a sodium of f136, potassium 3.6, glucose 230, creatinine 1.1.  White count  10,000, hemoglobin 12.8.  Serial blood cultures were obtained which were  negative.  A urine culture is pending at this time.  Chest x-ray was within  normal limits.  A CT scan of the chest was obtained.  I reviewed this result  with the radiologist.  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  There were  no abscesses,  no mass lesions.  In fact, no significant abnormalities were  detected.  By 04/11, the patient was feeling much better.  She had  essentially no abdominal pain at that point.  By 04/12, she seemed to be  eating normally and seemed to have a return to her normal status.  Therefore, on 04/12, the decision was made to discharge the patient.   DISCHARGE DIAGNOSES:  1. Left lower quadrant pain, possibly secondary to diverticulitis.  2. History of recurrent urinary tract infections.  3. Type 2 diabetes.  4. History of cerebral palsy.  5. Chronic constipation.  6. History of forearm amputation.  7. Neuropathy.  8. Gastroesophageal reflux.  9. Anxiety and depression.  10.      History of left nephrectomy for renal cell cancer.  11.      Recent buttock rash.   DISCHARGE MEDICATIONS:  1. Same as those the patient has been receiving previously.  These consist     of Neurontin 300 mg t.i.d.  2. Metformin 500 mg daily.  3. Klonopin 1 mg t.i.d.  4. Ditropan 5 mg b.i.d.  5. Paxil 40 mg daily.  6. Ambien 10 mg q.h.s. p.r.n. sleep.  7. Amaryl 8 mg daily.  8. NovoLog insulin which the patient uses per a sliding scale.  9. Atarax p.r.n. itching.  10.      In addition, the patient will be instructed to complete a 7 day     course of empiric antibiotic therapy for possible diverticulitis.  This     will consist of ciprofloxacin in the form of Cipro XR 1 g daily and     Flagyl 500 mg b.i.d.   CONDITION AT TIME OF DISCHARGE:  Fair.                                               Sherin Quarry, MD    SY/MEDQ  D:  05/20/2002  T:  05/20/2002  Job:  [id number]   cc:   Holley Bouche, M.D.  [address]  [place], [place] [address]  Fax: [phone]

## 2010-06-25 NOTE — Consult Note (Signed)
[facility]  Patient:    [name], [name] [facility]                 MRN: [id number] Proc. Date: 03/30/00 Adm. Date:  [id number] Attending:  Janifer Adie CC:         Arvella Merles, M.D.   Consultation Report  DATE OF BIRTH:  March 14, 1940  This 70 year old right-handed white married female was admitted on the evening of March 29, 2000 with complaints of "I cant walk."  HISTORY OF PRESENT ILLNESS:  This patient has a history of multiple neurologic disorders and medical problems.  She has a known history of benign essential tremor and in 1971 underwent a right craniotomy with pallidotomy for a tremor disorder complicated by postoperative left hemiparesis with spasticity.  This was associated with falls and injury to her right hand and arm and subsequent left above-elbow amputation and left knee fixation.  She has been on Neurontin for chronic pain syndrome and has had a known history of renal cell carcinoma and diabetes mellitus and lumbosacral spinal stenosis and depression.  She has had multiple falls and has been seen by Dr. Anne Hahn as an outpatient at [facility].  She has injured her right shoulder in late October and recently had a malleolar fracture on the left.  She is status post removal of a basal cell carcinoma to her right foot and has enlarged parotid glands raising the question of Sjogrens syndrome.  She has been living in a condominium with her husband with a hospital bed with rails and bars in shower for assistance in one level without ramps for access.  MEDICATIONS:  1. Neurontin 300 mg q.a.m., 300 mg at noon, 600 mg q.p.m.  2. Amaryl 4 mg two q.d.  3. Glucophage XR 500 mg q.d.  4. Paxil 20 mg q.a.m.  5. Novolin insulin 70/30 40 units q.a.m., 35 units q.p.m.  6. Elavil 50 mg q.h.s.  7. Klonopin 2 mg t.i.d.  8. Phenergan p.r.n.  9. Sonata p.r.n. 10. Vioxx p.r.n.  ALLERGIES:  SULFA and  MORPHINE.  PAST MEDICAL HISTORY:  Significant for status post right craniotomy in the 1970s with resulting left hemiparesis.  She has had amputation to left arm above the elbow for spasticity and pain September 1994.  She has diabetes mellitus and has had a diabetic neuropathy by EMG and nerve conduction studies September 1999 with hemoglobin A1Cs of 8.1.  She is status post left knee fusion for chronic pain.  She is status post hysterectomy.  She has had renal cell carcinoma with left nephrectomy in July 2000.  She is status post basal cell carcinoma to her back and the sole of her right foot with surgery in July 2001.  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 She has had enlarged parotids raising question of Sjogrens syndrome but she is on amitriptyline.  She is status post left breast biopsy for fibroadenoma. She has had spastic neurogenic bladder with incontinence.  She has had a fracture at her right humerus December 04, 1999 and a fracture to her left malleolus in February 2002.  PHYSICAL EXAMINATION  GENERAL:  Well-developed, very lethargic white female who could be aroused. She was status post right craniotomy, left knee fusion, left arm amputation above the elbow.  She is status post right sole surgery of her foot for basal cell carcinoma.  VITAL SIGNS:  Blood pressure right arm 130/80, heart rate 72.  No bruits were heard.  She was afebrile.  NEUROLOGIC:  Mental status:  She was lethargic, but when aroused disoriented x 3.  Followed commands.  Had good remote memory.  Could give history.  Cranial nerve examination revealed the pupils reactive from 6-4 bilaterally.  The visual fields were full.  The disks were flat.  She had a left central seventh.  Tongue was midline.  Uvula was midline.  Gags were present.  She had dysarthria, hoarseness, decreased hearing with air conduction greater than bone conduction on the left and bone conduction greater  than air conduction on the right.  Motor examination revealed good strength in her right arm with giveaway strength in her right hand to grip testing.  She had decreased motion of her right arm at the shoulder.  She had decreased shoulder shrug on the left as compared to the right.  She could not lift her right leg off the bed. She could lift her left leg off the bed and had a fracture walking boot to her left foot and leg.  She had at least 4/5 strength in the iliopsoas, quadriceps, femorals, hamstrings, EHL, anterior tibialis in her right foot as compared to her left.  I could, however, not get her to lift her right leg off the bed well.  Sensory examination was intact to pin prick.  She had good two point discrimination in her hands.  She had no differential between pin pricks from the left side of her body to the right side of her body.  Deep tendon reflexes were all present in the right arm, both knees, and ankles.  Plantar responses were downgoing.  Gait was not tested.  She did have an outstretched hand and arm tremor.  IMPRESSION:  1. Gait disorder with multiple falls (781.2).  This is multifactorial     etiology, possibly related to medication, left hemiparesis, left knee     surgery, diabetic neuropathy, and lumbosacral spinal stenosis.  2. Right brain lesion with left hemiparesis status post right attempted     pallidotomy in 1971 (342.10).  3. Benign essential tremor (333.1).  4. Multiple sedative use (348.3).  5. Old left knee fusion.  6. Diabetes mellitus with neuropathy (357.2).  7. Lumbosacral stenosis (724.02).  8. Chronic pain syndrome on the left with causalgia of her left hand and arm.  9. Renal cell carcinoma (189.0). 10. Basal cell carcinoma (code unknown).  PLAN:  Do an MRI/MRA of the brain, MRI of the lumbar spine, and taper her off the Klonopin.  A B12, VDRL, and TSH will be obtained to help with her gait disorder evaluation.  Thank you. DD:  03/30/00 TD:   03/30/00 Job: [id number]

## 2010-06-25 NOTE — H&P (Signed)
[name], [name]                      ACCOUNT NO.:  [id number]   MEDICAL RECORD NO.:  [id number]                   PATIENT TYPE:  INP   LOCATION:  0451                                 FACILITY:  [facility]   PHYSICIAN:  Melissa L. Ladona Ridgel, MD               DATE OF BIRTH:  [birth year]   DATE OF ADMISSION:  03/26/2003  DATE OF DISCHARGE:  03/28/2003                                HISTORY & PHYSICAL   CHIEF COMPLAINT:  Fever.   PRIMARY CARE PHYSICIAN:  Holley Bouche, M.D.   HISTORY OF PRESENT ILLNESS:  The patient is a 70 year old white female with  cerebral palsy who presents in the emergency room with fever of 101.7 which  started last p.m.  She complained of breathing hard, cough but no sputum,  nausea, no vomiting, some abdominal pain, some dysuria, no diarrhea and some  constipation. She otherwise states that she is at her baseline.   PAST MEDICAL HISTORY:  Significant for cerebral palsy, benign tremor,  diabetes, poor vision, wheelchair bound and a sore bottom.   PAST SURGICAL HISTORY:  She has had her knees done, Dr. Jon Billings is taking  care of basal cell carcinoma in her foot and she has had a history of kidney  cancer.   ALLERGIES:  No known drug allergies.   SOCIAL HISTORY:  She denies tobacco or ethanol.   FAMILY HISTORY:  Her mother is deceased secondary to cancer but this was her  adoptive mother and her dad is deceased secondary to an MI.   REVIEW OF SYMPTOMS:  As above, all else were negative.   MEDICATIONS:  1. Clonazepam 1 mg t.i.d.  2. Triamcinolone  to rash on back  t.i.d.  3. Ditropan 5 mg q. 8h.  4. Metformin ER 500 mg q.d.  5. Gabapentin  300, 1 t.i.d.  6. Amaryl 4 mg, 2 tablets p.o. q.d.  7. Paxil 40 mg once daily.  8. Nitrofurantoin 1 tablet q.h.s. for three months.  9. Hydroxyzine 25 mg q. 6h.   PHYSICAL EXAMINATION:  VITAL SIGNS:  100.1, blood pressure 158/87, pulse  114, respiratory rate 20, sat 96% on room air.  GENERAL:  She is in no  acute distress.  HEENT:  Pupils equal round and reactive to light.  Extraocular movements  intact. Mucous membranes are moist.  CHEST:  Clear to auscultation. There is no rhonchi, rales or wheezes.  CARDIOVASCULAR:  Regular rate and rhythm, positive S1, S2, no S3, S4. She is  a little bit tachy.  No murmurs, rubs or gallops are observed.  ABDOMEN:  Obese, nontender, nondistended with positive bowel sounds. She has  a well healed scar on her abdomen secondary to her kidney cancer.  EXTREMITIES:  The right great toe shows perionychia and the toe nail is  growing into the bed, there is pus under the bed.  2+ pulses plus basal cell  carcinoma of the right sole of her foot.  She has a left arm amputation.  NEUROLOGIC:  She has benign tremors, she has some weakness, left greater  than right but the sensation is grossly intact.  Cranial nerves II-XII  appeared to be intact.  She has a left facial droop.   LABORATORY DATA:  Reveal a white count of 7.0, hemoglobin of 14.1,  hematocrit of 43.2, platelets of 241, sodium is 132, potassium is 4.4,  chloride is 100, CO2 is 24, BUN is 13, creatinine is 1.1, glucose is 301.  Her urinalysis is negative.   Her chest x-ray shows no active disease and some atelectasis.   ASSESSMENT/PLAN:  This is a 70 year old white female with a fever of unclear  origin in the face of right great toe perionychia.  1. ID:  There is superficial pus around the great toe and toe nail. Cultures     have been sent, the area has been I&D'd and cleaned with H2O2 and treated     with topical Neosporin. The surgery is to see and excise the nail.  We     will start her on Keflex 500 mg p.o. q.i.d.  2. GU:  We continue nitrofurantoin as prophylaxis for urinary tract     infection.  3. GI:  She will have a carb modified diet with no other issues at hand.  4. Endocrine:  Will continue her metformin, sliding scale insulin and     titrate as necessary.                                                Melissa L. Ladona Ridgel, MD    MLT/MEDQ  D:  04/02/2003  T:  04/02/2003  Job:  [id number]   cc:   Holley Bouche, M.D.  [address]  [place], [place] [address]  Fax: [phone]

## 2010-06-25 NOTE — Consult Note (Signed)
[place]  Patient:    [name], [name] Visit Number: [id number] MRN: [id number]          Service Type: EMS Location: [facility] Attending Physician:  Osvaldo Human Dictated by:   Deanna Artis. Sharene Skeans, M.D. Proc. Date: 12/17/00 Admit Date:  12/17/2000   CC:         Marlan Palau, M.D.  Arvella Merles, M.D.   Consultation Report  DATE OF BIRTH:  04-Feb-1941  CHIEF COMPLAINT:  Difficulty walking, incontinence.  HISTORY OF PRESENT CONDITION:  [name] is a 70 year old woman who is a primary patient of Dr. Holley Bouche and a specialty patient of Dr. Lesia Sago.  She has had a long-standing history of difficulty with her gait and walks with a walker.  Today she went to visit her mother and getting out of the car slipped and fell.  Fortunately, she did not hurt herself.  She was scared, however, and worried that her legs are becoming weaker and felt that there was something wrong with her brain that needed to be examined.  Patient has also had significant problems with incontinence.  Today, however, when I asked her the last time she had an accident she could not tell me.  PAST MEDICAL HISTORY:  Extensive.  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  She has had significant arthritis of both legs.  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  She has had chronic neuritis, essential tremor, and bladder dysfunction of unknown etiology.  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  She had had her fingers fused and somehow fell and broke the arm. It never healed well and ultimately had to be removed.  I do not know if she developed gangrene, osteomyelitis, or what happened.  She had her left knee fused in 1985.  She has had arthroscopic surgery on the right knee in 2002 and hysterectomy in  1972.  She also had surgery to remove the cancerous kidney and to remove the squamous cell carcinoma.  REVIEW OF SYSTEMS:  The patient has had no fever, cough, rhinorrhea.  She has complaints of stomach pain, but has not had nausea or vomiting.  She has frequent diarrhea.  She said that she had a large bowel movement today.  She has had no bleeding, dyscrasias, skin, GI, or GU.  NEUROLOGIC:  The patient complains of blurred and double vision which is chronic.  She has had weakness in her legs, significant tremor in her sole hand.  She has not had any seizures.  She has frequent headaches.  Review of systems is otherwise negative except as noted above.  CURRENT MEDICATIONS:  Clonazepam, Neurontin, Ditropan.  There are other medications that she takes but she did not bring them with her.  ALLERGIES:  None known.  FAMILY HISTORY:  The patient was adopted.  SOCIAL HISTORY:  The patient is in her third marriage.  The first died in a mobile home fire and was an alcoholic.  The second died of cirrhosis.  The third is obese and does not take very good care of himself.  He is at home currently.  Husband is 27 years of age, works for the [employer].  She is home with a sitter between 9 a.m. and 3 p.m. under ______ funding.  PHYSICAL EXAMINATION  GENERAL:  This is a blonde haired woman who appears her stated age.  She is obese.  She is comfortable lying on the bed.  VITAL SIGNS:  Resting pulse 84 and regular, respirations 20, blood pressure and temperature had not yet been done.  HEENT:  No signs of infection in the oropharynx, tympanic membranes, conjunctivae.  She claims to have Sjogrens disease.  There is some moisture in her conjunctivae and they are not inflamed.  NECK:  Supple.  Full range of motion.  No cranial or cervical bruits.  LUNGS:  Clear to auscultation.  HEART:  No murmurs.  Pulses normal.  ABDOMEN:  Protuberant, nontender.  Bowel sounds normal.   No hepatosplenomegaly.  EXTREMITIES:  The right arm and leg are normal.  The left arm shows an amputation in mid forearm.  The left knee is fused.  She has lesions indicating arthroscopic surgery in the right knee.  She has no edema or cyanosis in her legs.  She has decent range of motion in her right leg and in her left ankle and hip.  NEUROLOGIC:  Mental status:  Patient was awake, alert, somewhat anxious without dysphagia or dyspraxia.  She is right-handed.  Cranial nerves:  Round reactive pupils.  Normal fundi.  Full visual fields to double simultaneous stimuli.  She has mild photophobia.  Extraocular movements are full and conjugate.  Okay end responses bilaterally.  Symmetric facial strength. Midline tongue and uvula.  Air conduction greater than bone conduction bilaterally.  Motor examination:  Patient shows normal strength in her right upper extremity both proximally and distally.  She has mild bradykinesia and clumsiness in finger tapping and opposing her thumb with her fingers.  She has a definite essential tremor that involves her arms and head.  The left arm shows fairly strong deltoid and is otherwise fused at the elbow.  The right leg shows excellent strength psoas, hip flexor, knee flexor and extensor, foot dorsiflexor and plantar flexor.  She has excellent strength in her left foot dorsiflexor and plantar flexor.  I was not able to test the knee because it is fused.  She can lift the leg off the table.  Sensation shows no peripheral neuropathy (which surprised me).  She had good vibratory and proprioceptive sense and normal stereoagnosis in the right hand. Cerebellar examination shows some slowness, but no true tremor dystaxia or dysmetria.  Gait is antalgic.  The patient was able to walk under her own power.  She walks very slow with a shuffle and tends to drag the left leg. Interestingly, when it came time to get back on the table she nearly hopped up  on it  surprising both myself and the nurse who was with me.  Deep tendon reflexes were normal at the patella, diminished at the right Achilles and left Achilles, normal at the biceps, diminished at the triceps and brachioradialis.  Patient had bilateral flexor plantar responses.  I saw no evidence of sensory level.  I did not do a rectal examination.  IMPRESSION:  Gait disorder which I believe is mechanical and has more to do with her orthopedic problems than myelopathy, stroke, or radiculopathy.  The patient is able to get around with a walker.  I see no reason to admit her to the hospital tonight.  The patient thanked me and as I was leaving the room she said that her stomach was bothering her and asked if she could get pain medicine for the stomach.  I am unable to see any  particular abnormality in her stomach and without knowing the other medications she takes am reluctant to place her on any additional medicine at this time.  I had Guilford Shi see her and he is seeing her as this is being dictated.  I would refer her to Dr. Tiburcio Pea for any further workup of her abdomen.  I will speak to Dr. Lesia Sago about the patient. She is entering a time in her life when she may not be able to stay at home, although, her problems tend to wax and wane and as she is reassured she seems to do better.  I do not believe the patient needs any neurodiagnostic imaging at this time and told her so.  She will be able to follow up with Dr. Anne Hahn in the office at her routine regularly scheduled visit which I think is in early December. Dictated by:   Deanna Artis. Sharene Skeans, M.D. Attending Physician:  Osvaldo Human DD:  12/17/00 TD:  12/18/00 Job: [id number]

## 2010-06-25 NOTE — H&P (Signed)
[place]  Patient:    [name], [name] Visit Number: [id number] MRN: [id number]          Service Type: MED Location: 3100 [id number] Attending Physician:  Hinda Glatter Dictated by:   Rosanne Sack, M.D. Admit Date:  04/19/2001   CC:         Arvella Merles, M.D., [facility]  Armstead Peaks, M.D.   History and Physical  DATE OF BIRTH:  March 31, 1940  PROBLEM LIST:  1. Right upper lobe aspiration pneumonia with septicemia.  2. Uncontrolled type 2 diabetes mellitus, insulin dependent.  3. Dehydration.  4. Prerenal azotemia.     a. Baseline BUN 10, creatinine 1.2.  5. Urinary tract infection versus pyelonephritis, urinalysis consistent with     gross pyuria.  6. Chronic pain syndrome.     a. Multiple emergency room visits.  7. Cerebral palsy, mild.  8. Essential tremor.     a. Status post brain surgery at [facility] in 1971 for this problem, followed by        right-sided spasticity.     b. Status post left forearm amputation.  9. Chronic neuritis. 10. Gastroesophageal reflux disease. 11. Depression and anxiety. 12. History of renal cell carcinoma.     a. Status post left nephrectomy in July 2000. 13. Status post basal cell carcinoma of the skin.     a. Status post removal x2 in the back and the foot in July 2001. 14. History of parotiditis in September 2001.     a. ? Sjogrens syndrome. 15. Recent knee surgery on the right with status post patellotomy and     reconstruction of the right knee structures on April 17, 2001. 16. Osteoarthritis of the knees. 17. Allergy to sulfa drugs and morphine intolerance.  CHIEF COMPLAINT:  Fever and decreased level of consciousness.  HISTORY OF PRESENT ILLNESS:  [name] is a very pleasant 70 year old female with a complex past medical history who was brought by EMS after being found febrile and complaining of shortness of breath.  The patient had her right knee surgery this  past Tuesday, April 17, 2000.  The patient was prescribed a combination pill of Demerol and Phenergan.  Apparently, the patient was taking extra additional tablets beyond what were recommended.  The patients husband, who right now is not available, reported to the EMS that [name] was lethargic for most of the time after taking these pills; apparently, the patient choked and aspirated.  Currently, the patient is somewhat lethargic and she is not able to provide with history.  PAST MEDICAL HISTORY:  As problem list.  ALLERGIES:  As problem list.  MEDICATIONS:  1. Combination pill of Demerol and Phenergan one tablet four times a day as     needed.  2. Neurontin 600 mg in the morning and in the evening, 900 mg at bedtime.  3. Amaryl 4 mg p.o. q.d.  4. Glucophage XL 500 mg p.o. q.d.  5. Paxil 20 mg p.o. q.d.  6. Insulin 70/30 -- 40 units in the morning, 35 units in the evening.  7. Klonopin 2 mg p.o. t.i.d.  8. Phenergan 25 mg p.o. q.6h.  9. Sonata 10 mg p.o. q.h.s. p.r.n.  FAMILY MEDICAL HISTORY:  The patient was adopted.  SOCIAL HISTORY:  The patient is married.  The patient denies any smoking.  No alcohol use.  She has chronic pain syndrome for which she calls EMS basically almost every day.  She is seen in the emergency department very often.  REVIEW OF SYSTEMS:  As HPI.  The patient denies chest pain.  She denies nausea or vomiting.  No diarrhea.  No abdominal symptoms.  The patient complains of some dysuria.  No hemoptysis.  No hematemesis.  No melena, tarry stools or bright red blood per rectum.  This patient has chronic pain syndrome, mostly on the legs and on the abdomen.  PHYSICAL EXAMINATION:  VITAL SIGNS:  Temperature 101.4; blood pressure 150/70; heart rate 136, currently heart rate 100; respiration rate 24; oxygen saturation 83% on room air, 94% on 2 L nasal cannula.  HEENT:  Normocephalic, atraumatic.  Nonicteric sclerae.  Conjunctivae within the normal  limits.  PERRLA.  EOMI.  Funduscopic exam negative for papilledema or hemorrhages.  TMs within the normal limits.  Dry mucous membranes. Oropharynx clear.  NECK:  Supple.  No JVD, no bruits, no adenopathy, no thyromegaly.  LUNGS:  Scattered rales and rhonchi.  No wheezing.  Some decreased breath sounds at the left base.  CARDIAC:  Slightly tachycardic and no murmurs, rubs or gallops.  ABDOMEN:  Decreased bowel sounds.  Nontender, nondistended.  Bowel sounds were present.  No hepatosplenomegaly.  No rebound, no guarding, no masses.  No bruits.  EXTREMITIES:  The left forearm is missing.  The rest of the extremities show no edema, clubbing or cyanosis.  The pulses are 1+ bilaterally.  NEUROLOGIC:  Decreased level of consciousness; the patient responds to verbal stimuli.  She moves all extremities.  The strength seems to be grossly intact. The sensory is intact.  The reflexes are 2/5 in the lower extremities.  The plantar reflexes are downgoing bilaterally.  LABORATORY AND ACCESSORY DATA:  Chest x-ray consistent with a right upper lobe infiltrate.  Left lower lobe atelectasis.  Hemoglobin 12.1, MCV 88, WBC 12.1, absolute neutrophil count 9.0, platelets 176,000.  Sodium 136, potassium 3.8, chloride 103, CO2 24, BUN 16, creatinine 1.4, glucose 217.  Urinalysis significant for specific gravity greater than 1.040, protein greater than 300, wbcs 11-20; the rest of the lab data pending.  ASSESSMENT AND PLAN:  1. Right upper lobe aspiration pneumonia with septicemia -- the patient     presents with a recent history of knee surgery.  By the patients     familys report and emergency medical service report, it seems like the     patient was taking extra pain killers that induced lethargy.  Apparently,     the patient choked with some fluids.  Given the fever, leukocytosis and     right upper lobe infiltrate, aspiration pneumonia is likely.  At this      point, pulmonary embolus cannot be  ruled out, though it seems to be less     likely.  We will go ahead and admit the patient to a step-down unit.  Two     blood cultures will be obtained.  Sputum will be obtained for Grams stain     and culture.  Also, we will go ahead and start the patient on Unasyn to     cover for aspiration pneumonia; also, bronchodilator treatment around the     clock will be provided along with Humibid. The patient will be monitored     closely.  2. Uncontrolled type 2 diabetes mellitus -- I suspect that the diabetes is     not completely out of control due to the infectious process.  We will go     ahead and start  sliding-scale insulin q.6h.  The patient will be n.p.o.     for now until her lethargy is improved.  At this point, I will not start     D-5 in the intravenous fluids, though if the n.p.o. status is to be     prolonged, then D-5 should be added to the intravenous fluids.  We will     hold the 70/30.  We will start NPH for now and provide with sliding-scale     insulin every six hours, as described above.  3. Dehydration -- the physical exam is consistent with severe dehydration.     The specific gravity is also very high.  We will start intravenous fluids     and follow the fluid balance.  4. Urinary tract infection versus pyelonephritis -- given that the patient     required full general anesthesia with a Foley catheter a few days ago, a     Foley-induced urinary tract infection is possible.  We will get a urine     culture.  Unasyn should cover this urinary tract infection, though we will     follow the urine culture and adjust the medications as needed.  5. Prerenal azotemia -- the patients creatinine, 1.4, is slightly increased     from her baseline, 1.2.  Given the patients prerenal azotemia and     septicemia, we will go ahead and start intravenous fluids and repeat the     renal function in the morning.  I believe that the dehydration and the     septicemia are the most likely  etiologies behind this prerenal azotemia.  6. We will go ahead and start Lovenox for deep venous thrombosis prophylaxis.  Dr. Armstead Peaks from [facility] will take over the care of this patient at time for discharge to her home. Dictated by:   Rosanne Sack, M.D. Attending Physician:  Hinda Glatter DD:  04/20/01 TD:  04/21/01 Job: [id number]

## 2010-06-25 NOTE — Discharge Summary (Signed)
[place]  Patient:    [name], [name] Visit Number: [id number] MRN: [id number]          Service Type: MED Location: [id number] Attending Physician:  Jerl Santos Dictated by:   Jerl Santos, M.D. Admit Date:  04/19/2001 Discharge Date: 04/24/2001   CC:         Arvella Merles, M.D.   Discharge Summary  HISTORY OF PRESENT ILLNESS:  [name] is a 70 year old lady who was initially admitted on March 13 by the hospitalist service after being brought to the emergency room by EMS with a temperature of 101.4 and a three-day history of productive cough.  In the emergency room she was noted on chest x-ray to have increased opacity in the left lower lobe and right upper lobe, both of which might possibly have represented pneumonia or could have represented atelectasis.  HOSPITAL COURSE:  On admission the patient was started on Unasyn 1.5 g IV every six hours with the assumption that this may have represented aspiration pneumonia.  Her blood sugars were carefully monitored and a sliding scale insulin regimen was instituted.  Her course was one of gradual improvement.  A major issue during the course of the hospitalization was disposition.  The patients spouse indicated that he would be unable to care for her at home and requested that a nursing home be arranged.  The patient was absolutely opposed to this idea, and ultimately a decision was made to discharge the patient back to the home setting.  On March 17, the patient was switched to oral antibiotics in the form of Augmentin 500 mg b.i.d. with food.  She tolerated this regimen well.  On March 18 a decision was made to discharge the patient.  DISCHARGE MEDICATIONS:  Discharge medications will consist of the patients usual medicines, i.e., Neurontin 600 mg in the morning and evening and 900 mg at bedtime, Amaryl 4 mg daily, Glucophage XL 500 mg daily, Paxil 20 mg  daily, 70/30 insulin 40 units in the morning and 35 units in the evening, Klonopin 2 mg t.i.d., Phenergan 25 mg p.r.n., Sonata 10 mg at bedtime p.r.n., and a Demerol pill which she takes one to two q.i.d. p.r.n.  In addition, she will be instructed to take Augmentin 500 mg b.i.d. for five additional days.  DISCHARGE DIAGNOSES:  1. Possible aspiration pneumonia.  2. Type 2 diabetes.  3. Dehydration.  4. Chronic pain syndrome.  5. History of cerebral palsy.  6. Essential tremor.  7. Gastroesophageal reflux.  8. Depression and anxiety.  9. History of renal cell cancer. 10. History of basal cell cancer. 11. Status post recent patellectomy per Thereasa Distance A. Chaney Malling, M.D. Dictated by:   Jerl Santos, M.D. Attending Physician:  Jerl Santos DD:  04/24/01 TD:  04/24/01 Job: [id number]

## 2010-06-25 NOTE — H&P (Signed)
[name], [name]                      ACCOUNT NO.:  [id number]   MEDICAL RECORD NO.:  [id number]                   PATIENT TYPE:  INP   LOCATION:  0442                                 FACILITY:  [facility]   PHYSICIAN:  Corwin Levins, M.D. [facility]             DATE OF BIRTH:  04/05/1940   DATE OF ADMISSION:  09/18/2001  DATE OF DISCHARGE:                                HISTORY & PHYSICAL   CHIEF COMPLAINT:  Decreased ambulatory ability, failure to thrive at home,  family no longer able to care for her, and questionable recurrent urinary  tract infection again.   HISTORY OF PRESENT ILLNESS:  [name] is a 70 year old white female,  presented with the above for admission to the nursing home placement.  Her  only complaint is the chronic Foley catheter smells, she thinks it needs  changed.  The current one has been in since 07/16, with some increased pain  with urination and occasional leaks around the Foley.  She is using  __________ for early buttocks sores.  She sees urology at [facility], and was  supposed to have some sort of bladder surgery next month.  No fever, flank  pain, nausea, vomiting, or abdominal pain.   PAST MEDICAL HISTORY:  Illnesses:  1. March, 2003 aspirate pneumonia/sepsis.  2. Diabetes mellitus.  3. Recurrent UTI.  4. Chronic pain syndrome with multiple ER visits.  5. Mild cerebral palsy.  6. Essential tremor status post brain surgery at [facility] in 1971, with chronic     right-sided spasticity.  7. Status post left forearm amputation after injury.  8. History of chronic neuritis.  9. GERD.  10.      Anxiety/depression.  11.      History of renal cell cancer, status post left nephrectomy in 2000.  12.      March, 2003 right knee surgery/knee DJD.   ALLERGIES:  Sulfa and morphine.   CURRENT MEDICATIONS:  Glucophage-XR 500 mg p.o. q.d., Neurontin 300 mg p.o.  t.i.d., Demerol/Promethazine 1-2 p.o. q.3-4h. p.r.n., Sonata 10 mg q.h.s.  p.r.n., Oxybutynin 5 mg  q.8h., Promethazine 25 mg q.8h., Allegra 180 mg p.o.  q.d., Klonopin 1 mg 1 b.i.d. and 2 q.h.s., Vicodin 5/500 1 q.4h. p.r.n.,  Effexor-XR 75 mg p.o. q.d., Nystatin cream and Lomotil p.r.n.,  phenazopyridine 200 mg t.i.d., Amaryl 4 mg 2 q.d.   SOCIAL HISTORY:  Married, no tobacco, no alcohol.   FAMILY HISTORY:  Adopted.   PHYSICAL EXAMINATION:  GENERAL:  [name] is a 70 year old white female, pleasant, alert.   VITAL SIGNS:  Afebrile, blood pressure 150/70, respirations 16, heart rate  98.   HEENT:  Normocephalic, atraumatic, pharynx benign.   NECK:  Neck without lymphadenopathy, JVD, or thyromegaly.   CHEST:  No rales or wheezing.   CARDIAC:  Regular rate and rhythm.   ABDOMEN:  Soft, nontender, positive bowel sounds.   EXTREMITIES:  No edema.  She is  status post right knee surgery.  There is  some right-sided spasticity involved.   NEUROLOGIC:  Neurological exam not done in detail.   LABORATORY DATA:  The only labs back at the point of dictation is UA with 11-  20 white blood cells, 21-50 red blood cells.  CBC and CMP pending.  Urine  culture obtained, results pending.   ASSESSMENT AND PLAN:  1. Decreased mobility with lower extremity weakness, status post right knee     surgery and chronic right-sided spasticity, with failure to thrive.  She     is to be admitted, start physical therapy to try to mobilize, and follow     up labs, including CBC and CMP.  2. Abnormal UA with chronic Foley.  The Foley needs to be replaced.  Will     follow culture.  If no other evidence of significant infection at this     time, hold further antibiotics.  Consider local urology consult.  She may     need bladder surgery versus suprapubic catheter.  3. Buttock erythema.  Skin care consult to be order.  Will continue the     __________, continue the Foley catheter.  4. Diabetes mellitus.  Will check hemoglobin A1c, continue medication as it     is, check CBGs and sliding scale  insulin.  5. Multiple other medical problems as above.  Continue all other medications     as at home.  6. This admission, Social Services to help with nursing home placement.  7. Full code.                                               Corwin Levins, M.D. [facility]    JWJ/MEDQ  D:  09/18/2001  T:  09/21/2001  Job:  [id number]

## 2010-06-25 NOTE — Op Note (Signed)
NAMEBILLEE, [name]            ACCOUNT NO.:  [id number]   MEDICAL RECORD NO.:  [id number]          PATIENT TYPE:  AMB   LOCATION:  ENDO                         FACILITY:  [facility]   PHYSICIAN:  John C. Madilyn Fireman, M.D.    DATE OF BIRTH:  January 03, 1941   DATE OF PROCEDURE:  01/25/2006  DATE OF DISCHARGE:                               OPERATIVE REPORT   PROCEDURE:  Esophagogastroduodenoscopy with esophageal dilatation.   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   PROCEDURE:  The patient was placed in the left lateral decubitus  position and placed on the pulse monitor with continuous low-flow oxygen  delivered by nasal cannula.  She was sedated with 50 mcg IV fentanyl and  3 mg IV Versed.  The Olympus video endoscope was advanced under direct  vision into the oropharynx and esophagus.  The esophagus was straight  and of normal caliber with the squamocolumnar line at 38 cm.  There was  no visible ring, stricture, hiatal hernia or other abnormality of the GE  junction or distal esophagus.  The stomach was entered and a small  amount of liquid secretions was suctioned from the fundus.  Retroflexed  view of the cardia was unremarkable.  The fundus, body, antrum and  pylorus all appeared normal.  The duodenum was entered and both bulb and  second portion were well-inspected and appeared be within normal limits  as well.  Due to the complaints of dysphagia, a Savary dilator was  placed through the endoscope channel and the scope withdrawn.  A single  17 mm Savary dilator was passed over the guidewire with minimal  resistance and no blood seen on withdrawal.  The dilator was removed  together with the wire and the patient returned to the recovery room in  stable condition.  She tolerated the procedure well, and there were no  immediate complications.   IMPRESSION:   Normal study, status post empiric dilatation to 17 mm.   PLAN:  Advance diet and observe response to dilatation.           ______________________________  Everardo All Madilyn Fireman, M.D.     JCH/MEDQ  D:  01/25/2006  T:  01/25/2006  Job:  [id number]   cc:   Holley Bouche, M.D.

## 2010-06-25 NOTE — Op Note (Signed)
   NAME:  FLYNN, LININGER                      ACCOUNT NO.:  [id number]   MEDICAL RECORD NO.:  [id number]                   PATIENT TYPE:  EMS   LOCATION:  ED                                   FACILITY:  [facility]   PHYSICIAN:  Rodney A. Chaney Malling, M.D.           DATE OF BIRTH:  [birth year]   DATE OF PROCEDURE:  06/27/2002  DATE OF DISCHARGE:  06/25/2002                                 OPERATIVE REPORT   PREOPERATIVE DIAGNOSIS:  Exostosis, left proximal tibia in the area of  previous left knee fusion.   POSTOPERATIVE DIAGNOSIS:  Exostosis, left proximal tibia in the area of  previous left knee fusion.   OPERATION PERFORMED:  Excision of exostosis, lateral aspect of left proximal  tibia in the fusion site of knee.   SURGEON:  Lenard Galloway. Chaney Malling, M.D.   ANESTHESIA:  MAC.   DESCRIPTION OF PROCEDURE:  The patient was placed on the operating table in  supine position with a pneumatic tourniquet about the left upper thigh.  The  area of incision was prepped with alcohol and injected with Marcaine.  The  leg was then prepped with DuraPrep and draped out in the usual manner.  The  leg was wrapped out with an Esmarch.  Tourniquet was elevated.  Incision  made proximal to the very large exostosis which was breaking through the  skin.  Incision was started one inch proximal to the exostosis and carried  one inch distal to the exostosis.  Skin edges were retracted.  Bleeders were  coagulated. The dissection was carried down to the tibia.  The base of the  exostosis could clearly be seen and this was amputated using an osteotome.  The margins were then debrided with a rongeur.  The raw bleeding bone was  then covered with bone wax and excess bone wax was removed.  2-0 Vicryl was  used to close the subcutaneous tissue and the skin was closed with stainless  steel staples.  Sterile dressings were applied.  The patient was then  transferred to the recovery room in excellent condition.   Technically, this  procedure went extremely well.   DRAINS:  None.   COMPLICATIONS:  None.                                               Rodney A. Chaney Malling, M.D.    RAM/MEDQ  D:  06/27/2002  T:  06/27/2002  Job:  [id number]

## 2010-06-25 NOTE — H&P (Signed)
NAME:  [name], [name]                      ACCOUNT NO.:  [id number]   MEDICAL RECORD NO.:  [id number]                   PATIENT TYPE:  INP   LOCATION:  0380                                 [facility]ACILITY:  [facility]   PHYSICIAN:  Candyce Churn, M.D.          DATE OF BIRTH:  August 12, 1940   DATE OF ADMISSION:  05/19/2002  DATE OF DISCHARGE:                                HISTORY & PHYSICAL   CHIEF COMPLAINT:  Fever and left abdominal pain.   HISTORY OF PRESENT ILLNESS:  The patient is a 69 year old unfortunate female  with a history of multiple medical issues.  (1) Recurrent urinary tract  infections with history of Foley used in the past, (2) type 2 diabetes  mellitus, (3) multiple ER visits for minor complaints, (4) cerebral palsy  with right-sided spasticity, (5) chronic constipation, (6) status post left  forearm amputation status post injury, (7) peripheral neuropathy, (8) GERD,  (9) anxiety/depression, (10) history of basal cell carcinoma, (11) history  of left nephrectomy secondary to renal cell carcinoma in 2000, (12) right  knee surgery in March 2003, (13) recent buttock rash - question of shingles,  now on Valtrex, (14) history of aspiration pneumonia and sepsis in March  2003, (15) allergic rhinitis.   The patient presents complaining of fever to 102.9 today.  She has had left  lateral abdomen/flank pain with left lower quadrant pain to palpation on  exam in the emergency room.  The patient had a bowel movement yesterday but  still feels constipated.  She has not had any diarrhea.  Denies any melena  or bright red blood per rectum.  She does not feel any real significant pain  until palpated or percussed in the left abdomen.  Denies dysuria and denies  melena and bright red blood per rectum as mentioned.  Urinalysis is not  convincing for urinary tract infection.  Pain location consistent with  diverticulitis.  We will admit for further workup and IV therapy.   MEDICATIONS:  1. Neurontin 300 mg p.o. three times daily.  2. Metformin 500 mg daily.  3. Klonopin 1 mg three times daily.  4. Ditropan 5 mg b.i.d.  5. Paxil 40 mg daily.  6. Ambien 10 mg p.o. at bedtime.  7. Amaryl 8 mg p.o. q.a.m.  8. Novolog Insulin as needed - usually given for sugars over 200.   Other medications started this week include Atarax 25 mg three times daily  p.r.n., Valtrex 1 g b.i.d. for 10 days, and Pen-Vee K q.i.d. x7 days.  This  apparently was given for a rash on her buttocks.   PAST SURGICAL HISTORY:  1. Total abdominal hysterectomy.  2. Left nephrectomy.  3. Knee surgery.  4. Left arm amputation as above.   ALLERGIES:  SULFA, MORPHINE SULFATE, and CODEINE.   SOCIAL HISTORY:  No tobacco use or ethanol use.  She is married.  She has a  supportive husband.  She is disabled.  FAMILY HISTORY:  She is adopted.   REVIEW OF SYSTEMS:  The patient did complain of a cough earlier to the nurse  but not to me.  She spoken of cough with gray phlegm but she denies that on  my history.  Denies any focal severe pain.  She does feel as if she is  constipated.   PHYSICAL EXAMINATION:  GENERAL:  Pale-appearing female who is drowsy but  arousable to high level of alertness.  She has been complaining of some  sweats.  VITAL SIGNS:  Temperature of 102.9 on arrival to the ER and now 99 degrees  after Tylenol dose.  Pulse was 114 now down to 80 and regular.  Respiratory  rate 20.  Blood pressure 124/78.  HEENT:  Atraumatic, normocephalic.  Oropharynx is dry.  NECK:  Supple without JVD.  CHEST:  Clear to auscultation.  CARDIAC:  Regular rhythm without murmur, rub, or gallop.  ABDOMEN:  Soft but tender in the left lateral abdomen and left lower  quadrant.  No rebound.  Midline suprapubic scar present and well healed.  BUTTOCKS:  Without rashes.  EXTREMITIES:  Left upper extremity with amputation at level of distal  arm/hand.  Left knee with surgical scar.  No significant  edema.  NEUROLOGIC:  She is oriented x3.  Nonfocal except for spasticity on the  right.   LABORATORY DATA:  Chest x-ray no active disease.  KUB reveals mild  obstipation but not severe, normal gas pattern.  Abdominal CT is pending.  White count 10,000, hemoglobin 12.8, platelet count 188,000.  Sodium 136,  potassium 2.6, chloride 106, bicarb 24, BUN 10, creatinine 1.1, blood sugar  230. Urinalysis is cloudy, glucose is 500, hemoglobin is large, leukocyte  esterase and nitrite are negative, protein is negative, 21-50 red cells, 3-6  white cells.   ASSESSMENT:  The patient is a 70 year old female with fever and left  abdominal pain and microscopic hematuria.  Diverticulitis is very possible  here.  Doubt fever secondary to obstipation only.  __________  possible but  not from the left.  She is status post left nephrectomy.   PLAN:  1. Start by checking abdominal CT and treat with IV Cipro and Flagyl.  2. Type 2 diabetes mellitus treat with sliding scale Regular Insulin and     Amaryl and hold Glucophage for now.  3. Depression - continue psych meds.  The patient seems to be doing     relatively well.  4.     Urinary urgency - question need for Ditropan.  This could be causing     constipation.  5. She is a Full Code.  6. Repeat urinalysis on 05/20/2002.                                               Candyce Churn, M.D.    RNG/MEDQ  D:  05/19/2002  T:  05/19/2002  Job:  [id number]   cc:   Holley Bouche, M.D.  [address]  [place], [place] [address]  Fax: [phone]   C. Lesia Sago, M.D.  [address]  [address]  [place]  [place] [address]  Fax: [phone]

## 2010-06-25 NOTE — H&P (Signed)
[facility]  Patient:    [name], [name]                     MRN: [id number] Adm. Date:  03/29/00 Attending:  Arvella Merles, M.D.                         History and Physical  DATE OF BIRTH:  [birth year]  CHIEF COMPLAINT:  "Cant walk."  HISTORY OF PRESENT ILLNESS:  The patient is a 70 year old female with history of diabetic neuropathy who presents with increasing weakness in the right lower extremity for the past 2 to 3 weeks but especially worse for the 7 to 10 days prior to admission with increased falls occurring up to 5 times per day. She is unable to get out of bed or to go the restroom without falling. The patient has a very complicated past medical history as outlined below. Over the past 4 weeks, has had increased right lower extremity spasms, especially in the thigh, possibly onset after a fall. She has had a long history of diabetic neuropathy with a gait disorder which has been followed by her neurologist C. Lesia Sago, M.D. She has always had occasional falls but can usually easily get up with some assistance by family members. Family points at this point she is unable to assist them at all and is essentially dead weight when she falls (husband, Vonna Kotyk, phone number [phone]; power of attorney Henrine Screws, phone number [phone]). The patient has been enrolled in the [facility], and her case worker Oliver Pila (phone number [phone]) has become concerned that the patient is unable to be cared for at home now; and in fact, is becoming a danger to herself because of the frequent falls. She calls EMS frequently secondary to the falls sometimes as many as three or four times a day. Two days prior to admission, she twisted her left ankle when she fell and apparently fractured her left fifth metatarsal. Home physical therapy via [facility] has been evaluating the patient for past couple of weeks and  have not made much success so far. The patient has been in a general decline over the past several months with gradually elevated CBGs and poor dietary habits. She admits to shortness of breath with exertion but denies any chest pain. No fevers, sweats, or cough. Occasional edema. She has history of urinary incontinence and this continues. The patient has been a frequent utilizer of both the emergency room and EMS services.  PAST MEDICAL HISTORY:  1. The patient was born with a benign essential tremor, and there is a     question of some mild cerebral palsy from birth.  2. In 1971, the patient had neurosurgery on the right side of her head at     [facility] in an attempt to decrease the tremor. Unfortunately, she was left     with severe left-sided spasticity, especially in the left upper extremity.  3. 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 Has had phantom pain since that time.  4. Status post left heel cord release secondary to spasticity.  5. Type 2 diabetes. Previously well controlled when she was following her     diet but as above more recently more difficult to control. Hemoglobin A1C     was 8.1 on February 25, 2000.  History of diabetic neuropathy on nerve     conduction velocity/EMG study September 1999.  6. Status post left knee fusion secondary to chronic pain after a fractured     patella.  7. Status post hysterectomy.  8. Renal cell carcinoma. Status post left nephrectomy in July 2000. No     evidence of recurrence on a follow-up CT scans since then.  9. Status post recent basal cell skin cancers x 2 on the back, one on the     right foot removed July 2001. 10. 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 11. Parotitis in September 2001 with question of Sjogrens syndrome. She has     been evaluated by Zola Button T. Lazarus Salines, M.D. from an ENT standpoint and     Julieanne Cotton, M.D. has begun a workup but she has  not yet had a lip     biopsy. 12. 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 13. Spastic neurogenic bladder with incontinence. 14. Fractured right humerus December 04, 1999.  CONSULTATIONS:  1. Neurology: Marlan Palau, M.D.  2. Orthopedics:  Lenard Galloway. Chaney Malling, M.D.  3. Urology:  Veverly Fells. Vernie Ammons, M.D.  4. ENT:  Gloris Manchester. Lazarus Salines, M.D.  5. Rheumatology:  Julieanne Cotton, M.D.  CURRENT MEDICATIONS:  1. Neurontin 300 mg one or two in the morning, one or two in the evening, two     or three q.h.s.  2. Amaryl 4 mg two q.d.  3. Glucophage XR 500 mg q.d.  4. Paxil 20 mg q.d.  5. Novolin 70/30 insulin (via Novopen) 40 units in the morning, 35 units in     the evening.  6. Amitriptyline 50 mg q.h.s.  7. Klonopin 2 mg t.i.d.  8. P.r.n. Phenergan 25 mg q.6h. p.r.n. nausea  9. P.r.n. Sonata 10 mg q.h.s. p.r.n. sleep. 10. P.r.n. Vioxx 25 mg q.d. p.r.n. pain.  ALLERGIES:  SULFA, intolerant of MORPHINE.  FAMILY HISTORY:  Unknown. The patient is adopted.  SOCIAL HISTORY:  She is married. No tobacco or alcohol. As above, poor dietary compliance. Drinks frequent sodas, ice cream, eats cake, etc.  REVIEW OF SYSTEMS:  As per HPI. Does have periodic nausea. History of GERD but stable now. Had recent right knee pain status post some of the recent falls. She is frequently somnolent through the day with possible polypharmacy contributing to this.  PHYSICAL EXAMINATION:  GENERAL:  Well-developed, but overweight, in wheelchair. No acute distress.  VITAL SIGNS:  Blood pressure 130/80, pulse 80.  HEENT:  Normocephalic, atraumatic. Pupils are equal, round, and reactive to light. Fundi normal. Mouth and pharynx normal. TMs normal.  NECK:  Supple, nontender, without any adenopathy or asymmetry.  LUNGS:  Clear.  HEART:  Regular rate and rhythm without murmur, gallop, or rub.  ABDOMEN:  Positive bowel sounds. Soft, nontender. No mass.  No hepatosplenomegaly.  EXTREMITIES:  There is no edema. Left arm is status post amputation at the elbow. Left knee is status post fusion. Left ankle with ASO in place. On the  right thigh, there is no tenderness to palpation. No erythema or swelling but right thigh hurts with muscle strength testing of the right lower extremity. Muscle strength is 4/5 in the right lower extremity compared to the left. Unclear if this is giveaway pain and she is unable to bear weight. Normal pulses. DTRs 1+ in the right lower extremity, 1+ at left lower extremity ankle jerk, 1+ in the right upper extremity.  GENITOURINARY:  Deferred.  LABORATORY  DATA:  Pending.  IMPRESSION:  1. Worsening lower extremity weakness especially on the right with the     patient now unable to walk and frequent falls secondary to this. She has     now become essentially a danger to herself at home. This may be largely     secondary to deconditioning with recent muscle injury exacerbated by the     underlying diabetic neuropathy. ______ underlying spinal stenosis. Doubt     CVA but need to rule this out as well.  2. Type 2 diabetes. Fair control on recent hemoglobin A1C but requiring     insulin and two drug oral therapy which as above was probably largely     secondary to dietary noncompliance.  3. Numerous other chronic medical problems as outlined above.  PLAN:  1. Admit.  2. Will consult Marlan Palau, M.D. and Lenard Galloway. Chaney Malling, M.D. for     evaluation. May need MRI of the lumbosacral spine and possibly of the MRI     of the brain.  3. Physical therapy evaluation.  4. Discharge planning as patient will probably need nursing home or rehab     placement initially.  5. Continue the usual medications, but we will cut back where possible to try     to decrease the polypharmacy and her frequent somnolence.  6. Check CMET and CBC tonight to rule out underlying metabolic disorder. DD:  03/29/00 TD:  03/30/00 Job:  [id number]

## 2010-06-25 NOTE — Discharge Summary (Signed)
NAMEKAHLEN, BOYDE                      ACCOUNT NO.:  [id number]   MEDICAL RECORD NO.:  [id number]                   PATIENT TYPE:  INP   LOCATION:  0442                                 FACILITY:  [facility]   PHYSICIAN:  Deirdre Peer. Polite, M.D.              DATE OF BIRTH:  09-26-1940   DATE OF ADMISSION:  09/18/2001  DATE OF DISCHARGE:  09/21/2001                                 DISCHARGE SUMMARY   DISCHARGE DIAGNOSES:  1. Decreased mobility status with lower extremity weakness.  2. Abnormal urinalysis with chronic Foley.  3. Non-insulin-dependent diabetes.  4. Chronic pain syndrome with multiple emergency room visits.  5. Mild cerebral palsy.  6. Essential tremor, status post brain surgery at [facility] in 1971 with right-     sided spasticity.  7. Status post left forearm amputation after injury.  8. Chronic neuritis.  9. Gastroesophageal reflux disease.  10.      Anxiety/depression.  11.      History of renal cell carcinoma, status post a left nephrectomy in     2000.  12.      March of 2003, right knee surgery for degenerative joint disease.  13.      March of 2003, aspiration pneumonia/sepsis.   DISCHARGE MEDICATIONS:  1. Glucophage XR 500 mg daily before breakfast.  2. Neurontin 300 mg three times a day.  3. Ditropan 5 mg q.8h.  4. Claritin 10 mg daily.  5. Klonopin 1 mg b.i.d.  6. Klonopin 2 mg at bedtime.  7. Effexor XR 75 mg daily.  8. Pyridium 200 mg three times a day.  9. Amaryl 4 mg two times a day.  10.      Phenergan 25 mg q.4h. p.r.n.  11.      Vicodin 5/500 mg one q.4h. p.r.n. for pain.  12.      Tylenol 650 mg q.4h. p.r.n. for mild pain or fever.   CONSULTANTS:  None.   PROCEDURES:  None.   DISPOSITION:  The patient is to be discharged to [facility]  [place].   HISTORY OF PRESENT ILLNESS:  This is a 70 year old female who has mild  cerebral palsy, a right-sided spasticity, status post surgery.  She was  previously living at home with  her family.  The patient had a decrease in  her ambulatory status with failure to thrive and questionable recurrent  UTIs.  She presented to [facility] ER on September 18, 2001 with the above  issues and possible long-term care placement due to the family's inability  to care for the patient at home.  She was admitted.  PT was consulted for  her lower extremity weakness.  The patient does have a chronic Foley, which  was changed.  UA was done which was nitrite-positive with many bacteria.  The patient had no signs or symptoms of acute infection.  All other chronic  medical problems were stable throughout  her hospitalization.   DISCHARGE INSTRUCTIONS:  PT and OT are to evaluate and treat.   DIET:  She can have a regular diet with no concentrated sweets.   WOUND CARE:  Skin care consult for her buttocks.   FOLLOWUP:  The patient is to be followed up by facility M.D.     Stephanie G. [place], N.P.                 Deirdre Peer. Polite, M.D.    SGJ/MEDQ  D:  09/21/2001  T:  09/21/2001  Job:  [id number]

## 2010-06-25 NOTE — Consult Note (Signed)
NAME:  [name], [name]                      ACCOUNT NO.:  [id number]   MEDICAL RECORD NO.:  [id number]                   PATIENT TYPE:  OBV   LOCATION:  0451                                 FACILITY[facility]   PHYSICIAN:  Lorre Munroe., M.D.            DATE OF BIRTH:  Jun 28, 1940   DATE OF CONSULTATION:  03/26/2003  DATE OF DISCHARGE:                                   CONSULTATION   CHIEF COMPLAINT:  Painful right great toe.   PRESENT ILLNESS:  This is a 70 year old white female with cerebral palsy,  diabetes, and multiple other problems, who has had a red and painful right  great toe for a while.  She is not sure quite how long.  She has been  admitted to the hospital, and I am asked to see her about the toe.  She has  not had any fever or chills.  A white count done on the date of admission is  normal.   PAST MEDICAL HISTORY:  In addition to the above, she has had a forearm  amputation and multiple orthopedic procedures.  She has neuropathy, anxiety,  and depression.  She was a left nephrectomy for renal cell cancer.  The  patient is apparently cured.  There is a history of left lower quadrant  pain, possibly diverticulitis.  She has no history of any peripheral  vascular disease.  She has had a basal cell carcinoma on the sole of the  right foot.  This is not completely healed.   MEDICATIONS:  1. Neurontin.  2. Metformin.  3. Klonopin.  4. Ditropan.  5. Paxil.  6. Ambien.  7. Amaryl.  8. NovoLog insulin.   ALLERGIES:  1. SULFA.  2. MORPHINE.  3. CODEINE.  (Not allergic to local anesthetics.  See the previous records for further  details.)   PHYSICAL EXAMINATION:  GENERAL:  The patient is cheerful and in no acute  distress.  VITAL SIGNS:  Unremarkable as recorded by the nurse.  EXTREMITIES:  The left arm is amputated below the elbow.  The lower  extremities show some deformity.  The right great toe is swollen, red near  the tip, tender to touch.  There is an  obvious ingrown toenail.  There has  been a small incision made for relief of pus.  Pulse is present.  Capillary  filling is good.   IMPRESSION:  Paronychia due to ingrown toenail, right great toe.   PLAN:  Under digital block, using 1% lidocaine, I avulsed the nail plate.  Hemostasis was not a problem.  I bandaged the wound with Neosporin and a  bulky bandage.  I will follow the patient along and made sure healing takes  place well.  I did not, as well, that there is a small unhealed area in the  in-step of the plantar surface of the right foot, which could be recurrent  basal cell, although it is not totally typical  for that.  This should be  followed closely.                                               Lorre Munroe., M.D.    Jodi Marble  D:  03/26/2003  T:  03/27/2003  Job:  [id number]

## 2010-06-25 NOTE — Discharge Summary (Signed)
NAME:  KATORA, FINI                      ACCOUNT NO.:  [id number]   MEDICAL RECORD NO.:  [id number]                   PATIENT TYPE:  INP   LOCATION:  0344                                 FACILITY:  [facility]   PHYSICIAN:  Isla Pence, M.D.             DATE OF BIRTH:  May 31, 1940   DATE OF ADMISSION:  06/18/2003  DATE OF DISCHARGE:  06/24/2003                           DISCHARGE SUMMARY - REFERRING   DISCHARGE DIAGNOSES:  1. Urinary tract infection with Providencia stuartii that was only sensitive     to ceftriaxone.  2. Diabetes mellitus.  3. History of recurrent urinary tract infections.  4. History of cerebral palsy with right-sided spasticity.  5. History of chronic constipation.  6. History of left forearm amputation, status post injury.  7. History of chronic peripheral neuropathy.  8. History of gastroesophageal reflux disease.  9. History of chronic anxiety and depression.  10.      History of basal cell carcinoma.  11.      History of left nephrectomy, secondary to renal cell carcinoma in     2000.  12.      Status post right knee surgery in March 2003, for which she sees     Dr. Thereasa Distance A. Mortenson.  13.      History of paronychia.  14.      History of shingles.  15.      History of aspiration pneumonia.  16.      History of allergic rhinitis.  17.      Hypertension during this hospital stay.   DISCHARGE MEDICATIONS:  1. Ceftriaxone 1 g IV daily for another three more days.  Her last dose will     be on Jun 27, 2003.  This is to be done through home health.  2. Vicodin 5/500 mg, one tab p.o. q.4-6h. p.r.n. pain.  She will be given     #40 tablets.  3. Lantus, which is a new medication for her, 7 units subcutaneously q.h.s.     Her husband will give this to her.  4. Amaryl 4 mg p.o. b.i.d. with meals morning and evening as at home.  5. Glucophage 500 mg p.o. q.a.m.  6. Ditropan 5 mg p.o. q.8h.  7. Paxil 40 mg p.o. daily.  8. Altace 2.5 mg p.o. daily.  9.  Neurontin 300 mg p.o. t.i.d.  10.      The patient is to continue home Vistaril.  11.      Protonix 40 mg p.o. daily.  12.      The patient is to continue her sliding scale insulin as at home.     Her husband checks her sugars two times daily, but he has been advised     not to give any sliding scale coverage for any sugars of less than 150,     since the Lantus is being started on her.   ACTIVITY:  As previously, where she  has actually been wheelchair-bound.   DIET:  Recommend a low-fat, 1500 calorie to 1800 calorie ADA diet.   SPECIAL INSTRUCTIONS:  The patient is to continue checking her sugars b.i.d.  with sliding scale coverage as they have been doing; however, not to give  any sliding scale coverage for sugars less than 150.   FOLLOWUP:  1. To follow up with Dr. Holley Bouche in one week.  At the time that Dr.     Tiburcio Pea sees her, she will need to get a basic metabolic panel checked, to     make sure that the Altace is not having any effect on her renal function.  2. To follow up with her urologist, whom I understand from what the patient     tells me, follows her for her renal cell carcinoma.   HISTORY OF PRESENT ILLNESS:  This patient was primarily admitted to the  [facility], secondary to issues with care at home.  She  apparently calls EMS at least twice a day, whenever she gets concerned  whether her sugars may be going up or down.  She was brought into the  emergency room on Jun 18, 2003, with complaints of urinary frequency, and  was found to have a urinary tract infection.   HOSPITAL COURSE:  She was initially started on Cipro 400 mg p.o. b.i.d.;  however, this was subsequently switched to ceftriaxone, once the urine  culture came back showing Phoenicia stuartii which is only sensitive to  ceftriaxone.  It was resistant to the __________ and various other  medications, including Kefzol and cephalexin.  The patient is to complete  ceftriaxone at  home.  In regards to issues with her coming to the emergency room fairly  frequently, and also calling EMS, it was concerning enough that the EMS had  a conference with the doctors in the emergency room about whether this  patient should be placed in a nursing home setting.  It was the intention of  [place] to work on this; however, the patient has adamantly refused to go into  the nursing home setting, and she is highly competent in making this  decision.  She had asked about home health nursing arrangements, and I felt  that maybe a trial of that would be adequate, in seeing how she does, and  hopefully we can minimize the number of times that she calls EMS to come see  her at home, or when she drives into the emergency room.  She apparently has  made it to the emergency room over 100 times over whatever period of time,  is what I was told.  If this fails, then Dr. Holley Bouche can certainly  talk to the patient about nursing home placement.  In regards to her diabetes mellitus, her hemoglobin A1c during this stay was  7.7, which suggests a slight room for improvement.  Therefore Lantus was  added to her regimen, and hopefully this will minimize the times that her  husband needs to give her sliding scale coverage.  Her sugars during her  stay here have run anywhere from 226 to 131, and since the Lantus was  started, trending slowly down.  She will continue on her Amaryl b.i.d. and  her Glucophage.  During the hospital stay she was also noted to be slightly hypertensive and  persistently hypertensive.  Therefore, in light of her diabetes mellitus,  the patient was started on Altace at 2.5 mg daily, which can be monitored by  Dr. Tiburcio Pea.  One day prior to discharge her BMP showed a creatinine of 1.2, sodium 135,  potassium 4, chloride and CO2 are 105 and 26, glucose 186, calcium 9.3, BUN  20.  The patient was continued on all of her other medications that she normally  receives at home. On  the day of discharge she complained of some indigestion, heartburn-type  of symptoms.  Therefore she will be given a prescription for Protonix.  I do  not see a PPI on her list at the time of her admission, as part of her home  regimen, although there is a history listed on her past medical history for  gastroesophageal reflux disease.  Therefore she will stay on the Protonix.  Her CBC at the time of admission was normal, with a white count of 6.8, H&H  normal at 13.6 and 40.6 respectively, platelet count 221,000 at the time of  discharge.  Two days prior to discharge her white count was still normal,  with a total white count of 6.3, H&H 12.5 and 37.6 respectively, platelet  count 208,000.  Once gain, home health has been arranged.  They will be coming out and  seeing her at least once a day.  Will ensure that the husband is doing the  sugar checks appropriately.  They will also administer her ceftriaxone dose,  and hopefully this will keep her out of the emergency room, and prevent her  from making frequent calls to the EMS.                                                Isla Pence, M.D.    RRV/MEDQ  D:  06/24/2003  T:  06/24/2003  Job:  [id number]   cc:   Holley Bouche, M.D.  [address]  [place], [place] [address]  Fax: [phone]

## 2010-06-25 NOTE — Discharge Summary (Signed)
[name], [name]                      ACCOUNT NO.:  [id number]   MEDICAL RECORD NO.:  [id number]                   PATIENT TYPE:  INP   LOCATION:  0451                                 FACILITY:  [facility]   PHYSICIAN:  Melissa L. Ladona Ridgel, MD               DATE OF BIRTH:  19-Sep-1940   DATE OF ADMISSION:  03/26/2003  DATE OF DISCHARGE:  03/28/2003                                 DISCHARGE SUMMARY   ADMISSION DIAGNOSES:  1. Fever.  2. Cough.   DISCHARGE DIAGNOSES:  1. Paronychia with subonchial infection, status post right great toenail     removal.  2. Fever.  Remains low-grade at the time of discharge.  3. Cough, resolved.  4. Diabetes.  Medications being titrated.   HISTORY OF PRESENT ILLNESS:  The patient is a 70 year old white female with  a past medical history for cerebral palsy, diabetes, benign tremor,  suboptimal vision, who remains wheelchair bound secondary to her cerebral  palsy.  She recently has had frequent visits to the emergency room, and on  this admission is found to be slightly febrile with a right great toe  infection.  She is therefore admitted for surgical debridement of the toe,  as well as antibiotic therapy.  Her hospital course consisted of I&D of her  right great toe with cultures and surgical removal of her right great  toenail.  She was treated with topical therapy, Neosporin, and dressing  changes, as well as IV Cipro while awaiting cultures.  She initially was to  be started on Keflex, however, this was held secondary to nursing's concern  for penicillin allergy despite no recent description of cephalosporin cross  reactivity.  She did well for the first 24 hour period and defervesced.  On  the day of discharge she did, however, have a low-grade temperature and  despite this requested discharge to home with home nursing.  Her vitals on  discharge were a temperature of 100.2, respirations of 20, blood pressure  was 155/83, and her pulse at rest  was 80, with activity was 101.  Her  glucose has remained poorly controlled during this hospitalization, and she  therefore was increased on her Metformin dose at the time of discharge.  Despite low-grade temperature, the patient requested discharged to home with  followup with home nursing.  She showed no signs or symptoms of pneumonia on  chest x-ray.  Urinary tract infection.  She was continued on her  nitrofurantoin 20 prophylaxis for frequent UTIs.  Post discharge followup of  her cultures revealed methicillin-resistant Staphylococcus aureus.  This was  conveyed to her primary care physician and a call was placed to her call  nursing caregivers.  The analysis of her right great toe recovery by nursing  revealed that the toe appeared improved, there was less paronychia, no  evidence for pus, just a minor erythema at the base where the nail was  removed.  It  was therefore decided to continue her on Cipro only to cover  both her pulmonary initial complaints and just proceed with topical care of  the right great toe bed since there was no positive cultures in the blood  for indicating any systemic infection.  Therefore, at the time of discharge  her medications are as follows:   DISCHARGE MEDICATIONS:  1. Ciprofloxacin 250 mg p.o. b.i.d. to cover minor upper respiratory     symptoms possibly consistent with bronchitis.  2. Clonazepam 1 mg p.o. t.i.d.  3. Triamcinolone cream to her rash on her back t.i.d.  4. Ditropan 5 mg q.8h.  5. Metformin ER 500 mg daily was increased to 1000 mg daily.  6. Gabapentin 300 mg p.o. t.i.d.  7. Amaryl 4 mg two tablets p.o. daily.  8. Paxil 40 mg p.o. daily.  9. Hydroxyzine 25 mg one to two tablets q.6h. p.r.n. for itching.  10.      Nitrofurantoin one tablet q.h.s. x3 months for urinary tract     infection prophylaxis.   Of note, in followup on the day of discharge the patient's home health nurse  indicated she remained afebrile at home, displayed no  signs or symptoms of  upper respiratory infection, and her dressing changes were being carried out  b.i.d. with dry dressing and topical bacitracin.   At the time of discharge and in followup, the patient remains stable.                                               Melissa L. Ladona Ridgel, MD    MLT/MEDQ  D:  04/07/2003  T:  04/07/2003  Job:  [id number]   cc:   Holley Bouche, M.D.  [address]  [place], [place] [address]  Fax: [phone]

## 2010-11-01 LAB — COMPREHENSIVE METABOLIC PANEL
ALT: 19
Albumin: 3.5
Alkaline Phosphatase: 76
BUN: 11
Chloride: 106
Glucose, Bld: 155 — ABNORMAL HIGH
Potassium: 4.1
Sodium: 139
Total Bilirubin: 0.4

## 2010-11-01 LAB — URINE CULTURE: Culture: NO GROWTH

## 2010-11-01 LAB — DIFFERENTIAL
Basophils Absolute: 0
Basophils Relative: 0
Eosinophils Absolute: 0.1
Monocytes Absolute: 0.4
Monocytes Relative: 6
Neutro Abs: 4

## 2010-11-01 LAB — CBC
HCT: 41.1
Hemoglobin: 13.6
Platelets: 201
WBC: 6.9

## 2010-11-01 LAB — URINE MICROSCOPIC-ADD ON

## 2010-11-01 LAB — URINALYSIS, ROUTINE W REFLEX MICROSCOPIC
Glucose, UA: 100 — AB
Ketones, ur: NEGATIVE
Leukocytes, UA: NEGATIVE
Nitrite: NEGATIVE
Specific Gravity, Urine: 1.039 — ABNORMAL HIGH
pH: 5.5

## 2010-11-17 LAB — URINALYSIS, ROUTINE W REFLEX MICROSCOPIC
Bilirubin Urine: NEGATIVE
Hgb urine dipstick: NEGATIVE
Ketones, ur: NEGATIVE
Specific Gravity, Urine: 1.026
pH: 5.5

## 2010-11-17 LAB — BASIC METABOLIC PANEL
BUN: 12
BUN: 14
CO2: 24
CO2: 27
Calcium: 8.3 — ABNORMAL LOW
Calcium: 9.2
Chloride: 106
Chloride: 108
Creatinine, Ser: 0.77
Creatinine, Ser: 1.02
GFR calc Af Amer: >60
GFR calc non Af Amer: 55 — ABNORMAL LOW
Glucose, Bld: 274 — ABNORMAL HIGH
Potassium: 3.9
Potassium: 4.6
Sodium: 137

## 2010-11-17 LAB — URINE CULTURE
Colony Count: NO GROWTH
Special Requests: NEGATIVE

## 2010-11-17 LAB — CULTURE, BLOOD (ROUTINE X 2): Culture: NO GROWTH

## 2010-11-17 LAB — DIFFERENTIAL
Basophils Absolute: 0
Basophils Relative: 0
Eosinophils Relative: 0
Eosinophils Relative: 0
Lymphocytes Relative: 9 — ABNORMAL LOW
Lymphs Abs: 1
Lymphs Abs: 1.9
Monocytes Absolute: 0.2
Monocytes Relative: 3
Monocytes Relative: 4
Neutro Abs: 4.8
Neutro Abs: 7.2
Neutro Abs: 9.6 — ABNORMAL HIGH
Neutrophils Relative %: 75
Neutrophils Relative %: 87 — ABNORMAL HIGH

## 2010-11-17 LAB — CBC
HCT: 33.3 — ABNORMAL LOW
Hemoglobin: 11.2 — ABNORMAL LOW
Hemoglobin: 11.9 — ABNORMAL LOW
MCHC: 32.4
MCHC: 33.6
MCHC: 33.8
MCV: 86.4
Platelets: 195
Platelets: 276
RBC: 3.85 — ABNORMAL LOW
RBC: 4.05
RDW: 13.7
RDW: 14.7 — ABNORMAL HIGH
RDW: 14.7 — ABNORMAL HIGH
WBC: 11.1 — ABNORMAL HIGH

## 2010-11-17 LAB — COMPREHENSIVE METABOLIC PANEL
ALT: 15
BUN: 12
Calcium: 8.6
Glucose, Bld: 185 — ABNORMAL HIGH
Sodium: 142
Total Protein: 6.8

## 2010-11-17 LAB — BLOOD GAS, ARTERIAL
Bicarbonate: 21.3
Drawn by: 129801
Drawn by: 235321
FIO2: 0.21
O2 Content: 2
O2 Saturation: 97.5
Patient temperature: 98.6
Patient temperature: 98.6
pH, Arterial: 7.296 — ABNORMAL LOW
pH, Arterial: 7.422 — ABNORMAL HIGH
pO2, Arterial: 102 — ABNORMAL HIGH

## 2011-03-14 ENCOUNTER — Emergency Department (HOSPITAL_COMMUNITY): Payer: 59

## 2011-03-14 ENCOUNTER — Encounter (HOSPITAL_COMMUNITY): Payer: Self-pay | Admitting: Emergency Medicine

## 2011-03-14 ENCOUNTER — Emergency Department (HOSPITAL_COMMUNITY)
Admission: EM | Admit: 2011-03-14 | Discharge: 2011-03-15 | Disposition: A | Discharge status: Home / Self Care | Payer: 59 | Attending: Emergency Medicine | Admitting: Emergency Medicine

## 2011-03-14 DIAGNOSIS — E119 Type 2 diabetes mellitus without complications: Secondary | ICD-10-CM | POA: Insufficient documentation

## 2011-03-14 DIAGNOSIS — H9209 Otalgia, unspecified ear: Secondary | ICD-10-CM | POA: Insufficient documentation

## 2011-03-14 DIAGNOSIS — R1110 Vomiting, unspecified: Secondary | ICD-10-CM | POA: Insufficient documentation

## 2011-03-14 DIAGNOSIS — Y849 Medical procedure, unspecified as the cause of abnormal reaction of the patient, or of later complication, without mention of misadventure at the time of the procedure: Secondary | ICD-10-CM | POA: Insufficient documentation

## 2011-03-14 DIAGNOSIS — H6120 Impacted cerumen, unspecified ear: Secondary | ICD-10-CM

## 2011-03-14 DIAGNOSIS — K9423 Gastrostomy malfunction: Secondary | ICD-10-CM

## 2011-03-14 DIAGNOSIS — K9429 Other complications of gastrostomy: Secondary | ICD-10-CM | POA: Insufficient documentation

## 2011-03-14 DIAGNOSIS — R109 Unspecified abdominal pain: Secondary | ICD-10-CM | POA: Insufficient documentation

## 2011-03-14 DIAGNOSIS — N390 Urinary tract infection, site not specified: Secondary | ICD-10-CM | POA: Insufficient documentation

## 2011-03-14 HISTORY — DX: Disorder of kidney and ureter, unspecified: N28.9

## 2011-03-14 MED ORDER — IBUPROFEN 800 MG PO TABS: 800.0000 mg | ORAL_TABLET | Freq: Once | ORAL | Status: DC

## 2011-03-14 NOTE — ED Notes (Addendum)
Brought in by EMS, with chief c/o of PEG tube being clogged.  Per EMS, spouse of pt called EMS because of clogged peg tube.

## 2011-03-14 NOTE — ED Notes (Signed)
ZOX:[id number]<AV> Expected date:03/14/11<BR> Expected time: 6:36 PM<BR> Means of arrival:Ambulance<BR> Comments:<BR> EMS 70 Gc - vomiting

## 2011-03-14 NOTE — ED Notes (Signed)
Patient transported to CT 

## 2011-03-14 NOTE — ED Notes (Signed)
Pt returns to room 

## 2011-03-15 ENCOUNTER — Other Ambulatory Visit: Payer: Self-pay

## 2011-03-15 ENCOUNTER — Emergency Department (HOSPITAL_COMMUNITY): Payer: 59

## 2011-03-15 ENCOUNTER — Encounter (HOSPITAL_COMMUNITY): Payer: Self-pay | Admitting: Emergency Medicine

## 2011-03-15 LAB — URINALYSIS, ROUTINE W REFLEX MICROSCOPIC
Bilirubin Urine: NEGATIVE
Glucose, UA: NEGATIVE mg/dL
Hgb urine dipstick: NEGATIVE
Ketones, ur: NEGATIVE mg/dL
Nitrite: NEGATIVE
Protein, ur: 30 mg/dL — AB
Specific Gravity, Urine: 1.021 (ref 1.005–1.030)
Urobilinogen, UA: 1 mg/dL (ref 0.0–1.0)
pH: 7 (ref 5.0–8.0)

## 2011-03-15 LAB — POCT I-STAT TROPONIN I: Troponin i, poc: 0 ng/mL (ref 0.00–0.08)

## 2011-03-15 LAB — GLUCOSE, CAPILLARY: Glucose-Capillary: 163 mg/dL — ABNORMAL HIGH (ref 70–99)

## 2011-03-15 LAB — URINE MICROSCOPIC-ADD ON

## 2011-03-15 LAB — COMPREHENSIVE METABOLIC PANEL
ALT: 20 U/L (ref 0–35)
AST: 24 U/L (ref 0–37)
CO2: 25 mEq/L (ref 19–32)
Calcium: 10.2 mg/dL (ref 8.4–10.5)
Chloride: 98 mEq/L (ref 96–112)
GFR calc Af Amer: >90 mL/min (ref >90)
GFR calc non Af Amer: >90 mL/min (ref >90)
Glucose, Bld: 162 mg/dL — ABNORMAL HIGH (ref 70–99)
Sodium: 138 mEq/L (ref 135–145)
Total Bilirubin: 0.3 mg/dL (ref 0.3–1.2)

## 2011-03-15 LAB — DIFFERENTIAL
Basophils Absolute: 0 10*3/uL (ref 0.0–0.1)
Eosinophils Relative: 1 % (ref 0–5)
Lymphocytes Relative: 29 % (ref 12–46)
Lymphs Abs: 3.1 10*3/uL (ref 0.7–4.0)
Monocytes Absolute: 0.8 10*3/uL (ref 0.1–1.0)
Neutro Abs: 6.5 10*3/uL (ref 1.7–7.7)

## 2011-03-15 LAB — CBC
HCT: 38.6 % (ref 36.0–46.0)
MCV: 86.4 fL (ref 78.0–100.0)
Platelets: 249 10*3/uL (ref 150–400)
RBC: 4.47 MIL/uL (ref 3.87–5.11)
WBC: 10.4 10*3/uL (ref 4.0–10.5)

## 2011-03-15 MED ORDER — DEXTROSE 5 % IV SOLN
1.0000 g | Freq: Once | INTRAVENOUS | Status: AC
  Administered 2011-03-15: 1 g via INTRAVENOUS
  Filled 2011-03-15: qty 10

## 2011-03-15 MED ORDER — CEPHALEXIN 500 MG PO CAPS: 500.0000 mg | ORAL_CAPSULE | Freq: Four times a day (QID) | ORAL | Status: AC

## 2011-03-15 NOTE — ED Provider Notes (Signed)
History     CSN: [id number]  Arrival date & time 03/14/11  1924   First MD Initiated Contact with Patient 03/14/11 2158      Chief Complaint  Patient presents with  . Peg tube Clogged    . Otalgia    HPI: Patient is a 71 y.o. female presenting with vomiting. The history is provided by the patient, a relative and the EMS personnel.  Emesis  This is a new problem. The current episode started 6 to 12 hours ago. The problem has not changed since onset.There has been no fever. Associated symptoms include abdominal pain. Pertinent negatives include no chills and no fever.  RN reports pt's husband called to say that he had pt sent t ED because he was unable to get her noon feeding into her PEG tube. NT states upon her arrival to room 18 pt had vomited approx 70 cc emesis. Pt now denies nausea. States she has been having (R) ear pain x 1 week and pain to her suprapubic area. Reports she was recently tx'd for shingles but states this is much improved.  Past Medical History  Diagnosis Date  . Diabetes mellitus   . Renal disorder     Past Surgical History  Procedure Date  . Hand amputation through wrist   . Abdominal surgery   . Knee surgery     History reviewed. No pertinent family history.  History  Substance Use Topics  . Smoking status: Not on file  . Smokeless tobacco: Not on file  . Alcohol Use:     OB History    Grav Para Term Preterm Abortions TAB SAB Ect Mult Living                  Review of Systems  Constitutional: Negative.  Negative for fever and chills.  HENT: Negative.   Eyes: Negative.   Respiratory: Negative.   Cardiovascular: Negative.   Gastrointestinal: Positive for vomiting and abdominal pain.  Genitourinary: Negative.   Musculoskeletal: Negative.   Skin: Negative.   Neurological: Negative.   Hematological: Negative.   Psychiatric/Behavioral: Negative.     Allergies  Review of patient's allergies indicates no known allergies.  Home  Medications  No current outpatient prescriptions on file.  BP 153/83  Pulse 102  Temp(Src) 99.3 F (37.4 C) (Oral)  Resp 18  SpO2 97%  Physical Exam  Constitutional: She is oriented to person, place, and time. She appears well-developed and well-nourished.  HENT:  Head: Normocephalic and atraumatic.  Eyes: Conjunctivae are normal.  Neck: Neck supple.  Cardiovascular: Normal rate and regular rhythm.   Pulmonary/Chest: Effort normal and breath sounds normal.  Abdominal: Soft. Bowel sounds are normal.  Musculoskeletal: Normal range of motion.  Neurological: She is alert and oriented to person, place, and time.  Skin: Skin is warm and dry. No erythema.  Psychiatric: She has a normal mood and affect.    ED Course  Procedures   Date: 03/15/2011  Rate: 103  Rhythm: sinus tachycardia  QRS Axis: normal  Intervals: normal  ST/T Wave abnormalities: normal  Conduction Disutrbances:left anterior fascicular block  Narrative Interpretation:   Old EKG Reviewed: unchanged  0200:  EKG, Trop I and CXR w/o acute findings. U/A grossly positive. CBG 168. Will give IV Rocephin and continue efforts to flush PEG in an effort to get patent.   0600: Patient reports right ear pain has resolved after irrigation. RN was able to get to get PEG tube to flush after multiple  attempts. Will plan for discharge home on Keflex for UTI and encourage close follow up with her primary care physician. Patient agreeable with plan.   Labs Reviewed  GLUCOSE, CAPILLARY - Abnormal; Notable for the following:    Glucose-Capillary 171 (*)    All other components within normal limits  URINALYSIS, ROUTINE W REFLEX MICROSCOPIC - Abnormal; Notable for the following:    APPearance CLOUDY (*)    Protein, ur 30 (*)    Leukocytes, UA LARGE (*)    All other components within normal limits  URINE MICROSCOPIC-ADD ON - Abnormal; Notable for the following:    Bacteria, UA MANY (*)    All other components within normal limits    CBC - Abnormal; Notable for the following:    RDW 16.3 (*)    All other components within normal limits  COMPREHENSIVE METABOLIC PANEL - Abnormal; Notable for the following:    Glucose, Bld 162 (*)    BUN 27 (*)    Total Protein 8.5 (*)    Alkaline Phosphatase 120 (*)    All other components within normal limits  GLUCOSE, CAPILLARY - Abnormal; Notable for the following:    Glucose-Capillary 163 (*)    All other components within normal limits  DIFFERENTIAL  LIPASE, BLOOD  POCT I-STAT TROPONIN I  URINE CULTURE   Dg Chest 2 View  03/15/2011  *RADIOLOGY REPORT*  Clinical Data: Vomiting  CHEST - 2 VIEW  Comparison: 03/31/2010  Findings: Low lung volumes results in crowding and mild lung base atelectasis.  Lungs are otherwise essentially clear. No pleural effusion or pneumothorax. The cardiomediastinal contours are within normal limits. The visualized bones and soft tissues are without significant appreciable abnormality.  IMPRESSION: No acute cardiopulmonary process identified.  Original Report Authenticated By: Waneta Martins, M.D.     No diagnosis found.    MDM  HPI/PE and clinical findings c/w  1. Malfunctioning PEG tube (not patent) Resolved 2. UTI         Leanne Chang, NP 03/15/11 [phone]

## 2011-03-15 NOTE — ED Notes (Signed)
Received a return call from spouse at this time; was updated on pt's condition and successful unclogging of her peg tube.  Was also informed that ambulance will be called to transport her back home as soon as discharge order from MD is in place.

## 2011-03-15 NOTE — ED Notes (Signed)
Attempted to call husband x 2-- not picking up phone; left message in answering service to call [facility] at [phone].

## 2011-03-15 NOTE — ED Notes (Signed)
Spouse was called and obtained verification that peg tube is the main way of pt's getting nutrients; spouse further stated that pt can not have anything by mouth.

## 2011-03-16 LAB — URINE CULTURE: Special Requests: NORMAL

## 2011-03-17 NOTE — ED Notes (Signed)
+   Urine Chart sent to EDP office for review. 

## 2011-03-18 NOTE — ED Notes (Addendum)
Chart returned from EDP office. RX: Bactrim DS 1 tab PO BID X 5 days. Stop Keflex. Prescribed by Fayrene Helper . I spoke with Caretaker and he requested that RX  be called to [facility] on [address].

## 2011-03-18 NOTE — ED Notes (Signed)
Prescription for bactrim ds one po bid x5 days called in to [facility] [address] at [phone] per [facility]ie tran [facility].

## 2011-03-21 NOTE — ED Provider Notes (Addendum)
History     CSN: [id number]  Arrival date & time 03/14/11  1924   First MD Initiated Contact with Patient 03/14/11 2158      Chief Complaint  Patient presents with  . Peg tube Clogged    . Otalgia    (Consider location/radiation/quality/duration/timing/severity/associated sxs/prior treatment) Patient is a 71 y.o. female presenting with ear pain.  Otalgia    Past Medical History  Diagnosis Date  . Diabetes mellitus   . Renal disorder     Past Surgical History  Procedure Date  . Hand amputation through wrist   . Abdominal surgery   . Knee surgery     History reviewed. No pertinent family history.  History  Substance Use Topics  . Smoking status: Not on file  . Smokeless tobacco: Not on file  . Alcohol Use:     OB History    Grav Para Term Preterm Abortions TAB SAB Ect Mult Living                  Review of Systems  HENT: Positive for ear pain.     Allergies  Review of patient's allergies indicates no known allergies.  Home Medications   Current Outpatient Rx  Name Route Sig Dispense Refill  . CEPHALEXIN 500 MG PO CAPS Oral Take 1 capsule (500 mg total) by mouth 4 (four) times daily. 40 capsule 0    BP 153/83  Pulse 102  Temp(Src) 98.3 F (36.8 C) (Oral)  Resp 18  SpO2 97%  Physical Exam  ED Course  Procedures (including critical care time)  Labs Reviewed  GLUCOSE, CAPILLARY - Abnormal; Notable for the following:    Glucose-Capillary 171 (*)    All other components within normal limits  URINALYSIS, ROUTINE W REFLEX MICROSCOPIC - Abnormal; Notable for the following:    APPearance CLOUDY (*)    Protein, ur 30 (*)    Leukocytes, UA LARGE (*)    All other components within normal limits  URINE MICROSCOPIC-ADD ON - Abnormal; Notable for the following:    Bacteria, UA MANY (*)    All other components within normal limits  CBC - Abnormal; Notable for the following:    RDW 16.3 (*)    All other components within normal limits    COMPREHENSIVE METABOLIC PANEL - Abnormal; Notable for the following:    Glucose, Bld 162 (*)    BUN 27 (*)    Total Protein 8.5 (*)    Alkaline Phosphatase 120 (*)    All other components within normal limits  GLUCOSE, CAPILLARY - Abnormal; Notable for the following:    Glucose-Capillary 163 (*)    All other components within normal limits  DIFFERENTIAL  LIPASE, BLOOD  URINE CULTURE  POCT I-STAT TROPONIN I  LAB REPORT - SCANNED   No results found.   1. Urinary tract infection   2. Cerumen impaction   3. PEG tube malfunction       MDM  uti        Arman Filter, NP 03/21/11 1618  Arman Filter, NP 03/24/11 1501

## 2011-03-22 ENCOUNTER — Encounter (HOSPITAL_COMMUNITY): Payer: Self-pay | Admitting: Emergency Medicine

## 2011-03-22 ENCOUNTER — Emergency Department (HOSPITAL_COMMUNITY)
Admission: EM | Admit: 2011-03-22 | Discharge: 2011-03-22 | Disposition: A | Discharge status: Home / Self Care | Payer: 59 | Attending: Emergency Medicine | Admitting: Emergency Medicine

## 2011-03-22 DIAGNOSIS — K9423 Gastrostomy malfunction: Secondary | ICD-10-CM | POA: Insufficient documentation

## 2011-03-22 DIAGNOSIS — G809 Cerebral palsy, unspecified: Secondary | ICD-10-CM | POA: Insufficient documentation

## 2011-03-22 DIAGNOSIS — Z79899 Other long term (current) drug therapy: Secondary | ICD-10-CM | POA: Insufficient documentation

## 2011-03-22 DIAGNOSIS — E119 Type 2 diabetes mellitus without complications: Secondary | ICD-10-CM | POA: Insufficient documentation

## 2011-03-22 NOTE — ED Notes (Signed)
From home c/o gtube plugged, not sure when it happened, husband stated found out G tube is not working vital stable.

## 2011-03-22 NOTE — ED Provider Notes (Signed)
History     CSN: [id number]  Arrival date & time 03/22/11  7829   First MD Initiated Contact with Patient 03/22/11 0316      Chief Complaint  Patient presents with  . GI Problem    (Consider location/radiation/quality/duration/timing/severity/associated sxs/prior treatment) HPI Comments: Cerebral palsy patient with PEG tube has stopped working this afternoon. Patient states she and her husband were unable to flush anything through it. Denies any vomiting, abdominal pain, diarrhea, fever or chills.  No other complaints.  The history is provided by the patient.    Past Medical History  Diagnosis Date  . Diabetes mellitus   . Renal disorder     Past Surgical History  Procedure Date  . Hand amputation through wrist   . Abdominal surgery   . Knee surgery     History reviewed. No pertinent family history.  History  Substance Use Topics  . Smoking status: Not on file  . Smokeless tobacco: Not on file  . Alcohol Use:     OB History    Grav Para Term Preterm Abortions TAB SAB Ect Mult Living                  Review of Systems  Constitutional: Negative for activity change and appetite change.  HENT: Negative for congestion and rhinorrhea.   Respiratory: Negative for chest tightness.   Cardiovascular: Negative for chest pain.  Gastrointestinal: Negative for nausea and abdominal pain.  Genitourinary: Negative for dysuria and hematuria.  Musculoskeletal: Negative for back pain.  Neurological: Negative for headaches.    Allergies  Codeine and Morphine and related  Home Medications   Current Outpatient Rx  Name Route Sig Dispense Refill  . CEPHALEXIN 500 MG PO CAPS Oral Take 1 capsule (500 mg total) by mouth 4 (four) times daily. 40 capsule 0  . CLONAZEPAM 0.5 MG PO TABS Oral Take 0.5 mg by mouth 4 (four) times daily as needed. For anxiety      BP 141/65  Pulse 88  Temp(Src) 98.3 F (36.8 C) (Oral)  Resp 18  SpO2 94%  Physical Exam  Constitutional: She  is oriented to person, place, and time. She appears well-developed and well-nourished. No distress.  HENT:  Head: Normocephalic and atraumatic.  Mouth/Throat: Oropharynx is clear and moist. No oropharyngeal exudate.  Eyes: Conjunctivae and EOM are normal. Pupils are equal, round, and reactive to light.  Neck: Normal range of motion.  Cardiovascular: Normal rate, regular rhythm and normal heart sounds.   Pulmonary/Chest: Effort normal and breath sounds normal. No respiratory distress.  Abdominal: Soft. There is no tenderness. There is no rebound and no guarding.       PEG tube site without erythema or drainage  Musculoskeletal: Normal range of motion. She exhibits no edema and no tenderness.  Neurological: She is alert and oriented to person, place, and time. No cranial nerve deficit.  Skin: Skin is warm.    ED Course  Procedures (including critical care time)  Labs Reviewed - No data to display No results found.   1. PEG tube malfunction       MDM  PEG tube malfunction. No abdominal pain, vomiting.   Nursing staff was able to flush tube after a couple attempts and coca cola use.        Glynn Octave, MD 03/22/11 [phone]

## 2011-03-22 NOTE — ED Notes (Signed)
Able to flush G tube with little resistance, flushed  G tube coke and water, pt tolerated it well.

## 2011-03-22 NOTE — ED Notes (Signed)
Pt reports G-tube is stopped up.  Tried to flush it and husband attempted to put a metal rod down tube to unplug it without success.  No distress noted.

## 2011-03-22 NOTE — Discharge Instructions (Signed)
Care of a Feeding Tube Site  Individuals who have trouble swallowing or cannot take food or medication by mouth are sometimes given feeding tubes. A feeding tube can go into the nose and down to the stomach or through the skin in the abdomen and into the stomach or small bowel. Some of the names of these feeding tubes are gastrostomy tubes, PEG lines, nasogastric tubes, and gastrojejunostomy tubes.   Liquids or foods that have been made into a thick, smooth soup consistency (pureed), along with medications, may be given through the tube. There are several ways to give the liquid food (formula), and there are several kinds of prescribed formulas. Your caregiver will arrange for you to get nutrition in the right amounts for you.  EQUIPMENT NEEDED FOR TUBE FEEDING   A 60 mL syringe.   Formula suggested by your caregiver or dietician.   A specific plug to put into the feeding tube tip during the times when you are not getting food or medications.   A feeding pump or a place to hang your food container (an IV pole or a wall hook) so it can operate by gravity while you are getting your feedings. You attach the tube from the food container to the end of the feeding tube. Your caregivers will help you with these supplies or tell you where to get them.  PROCEDURE FOR TUBE FEEDING  1. Wash your hands before touching the equipment, food, medications, or the site (where the tube enters your body).  2. Check the tube placement before starting the feeding by removing the plug in the end of the feeding tube and attaching a syringe to the feeding tube. Pull the plunger back and you will usually see some yellowish or greenish fluid. This tells you the tube is in the correct place. Note the amount and gently push the residual back into the tube.   Ask your caregiver if there are instances when you would not start tube feedings depending on the amount or type of contents withdrawn from the stomach.   If gastrointestinal (GI)  contents do not show when you pull back on the plunger, measure the length from the stoma site (the opening in your body made for feeding) to the end of your feeding tube. If this different from the previous measurement, you should call your caregiver.   If at any point the feeding tube seems blocked, use your syringe and pull back on the plunger. If this does not work, try to gently force some warm water through the tube. If none of these methods work, call your caregiver. It is important not to miss your medications, food, or water.  3. If everything with the tube seems to be okay, insert the tip of the tube from the food container into your feeding tube.  4. While sitting up or with your head propped up at least 30 degrees to 45 degrees, start the feeding. If you develop coughing or have trouble breathing, stop the feeding immediately.  5. Administer the feeding for the length of time instructed by your caregiver.  6. To keep the tube open, following the feeding, flush the tube with 30 mLs of water using a syringe, and replace the plug at the end of the feeding tube.  7. Do not leave unused food out between feedings. Refrigerate or store it as directed.  GIVING YOUR MEDICATIONS THROUGH THE TUBE   Use liquid forms of your medications, if available.   Some pills or   warm water. Do not use hot water, which could affect the contents.   Ask the pharmacist if you can crush your pills. Do not crush capsules that have SR (sustained release), XR (extended release), or CD (controlled release) printed on them unless your caregiver or pharmacist says it is okay.   After you have crushed your pills or capsules into small pieces or a powder, let the pieces dissolve in warm (not hot) water so that no pieces will clog your tube. Draw medication up into your syringe by pulling back on the plunger.   Attach the syringe to the end of the feeding tube and push on the plunger  to give your medication.   Flush the tube with 30 mLs of water after giving your medication. This makes sure you have received all of your medications.  CARE OF THE SKIN AROUND THE ENTRANCE OF THE TUBE  Check the skin daily where the tube enters the abdomen for redness, irritation, drainage, or tenderness.   Clean around the tube daily with water or soap and water using cotton-tipped applicators or gauze squares. Dry completely after cleaning.   Change the dressing around the tube insertion site daily or as instructed.   Apply antibiotic ointment around the site, if directed by your caregiver.   If the dressing becomes wet or soiled, change it as soon as convenient.   You may use tape to fasten your feeding tube to your skin for comfort or do as directed.  SEEK IMMEDIATE MEDICAL CARE IF:   You develop coughing or have trouble breathing during a feeding. Stop the feeding and call your caregiver immediately.   You notice swelling, redness, drainage, or tenderness at the tube insertion site.   You develop pain,nausea, vomiting, diarrhea, constipation, or bleeding around the tube insertion site.   The tube seems plugged and you are unable to get water through the tube.   There is food leaking from around the tube.   The tube falls out.  Document Released: 01/24/2005 Document Revised: 10/06/2010 Document Reviewed: 04/21/2006 [facility] Patient Information 2012 [place], [place].

## 2011-03-23 NOTE — ED Provider Notes (Signed)
Medical screening examination/treatment/procedure(s) were conducted as a shared visit with non-physician practitioner(s) and myself.  I personally evaluated the patient during the encounter  Cyndra Numbers, MD 03/23/11 [phone]

## 2011-03-24 NOTE — ED Provider Notes (Signed)
Medical screening examination/treatment/procedure(s) were conducted as a shared visit with non-physician practitioner(s) and myself.  I personally evaluated the patient during the encounter  Cyndra Numbers, MD 03/24/11 2112

## 2011-05-06 IMAGING — CT CT PELVIS W/O CM
1 of 2 series · 13 of 32 positions shown, 19 images · non-contrast
Comparison: 06/01/2006

CT ABDOMEN

CLINICAL DATA: Diffuse abdominal pain.  Nausea, vomiting, dysuria.
History of renal carcinoma.  Parkinson's disease.

CT ABDOMEN AND PELVIS WITHOUT CONTRAST (CT UROGRAM)
TECHNIQUE: Contiguous axial images of the abdomen and pelvis
without oral or intravenous contrast were obtained.

[Series 2: under 200# stone no prev · axial · 0.86mm/px · z∈[-441,+14]mm · 13 of 105 slices shown, 19 images]
[im 7/105  soft-tissue]
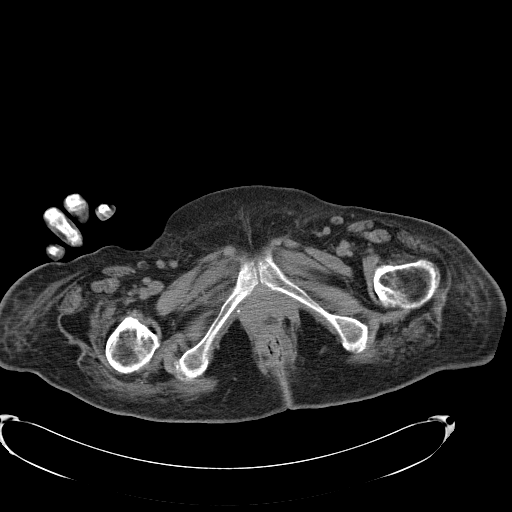
[im 7/105  bone]
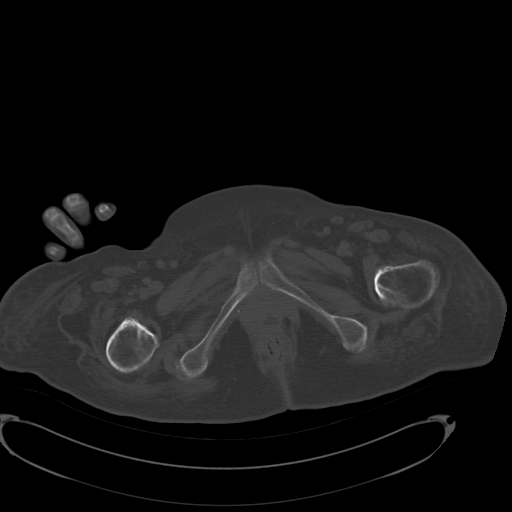
[im 14/105  soft-tissue]
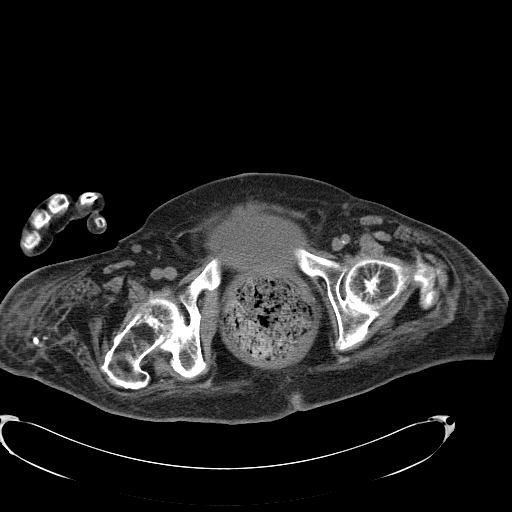
[im 21/105  soft-tissue]
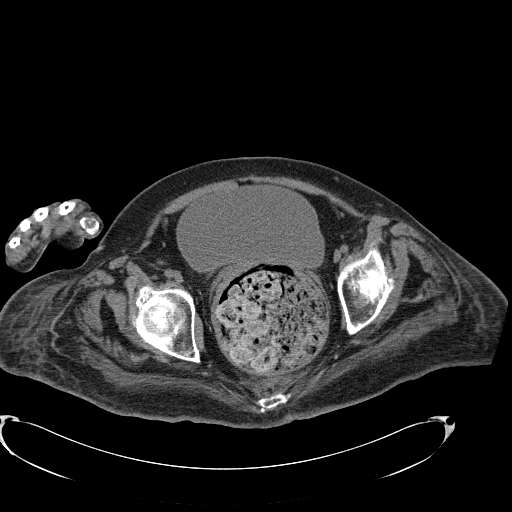
[im 28/105  soft-tissue]
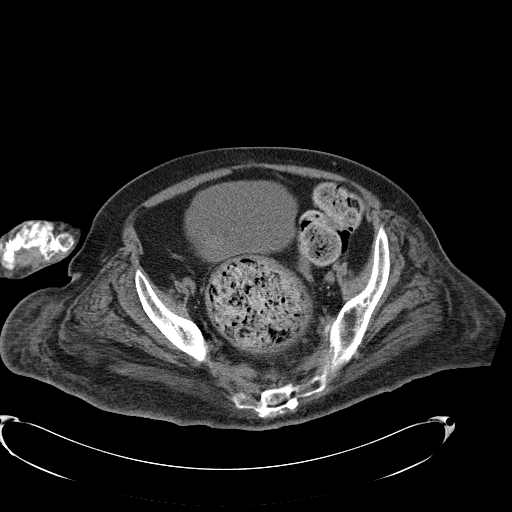
[im 35/105  soft-tissue]
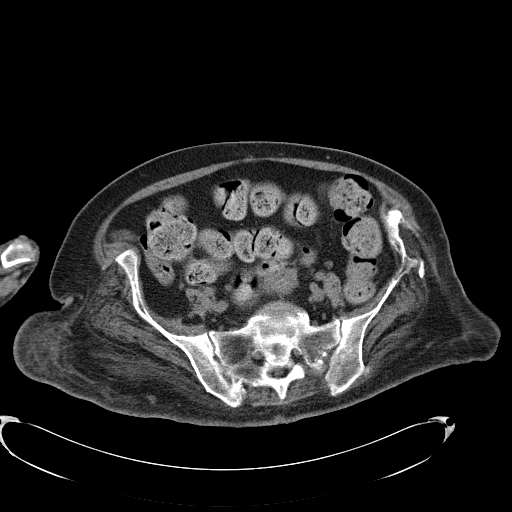
[im 42/105  soft-tissue]
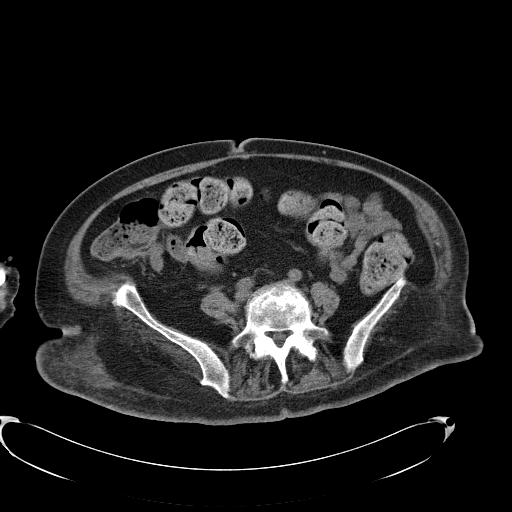
[im 56/105  soft-tissue]
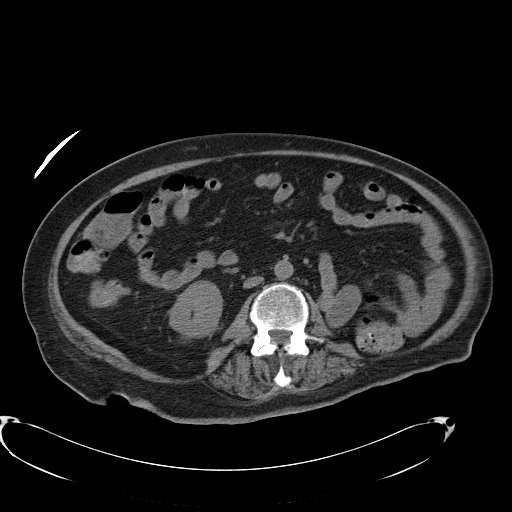
[im 63/105  soft-tissue]
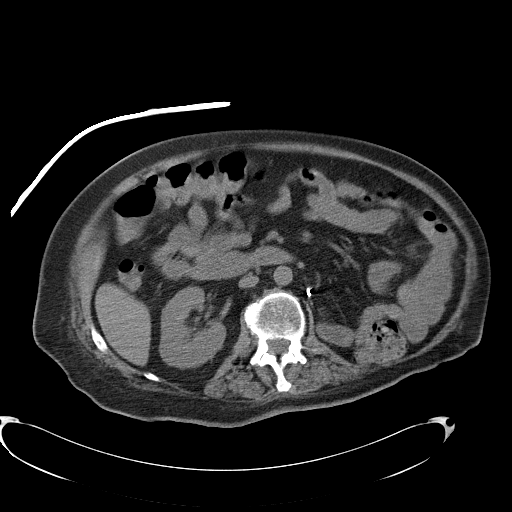
[im 70/105  soft-tissue]
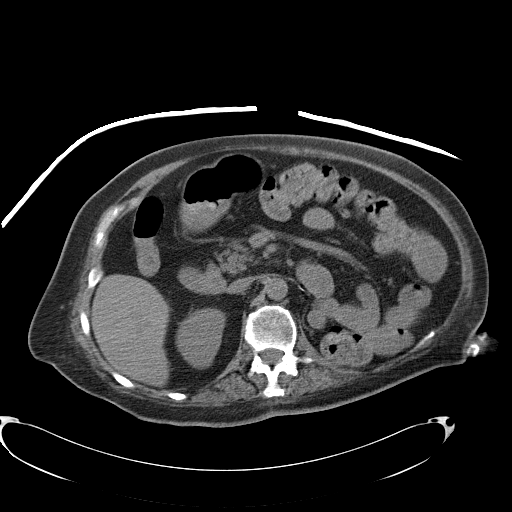
[im 70/105  bone]
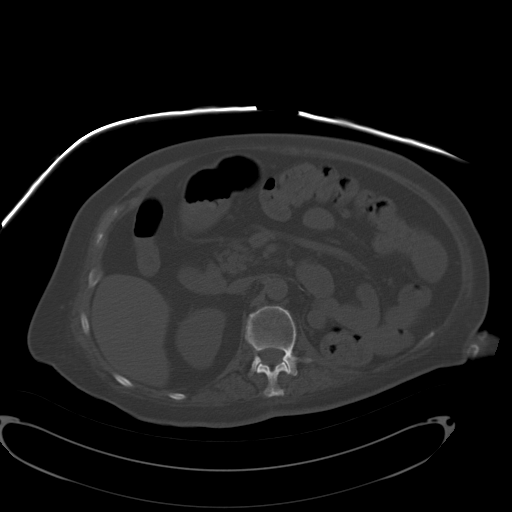
[im 77/105  soft-tissue]
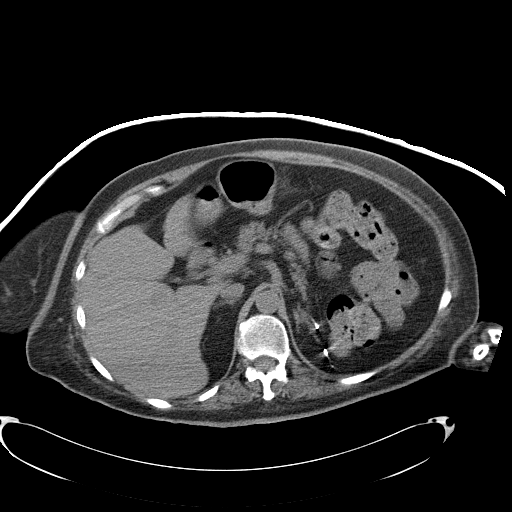
[im 77/105  lung]
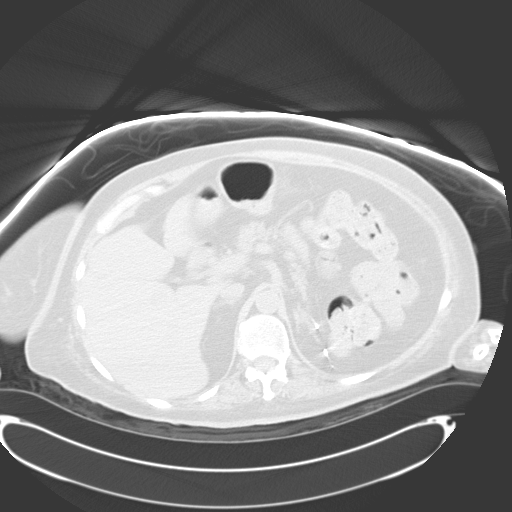
[im 84/105  soft-tissue]
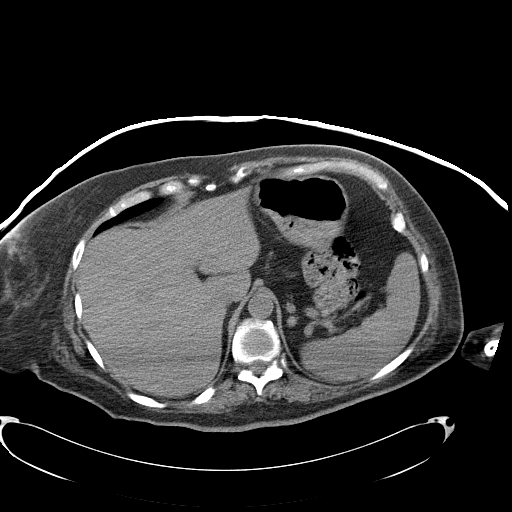
[im 84/105  lung]
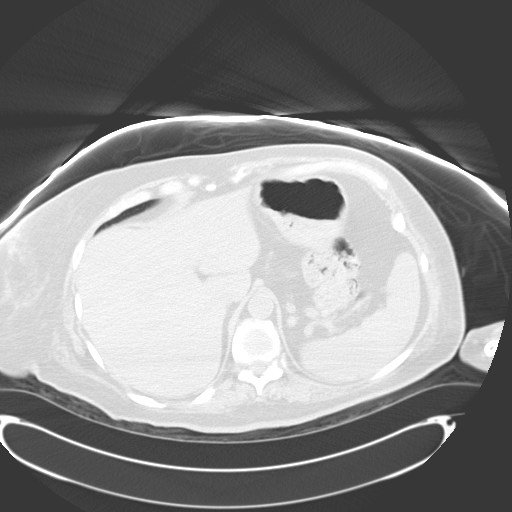
[im 91/105  soft-tissue]
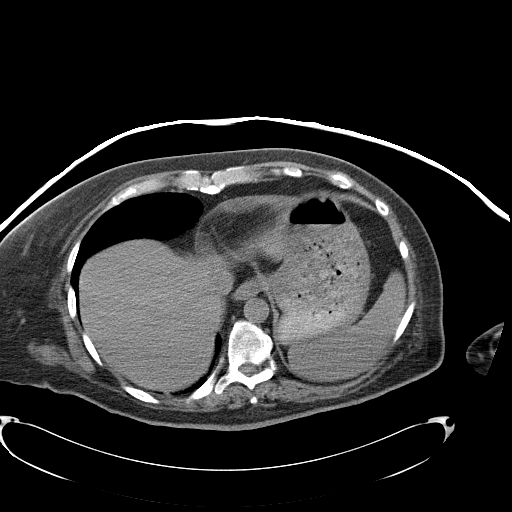
[im 91/105  lung]
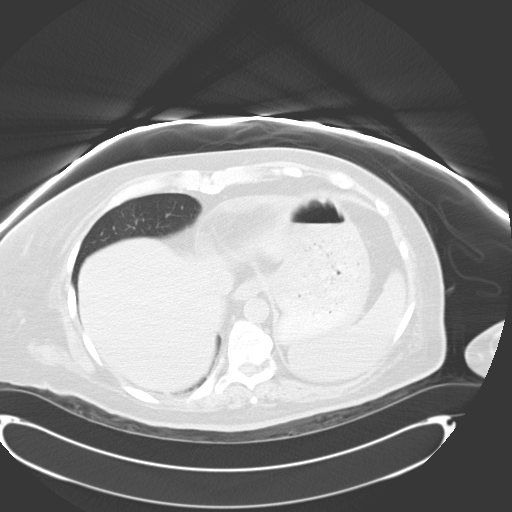
[im 98/105  soft-tissue]
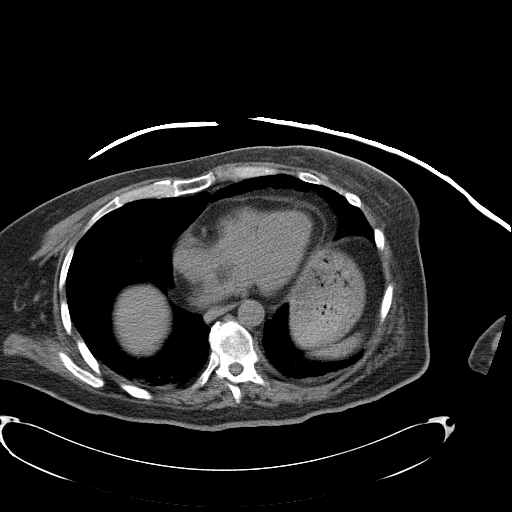
[im 98/105  lung]
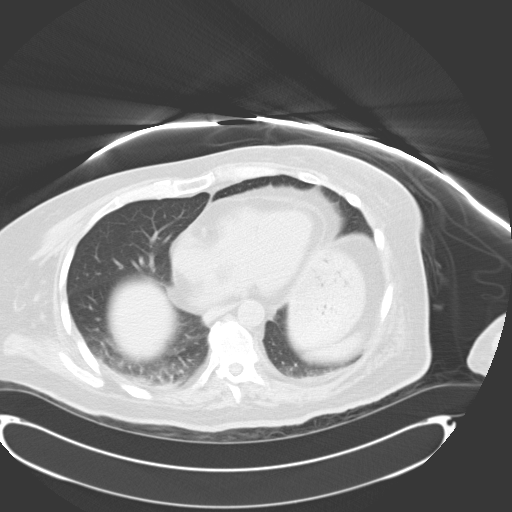

[13 of 32 positions shown; findings below may reference images not displayed]

FINDINGS: Exam is limited for evaluation of entities other than
urinary tract calculi due to lack of oral or intravenous contrast.

 Mild motion degradation at the lung bases.  Clear lung bases.
Mild cardiomegaly.  Small pericardial effusion which is unchanged.

Mild degradation due to patient arm position and motion artifact
within the abdomen.  Normal uninfused appearance of the  spleen,
stomach, pancreas, gallbladder, biliary tract, adrenal glands.  No
right-sided renal calculi or hydronephrosis.  Status post right
nephrectomy without local recurrence. Hepatomegaly.  Greater than
19 cm cranial caudal.

No retroperitoneal or retrocrural adenopathy. Colonic stool burden
suggests constipation.

Scattered colonic diverticula.  Normal terminal ileum. Appendix is
not visualized but there is no evidence of right lower quadrant
inflammation.  Normal abdominal small bowel without ascites.
IMPRESSION: 1.  Status post left nephrectomy.  No evidence of right-sided
urinary tract calculi.
2.  Otherwise low sensitivity exam due to stone study technique,
motion degradation.
3.  Probable constipation.
4.  Cardiomegaly and small stable pericardial effusion.
5.  Hepatomegaly.

CT PELVIS
FINDINGS: No right ureteric stone.

10 cm rectal stool ball suggests fecal impaction.  Right inguinal
versus femoral hernia containing only fat. No pelvic adenopathy.
Normal urinary bladder.  Hysterectomy.  No adnexal mass.   Moderate
osteopenia.  Prior surgery of the left iliac wing .  Subtle
sclerosis in the right femoral head on image 47 coronal.
IMPRESSION: 1.  Findings highly suspicious for fecal impaction.
2. No distal urinary tract calculi or hydroureter.
3.  Possible avascular necrosis of the right femoral head.
Correlate with right hip symptoms.

## 2011-05-20 ENCOUNTER — Encounter (HOSPITAL_COMMUNITY): Payer: Self-pay | Admitting: Emergency Medicine

## 2011-05-20 ENCOUNTER — Emergency Department (HOSPITAL_COMMUNITY)
Admission: EM | Admit: 2011-05-20 | Discharge: 2011-05-21 | Disposition: A | Discharge status: Home / Self Care | Payer: 59 | Attending: Emergency Medicine | Admitting: Emergency Medicine

## 2011-05-20 DIAGNOSIS — K9429 Other complications of gastrostomy: Secondary | ICD-10-CM | POA: Insufficient documentation

## 2011-05-20 DIAGNOSIS — E119 Type 2 diabetes mellitus without complications: Secondary | ICD-10-CM | POA: Insufficient documentation

## 2011-05-20 DIAGNOSIS — K9423 Gastrostomy malfunction: Secondary | ICD-10-CM

## 2011-05-20 NOTE — ED Notes (Signed)
[id number]<AV> Expected date:<BR> Expected time:10:33 PM<BR> Means of arrival:<BR> Comments:<BR> PT[facility]32 - 68yoF G-tube blocked

## 2011-05-20 NOTE — ED Notes (Signed)
Per EMS: pt comes from home and presents with blockage in gastric tube. Pt was seen at PCP this morning and tube was functioning properly.  Pt's husband states he tried to give pt's meds tonight and was unable to do so. Pt has not eaten tonight due to blockage. CBG reading of 238 per ems. Pt has not had any of her regular meds, including insulin tonight.

## 2011-05-20 NOTE — ED Notes (Signed)
Pt presents to ED with blockage in gastric tube that pt first noticed tonight. Pt states her husband attempted to give meds through tube and was unable to do so. Pt denies pain. No redness or trauma noted at site. Pt states she's had g-tube for 2 months but had to get it replaced once already. Pt was unable to recall how long ago she had the g-tube replacement.

## 2011-05-21 ENCOUNTER — Emergency Department (HOSPITAL_COMMUNITY): Payer: 59

## 2011-05-21 NOTE — Discharge Instructions (Signed)
Return to the hospital if you should develop severe pain, vomiting, fevers. See your doctor on Monday if possible to reevaluate your feeding tube site.

## 2011-05-21 NOTE — ED Provider Notes (Signed)
History     CSN: [id number]  Arrival date & time 05/20/11  2231   First MD Initiated Contact with Patient 05/21/11 0020      Chief Complaint  Patient presents with  . GI Problem    g-tube blockage     (Consider location/radiation/quality/duration/timing/severity/associated sxs/prior treatment) HPI Comments: 71 year old female history of cerebral palsy, diabetes, takes feeds through the G-tube and states that G-tube is working appropriately at [employer] office this morning however later in the day when the husband tried to flush the tube after it was utilized ,it appeared to be blocked. She has no pain no nausea no fevers no coughing or shortness of breath and no other complaints.  Patient is a 71 y.o. female presenting with GI illlness. The history is provided by the patient and the EMS personnel.  GI Problem     Past Medical History  Diagnosis Date  . Diabetes mellitus   . Renal disorder     Past Surgical History  Procedure Date  . Hand amputation through wrist   . Abdominal surgery   . Knee surgery     History reviewed. No pertinent family history.  History  Substance Use Topics  . Smoking status: Not on file  . Smokeless tobacco: Not on file  . Alcohol Use:     OB History    Grav Para Term Preterm Abortions TAB SAB Ect Mult Living                  Review of Systems  All other systems reviewed and are negative.    Allergies  Codeine and Morphine and related  Home Medications   Current Outpatient Rx  Name Route Sig Dispense Refill  . CLONAZEPAM 0.5 MG PO TABS Oral Take 0.5 mg by mouth 4 (four) times daily as needed. For anxiety      BP 160/69  Pulse 102  Temp(Src) 98.6 F (37 C) (Oral)  Resp 16  SpO2 96%  Physical Exam  Nursing note and vitals reviewed. Constitutional: She appears well-developed and well-nourished. No distress.  HENT:  Head: Normocephalic and atraumatic.  Mouth/Throat: Oropharynx is clear and moist. No oropharyngeal  exudate.  Eyes: Conjunctivae and EOM are normal. Pupils are equal, round, and reactive to light. Right eye exhibits no discharge. Left eye exhibits no discharge. No scleral icterus.  Neck: Normal range of motion. Neck supple. No JVD present. No thyromegaly present.  Cardiovascular: Normal rate, regular rhythm, normal heart sounds and intact distal pulses.  Exam reveals no gallop and no friction rub.   No murmur heard. Pulmonary/Chest: Effort normal and breath sounds normal. No respiratory distress. She has no wheezes. She has no rales.  Abdominal: Soft. Bowel sounds are normal. She exhibits no distension and no mass. There is no tenderness.       G-tube present, mid abdomen, no surrounding redness swelling tenderness or purulent discharge  Musculoskeletal: She exhibits no edema and no tenderness.  Lymphadenopathy:    She has no cervical adenopathy.  Neurological: She is alert.  Skin: Skin is warm and dry. No rash noted. No erythema.  Psychiatric: She has a normal mood and affect. Her behavior is normal.    ED Course  Gastrostomy tube replacement Date/Time: 05/21/2011 2:00 AM Performed by: Eber Hong D Authorized by: Eber Hong D Consent: Verbal consent obtained. Risks and benefits: risks, benefits and alternatives were discussed Consent given by: patient Patient understanding: patient states understanding of the procedure being performed Patient identity confirmed: verbally with  patient Time out: Immediately prior to procedure a "time out" was called to verify the correct patient, procedure, equipment, support staff and site/side marked as required. Local anesthesia used: no Patient sedated: no Patient tolerance: Patient tolerated the procedure well with no immediate complications. Comments: G-tube replaced without difficulty, balloon inflated, well seated in the stomach. Imaging ordered to confirm placement   (including critical care time)  Labs Reviewed - No data to  display Dg Abd 1 View  05/21/2011  *RADIOLOGY REPORT*  Clinical Data: G tube placement.  ABDOMEN - 1 VIEW  Comparison: None.  Findings: Contrast injected through the patient's percutaneous gastrostomy tube shows appropriate position within the gastric antrum.  There is no evidence of contrast leak or extravasation. No evidence of dilated bowel loops.  Surgical clips are seen within the left upper quadrant.  IMPRESSION: Percutaneous gastrostomy tube in appropriate position within the gastric antrum.  No contrast leak or extravasation.  Original Report Authenticated By: Danae Orleans, M.D.     1. Gastrostomy tube dysfunction       MDM  Attempt to flush tube, replace as needed   G-tube replaced, imaging shows that it is seated in the stomach without any complications. Patient stable for discharge home  Vida Roller, MD 05/21/11 [phone]

## 2011-05-21 NOTE — ED Notes (Signed)
Attempted to flush g-tube and was unable to do so. MD notified and requested a tube replacement.

## 2011-05-21 NOTE — ED Notes (Signed)
Dr. Hyacinth Meeker, EDP replacement peg tube. New tube flushes well and pt tolerated change well.  Orders are in for imaging.

## 2011-06-14 ENCOUNTER — Emergency Department (HOSPITAL_COMMUNITY)
Admission: EM | Admit: 2011-06-14 | Discharge: 2011-06-15 | Disposition: A | Discharge status: Home / Self Care | Payer: 59 | Attending: Emergency Medicine | Admitting: Emergency Medicine

## 2011-06-14 ENCOUNTER — Encounter (HOSPITAL_COMMUNITY): Payer: Self-pay

## 2011-06-14 DIAGNOSIS — E119 Type 2 diabetes mellitus without complications: Secondary | ICD-10-CM | POA: Insufficient documentation

## 2011-06-14 DIAGNOSIS — Z09 Encounter for follow-up examination after completed treatment for conditions other than malignant neoplasm: Secondary | ICD-10-CM | POA: Insufficient documentation

## 2011-06-14 DIAGNOSIS — Y849 Medical procedure, unspecified as the cause of abnormal reaction of the patient, or of later complication, without mention of misadventure at the time of the procedure: Secondary | ICD-10-CM | POA: Insufficient documentation

## 2011-06-14 DIAGNOSIS — K9420 Gastrostomy complication, unspecified: Secondary | ICD-10-CM

## 2011-06-14 DIAGNOSIS — Z79899 Other long term (current) drug therapy: Secondary | ICD-10-CM | POA: Insufficient documentation

## 2011-06-14 DIAGNOSIS — K9423 Gastrostomy malfunction: Secondary | ICD-10-CM | POA: Insufficient documentation

## 2011-06-14 NOTE — ED Notes (Signed)
[id number]<VW> Expected date:<BR> Expected time:<BR> Means of arrival:<BR> Comments:<BR> EMS/peg tube complications

## 2011-06-14 NOTE — ED Notes (Signed)
Pt from home.  Lives w/family.  Feeding tube has been blocked this evening.  EMS BP168/88, 90.  Pt didn't have any complaints for EMS.

## 2011-06-15 NOTE — ED Notes (Signed)
[facility] called for patient transport home.  

## 2011-06-15 NOTE — ED Provider Notes (Signed)
History     CSN: [id number]  Arrival date & time 06/14/11  2135   First MD Initiated Contact with Patient 06/14/11 2216      Chief Complaint  Patient presents with  . Wound Check    (Consider location/radiation/quality/duration/timing/severity/associated sxs/prior treatment) The history is provided by the patient.   the patient receives tube feeds and reports that her gastrostomy tube became clogged this evening.  She has no abdominal pain.  She's had no nausea or vomiting.  She denies fevers.  The patient has no other complaints.  Past Medical History  Diagnosis Date  . Diabetes mellitus   . Renal disorder     Past Surgical History  Procedure Date  . Hand amputation through wrist   . Abdominal surgery   . Knee surgery     History reviewed. No pertinent family history.  History  Substance Use Topics  . Smoking status: Not on file  . Smokeless tobacco: Not on file  . Alcohol Use:     OB History    Grav Para Term Preterm Abortions TAB SAB Ect Mult Living                  Review of Systems  All other systems reviewed and are negative.    Allergies  Codeine and Morphine and related  Home Medications   Current Outpatient Rx  Name Route Sig Dispense Refill  . CLONAZEPAM 0.5 MG PO TABS Oral Take 0.5 mg by mouth 4 (four) times daily as needed. For anxiety    . DIPHENOXYLATE-ATROPINE 2.5-0.025 MG PO TABS Oral Take 1 tablet by mouth 4 (four) times daily as needed. Diarrhea      BP 160/81  Pulse 97  Temp(Src) 98.8 F (37.1 C) (Oral)  Resp 18  SpO2 94%  Physical Exam  Nursing note and vitals reviewed. Constitutional: She appears well-developed and well-nourished. No distress.  HENT:  Head: Normocephalic and atraumatic.  Eyes: EOM are normal.  Neck: Normal range of motion.  Cardiovascular: Normal rate, regular rhythm and normal heart sounds.   Pulmonary/Chest: Effort normal and breath sounds normal.  Abdominal: Soft. She exhibits no distension. There is  no tenderness.       G-tube is located in left upper quadrant without surrounding erythema or drainage.  It appears to be clogged with tube feeds.  Musculoskeletal: Normal range of motion.  Neurological: She is alert.  Skin: Skin is warm and dry.  Psychiatric: She has a normal mood and affect. Judgment normal.    ED Course  Gastrostomy tube replacement Performed by: Lyanne Co Authorized by: Lyanne Co Required items: required blood products, implants, devices, and special equipment available Patient identity confirmed: verbally with patient Time out: Immediately prior to procedure a "time out" was called to verify the correct patient, procedure, equipment, support staff and site/side marked as required. Patient tolerance: Patient tolerated the procedure well with no immediate complications. Comments: Gastrostomy tube replaced with a 24 [place] G-tube.  This passed easily without difficulty.  Stomach contents were aspirated from the G-tube.     (including critical care time)  Labs Reviewed - No data to display No results found.   1. Complication of gastrostomy       MDM  G-tube was replaced.  This went without difficulty.  Is no indication for radiographic evidence of appropriate placement.  Stomach contents were aspirated.        Lyanne Co, MD 06/15/11 [phone]

## 2011-06-15 NOTE — Discharge Instructions (Signed)
Care of a Feeding Tube Site  Individuals who have trouble swallowing or cannot take food or medication by mouth are sometimes given feeding tubes. A feeding tube can go into the nose and down to the stomach or through the skin in the abdomen and into the stomach or small bowel. Some of the names of these feeding tubes are gastrostomy tubes, PEG lines, nasogastric tubes, and gastrojejunostomy tubes.   Liquids or foods that have been made into a thick, smooth soup consistency (pureed), along with medications, may be given through the tube. There are several ways to give the liquid food (formula), and there are several kinds of prescribed formulas. Your caregiver will arrange for you to get nutrition in the right amounts for you.  EQUIPMENT NEEDED FOR TUBE FEEDING   A 60 mL syringe.   Formula suggested by your caregiver or dietician.   A specific plug to put into the feeding tube tip during the times when you are not getting food or medications.   A feeding pump or a place to hang your food container (an IV pole or a wall hook) so it can operate by gravity while you are getting your feedings. You attach the tube from the food container to the end of the feeding tube. Your caregivers will help you with these supplies or tell you where to get them.  PROCEDURE FOR TUBE FEEDING  1. Wash your hands before touching the equipment, food, medications, or the site (where the tube enters your body).  2. Check the tube placement before starting the feeding by removing the plug in the end of the feeding tube and attaching a syringe to the feeding tube. Pull the plunger back and you will usually see some yellowish or greenish fluid. This tells you the tube is in the correct place. Note the amount and gently push the residual back into the tube.   Ask your caregiver if there are instances when you would not start tube feedings depending on the amount or type of contents withdrawn from the stomach.   If gastrointestinal (GI)  contents do not show when you pull back on the plunger, measure the length from the stoma site (the opening in your body made for feeding) to the end of your feeding tube. If this different from the previous measurement, you should call your caregiver.   If at any point the feeding tube seems blocked, use your syringe and pull back on the plunger. If this does not work, try to gently force some warm water through the tube. If none of these methods work, call your caregiver. It is important not to miss your medications, food, or water.  3. If everything with the tube seems to be okay, insert the tip of the tube from the food container into your feeding tube.  4. While sitting up or with your head propped up at least 30 degrees to 45 degrees, start the feeding. If you develop coughing or have trouble breathing, stop the feeding immediately.  5. Administer the feeding for the length of time instructed by your caregiver.  6. To keep the tube open, following the feeding, flush the tube with 30 mLs of water using a syringe, and replace the plug at the end of the feeding tube.  7. Do not leave unused food out between feedings. Refrigerate or store it as directed.  GIVING YOUR MEDICATIONS THROUGH THE TUBE   Use liquid forms of your medications, if available.   Some pills or   tablets may be crushed and put into warm water. Do not use hot water, which could affect the contents.   Ask the pharmacist if you can crush your pills. Do not crush capsules that have SR (sustained release), XR (extended release), or CD (controlled release) printed on them unless your caregiver or pharmacist says it is okay.   After you have crushed your pills or capsules into small pieces or a powder, let the pieces dissolve in warm (not hot) water so that no pieces will clog your tube. Draw medication up into your syringe by pulling back on the plunger.   Attach the syringe to the end of the feeding tube and push on the plunger to give your  medication.   Flush the tube with 30 mLs of water after giving your medication. This makes sure you have received all of your medications.  CARE OF THE SKIN AROUND THE ENTRANCE OF THE TUBE   Check the skin daily where the tube enters the abdomen for redness, irritation, drainage, or tenderness.   Clean around the tube daily with water or soap and water using cotton-tipped applicators or gauze squares. Dry completely after cleaning.   Change the dressing around the tube insertion site daily or as instructed.   Apply antibiotic ointment around the site, if directed by your caregiver.   If the dressing becomes wet or soiled, change it as soon as convenient.   You may use tape to fasten your feeding tube to your skin for comfort or do as directed.  SEEK IMMEDIATE MEDICAL CARE IF:    You develop coughing or have trouble breathing during a feeding. Stop the feeding and call your caregiver immediately.   You notice swelling, redness, drainage, or tenderness at the tube insertion site.   You develop pain,nausea, vomiting, diarrhea, constipation, or bleeding around the tube insertion site.   The tube seems plugged and you are unable to get water through the tube.   There is food leaking from around the tube.   The tube falls out.  Document Released: 01/24/2005 Document Revised: 01/13/2011 Document Reviewed: 04/21/2006  [employer] Patient Information 2012 [employer]

## 2011-10-27 ENCOUNTER — Emergency Department (HOSPITAL_COMMUNITY): Payer: 59

## 2011-10-27 ENCOUNTER — Encounter (HOSPITAL_COMMUNITY): Payer: Self-pay

## 2011-10-27 ENCOUNTER — Emergency Department (HOSPITAL_COMMUNITY)
Admission: EM | Admit: 2011-10-27 | Discharge: 2011-10-28 | Disposition: A | Discharge status: Home / Self Care | Payer: 59 | Attending: Emergency Medicine | Admitting: Emergency Medicine

## 2011-10-27 DIAGNOSIS — K5900 Constipation, unspecified: Secondary | ICD-10-CM | POA: Insufficient documentation

## 2011-10-27 DIAGNOSIS — Z79899 Other long term (current) drug therapy: Secondary | ICD-10-CM | POA: Insufficient documentation

## 2011-10-27 DIAGNOSIS — R1031 Right lower quadrant pain: Secondary | ICD-10-CM | POA: Insufficient documentation

## 2011-10-27 LAB — CBC WITH DIFFERENTIAL/PLATELET
Basophils Relative: 0 % (ref 0–1)
Eosinophils Absolute: 0.2 10*3/uL (ref 0.0–0.7)
Hemoglobin: 11.6 g/dL — ABNORMAL LOW (ref 12.0–15.0)
Lymphs Abs: 2.4 10*3/uL (ref 0.7–4.0)
MCH: 29 pg (ref 26.0–34.0)
Neutro Abs: 2.9 10*3/uL (ref 1.7–7.7)
Neutrophils Relative %: 49 % (ref 43–77)
Platelets: 190 10*3/uL (ref 150–400)
RBC: 4 MIL/uL (ref 3.87–5.11)

## 2011-10-27 LAB — COMPREHENSIVE METABOLIC PANEL
AST: 27 U/L (ref 0–37)
BUN: 22 mg/dL (ref 6–23)
CO2: 24 mEq/L (ref 19–32)
Calcium: 9.7 mg/dL (ref 8.4–10.5)
Chloride: 95 mEq/L — ABNORMAL LOW (ref 96–112)
Creatinine, Ser: 0.43 mg/dL — ABNORMAL LOW (ref 0.50–1.10)
GFR calc Af Amer: >90 mL/min (ref >90)
GFR calc non Af Amer: >90 mL/min (ref >90)
Glucose, Bld: 149 mg/dL — ABNORMAL HIGH (ref 70–99)
Total Bilirubin: 0.3 mg/dL (ref 0.3–1.2)

## 2011-10-27 MED ORDER — DICYCLOMINE HCL 10 MG/ML IM SOLN
20.0000 mg | Freq: Once | INTRAMUSCULAR | Status: AC
  Administered 2011-10-27: 20 mg via INTRAMUSCULAR
  Filled 2011-10-27: qty 2

## 2011-10-27 MED ORDER — SODIUM CHLORIDE 0.9 % IV SOLN
20.0000 mL | INTRAVENOUS | Status: DC
  Administered 2011-10-27: 20 mL via INTRAVENOUS

## 2011-10-27 NOTE — ED Notes (Signed)
Pt returned from xray

## 2011-10-27 NOTE — ED Provider Notes (Signed)
History     CSN: [id number]  Arrival date & time 10/27/11  5621   First MD Initiated Contact with Patient 10/27/11 2006      Chief Complaint  Patient presents with  . Abdominal Pain    (Consider location/radiation/quality/duration/timing/severity/associated sxs/prior treatment) HPI  She presents to the emergency departments with complaints of right lower quadrant abdominal pain. He says that the pain has been going on for 3 weeks and is a 10 out of 10. She recently had a feeding tube placed because she has been having difficulty with swallowing and the food going down the trachea. She lives at home with her husband and is normally bed bound that she has cerebral palsy. She does admit that she's been constipated recently but denies nausea, vomiting, diarrhea. She denies any urinary symptoms. Patient is in no acute distress at this time and her vital signs are stable.  Past Medical History  Diagnosis Date  . Diabetes mellitus   . Renal disorder     Past Surgical History  Procedure Date  . Hand amputation through wrist   . Abdominal surgery   . Knee surgery     History reviewed. No pertinent family history.  History  Substance Use Topics  . Smoking status: Not on file  . Smokeless tobacco: Not on file  . Alcohol Use:     OB History    Grav Para Term Preterm Abortions TAB SAB Ect Mult Living                  Review of Systems   Review of Systems  Gen: no weight loss, fevers, chills, night sweats  Eyes: no discharge or drainage, no occular pain or visual changes  Nose: no epistaxis or rhinorrhea  Mouth: no dental pain, no sore throat  Neck: no neck pain  Lungs:No wheezing, coughing or hemoptysis CV: no chest pain, palpitations, dependent edema or orthopnea  Abd: + abdominal pain and constipation, nausea, vomiting  GU: no dysuria or gross hematuria  MSK:  No abnormalities  Neuro: no headache, no focal neurologic deficits  Skin: no abnormalities Psyche:  negative.    Allergies  Codeine and Morphine and related  Home Medications   Current Outpatient Rx  Name Route Sig Dispense Refill  . ATORVASTATIN CALCIUM 40 MG PO TABS Oral Take 40 mg by mouth at bedtime.    Marland Kitchen CLONAZEPAM 1 MG PO TABS Oral Take 2 mg by mouth 2 (two) times daily.    . INSULIN GLARGINE 100 UNIT/ML [place] SOLN Subcutaneous Inject 50 Units into the skin 2 (two) times daily.    Marland Kitchen METFORMIN HCL 1000 MG PO TABS Oral Take 1,000 mg by mouth 2 (two) times daily with a meal.    . PAROXETINE HCL 40 MG PO TABS Oral Take 40 mg by mouth daily.    Marland Kitchen PREGABALIN 150 MG PO CAPS Oral Take 150 mg by mouth 2 (two) times daily.    Marland Kitchen RANITIDINE HCL 300 MG PO TABS Oral Take 300 mg by mouth at bedtime.    Bernadette Hoit SODIUM 8.6-50 MG PO TABS Oral Take 1 tablet by mouth daily. 14 tablet 1    BP 151/63  Pulse 93  Temp 98.6 F (37 C)  Resp 16  SpO2 97%  Physical Exam  Nursing note and vitals reviewed. Constitutional: She appears well-developed and well-nourished. No distress.       Obese, bed bound with cerebral palsy  HENT:  Head: Normocephalic and atraumatic.  Eyes:  Pupils are equal, round, and reactive to light.  Neck: Normal range of motion. Neck supple.  Cardiovascular: Normal rate and regular rhythm.   Pulmonary/Chest: Effort normal.  Abdominal: Soft. She exhibits no distension and no mass. There is tenderness (mild diffuse tenderness. no localized RLQ tenderness on exam). There is no rebound and no guarding.  Genitourinary: Rectal exam shows no external hemorrhoid, no fissure, no mass and no tenderness.       Large amount of stool in rectal vault  Neurological: She is alert.  Skin: Skin is warm and dry.    ED Course  Procedures (including critical care time)  Labs Reviewed  COMPREHENSIVE METABOLIC PANEL - Abnormal; Notable for the following:    Sodium 132 (*)     Chloride 95 (*)     Glucose, Bld 149 (*)     Creatinine, Ser 0.43 (*)     Albumin 3.2 (*)     All  other components within normal limits  CBC WITH DIFFERENTIAL - Abnormal; Notable for the following:    Hemoglobin 11.6 (*)     RDW 15.8 (*)     All other components within normal limits   Ct Abdomen Pelvis W Contrast  10/28/2011  *RADIOLOGY REPORT*  Clinical Data: Abdominal pain and weakness.  CT ABDOMEN AND PELVIS WITH CONTRAST  Technique:  Multidetector CT imaging of the abdomen and pelvis was performed following the standard protocol during bolus administration of intravenous contrast.  Contrast: OMNIPAQUE IOHEXOL 300 MG/ML  SOLN  Comparison: Chest and two views abdomen earlier this same date.  CT abdomen and pelvis 06/26/2009.  Findings: There is some dependent atelectasis in the lung bases. Small pericardial effusion is unchanged.  The patient has a feeding tube in place.  The stomach is otherwise unremarkable.  The small bowel appears normal without evidence of obstruction.  There is a large volume of stool in the descending colon through the rectum with a large stool ball noted in the rectum.  There is no free intraperitoneal air or pneumatosis.  The liver is low attenuating consistent with fatty infiltration. No focal liver lesion is identified.  The gallbladder, spleen, adrenal glands, pancreas and right kidney appear normal.  The patient is status post left nephrectomy.  There is no lymphadenopathy or fluid.  Subcutaneous edema in the buttocks is noted.  Musculature appears diffusely atrophied.  Defect in the anterior left iliac wing is likely due to old trauma and unchanged.  IMPRESSION:  1.  Negative for bowel obstruction.  No acute finding. 2.  Large stool burden descending colon through the rectum.  Large stool ball in the rectum is compatible with fecal impaction. 3.  Status post left nephrectomy. 4.  Fatty infiltration of the liver.   Original Report Authenticated By: Bernadene Bell. D'ALESSIO, M.D.    Dg Abd 2 Views  10/27/2011  *RADIOLOGY REPORT*  Clinical Data: Abdominal pain and  weakness.  ABDOMEN - 2 VIEW  Comparison: Single view abdomen 05/21/2011.  Findings: A gastrostomy tube is in place. Fluid levels are present within nondilated loops of small bowel.  There is no evidence for obstruction or free air.  Moderate amount of stool is present in the distal sigmoid colon and rectum.  IMPRESSION:  1.  Fluid levels within nondilated small bowel.  This may represent a focal ileus. 2.  Moderate stool within the distal sigmoid colon and rectum without obstruction.   Original Report Authenticated By: Jamesetta Orleans. MATTERN, M.D.      1.  Constipation       MDM  Patient's CT of the abdomen shows that she has a large amount of stool the rectum. It also shows that she has a stool ball. While in the emergency department she passed the ball of stool on her own. By digital rectal exam he has been able to get some soft stool out as well. Patient does admit to having some relief. Otherwise her labs are nonacute there are no other findings that require intervention at this time. I've written her for some subacute and she is to followup with her primary care Dr.   Rock Nephew has been advised of the symptoms that warrant their return to the ED. Patient has voiced understanding and has agreed to follow-up with the PCP or specialist.      Dorthula Matas, PA 10/28/11 [phone]

## 2011-10-27 NOTE — ED Notes (Signed)
Pt attempting to drink oral contrast

## 2011-10-27 NOTE — ED Notes (Signed)
Bed:WA21<BR> Expected date:<BR> Expected time:<BR> Means of arrival:<BR> Comments:<BR> ems

## 2011-10-27 NOTE — ED Notes (Signed)
Pt with left sided abdominal pain which started several weeks ago but worsened today with nausea.  Denies vomiting or diarrhea.  Denies urinary symptoms.  Rates pain a 10/10

## 2011-10-27 NOTE — ED Notes (Signed)
Per EMS, pt c/o abdominal pain x 3 weeks, states pain is 10/10. Pt recently had feeding tube placed 2 days ago. Pt has Hx Cerebral palsy and DM per EMS. Pt is normally bed bound.
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
## 2011-10-28 ENCOUNTER — Emergency Department (HOSPITAL_COMMUNITY): Payer: 59

## 2011-10-28 MED ORDER — IOHEXOL 300 MG/ML  SOLN
100.0000 mL | Freq: Once | INTRAMUSCULAR | Status: AC | PRN
  Administered 2011-10-28: 100 mL via INTRAVENOUS

## 2011-10-28 MED ORDER — SENNOSIDES-DOCUSATE SODIUM 8.6-50 MG PO TABS: 1.0000 | ORAL_TABLET | Freq: Every day | ORAL | Status: DC

## 2011-10-28 NOTE — ED Notes (Addendum)
Attempted disimpaction of pt.  Stool ball noted in diaper already.  Rectal feces soft and unable to pull.  PA notified.

## 2011-10-28 NOTE — ED Notes (Signed)
Notified for transport home.

## 2011-10-29 NOTE — ED Provider Notes (Signed)
Medical screening examination/treatment/procedure(s) were performed by non-physician practitioner and as supervising physician I was immediately available for consultation/collaboration.  Toy Baker, MD 10/29/11 [phone]

## 2011-11-16 ENCOUNTER — Other Ambulatory Visit: Payer: Self-pay | Admitting: Family Medicine

## 2011-11-16 DIAGNOSIS — Z1231 Encounter for screening mammogram for malignant neoplasm of breast: Secondary | ICD-10-CM

## 2011-12-20 ENCOUNTER — Other Ambulatory Visit: Payer: Self-pay | Admitting: Family Medicine

## 2011-12-20 ENCOUNTER — Inpatient Hospital Stay: Admission: RE | Admit: 2011-12-20 | Payer: Medicare Other | Source: Ambulatory Visit

## 2011-12-20 DIAGNOSIS — Z1239 Encounter for other screening for malignant neoplasm of breast: Secondary | ICD-10-CM

## 2011-12-26 ENCOUNTER — Other Ambulatory Visit: Payer: Medicare Other

## 2011-12-28 ENCOUNTER — Other Ambulatory Visit: Payer: Medicare Other

## 2012-01-11 ENCOUNTER — Ambulatory Visit
Admission: RE | Admit: 2012-01-11 | Discharge: 2012-01-11 | Disposition: A | Discharge status: Home / Self Care | Payer: Medicare Other | Source: Ambulatory Visit | Attending: Family Medicine | Admitting: Family Medicine

## 2012-01-11 DIAGNOSIS — Z1239 Encounter for other screening for malignant neoplasm of breast: Secondary | ICD-10-CM

## 2012-03-02 ENCOUNTER — Encounter (HOSPITAL_COMMUNITY): Payer: Self-pay | Admitting: Emergency Medicine

## 2012-03-02 ENCOUNTER — Emergency Department (HOSPITAL_COMMUNITY): Payer: 59

## 2012-03-02 ENCOUNTER — Inpatient Hospital Stay (HOSPITAL_COMMUNITY)
Admission: EM | Admit: 2012-03-02 | Discharge: 2012-03-05 | DRG: 690 | Disposition: A | Discharge status: Home / Self Care | Payer: 59 | Attending: Internal Medicine | Admitting: Internal Medicine

## 2012-03-02 DIAGNOSIS — N390 Urinary tract infection, site not specified: Principal | ICD-10-CM | POA: Diagnosis present

## 2012-03-02 DIAGNOSIS — B9689 Other specified bacterial agents as the cause of diseases classified elsewhere: Secondary | ICD-10-CM | POA: Diagnosis present

## 2012-03-02 DIAGNOSIS — G20 Parkinson's disease: Secondary | ICD-10-CM | POA: Diagnosis present

## 2012-03-02 DIAGNOSIS — E119 Type 2 diabetes mellitus without complications: Secondary | ICD-10-CM

## 2012-03-02 DIAGNOSIS — F329 Major depressive disorder, single episode, unspecified: Secondary | ICD-10-CM | POA: Diagnosis present

## 2012-03-02 DIAGNOSIS — Z79899 Other long term (current) drug therapy: Secondary | ICD-10-CM

## 2012-03-02 DIAGNOSIS — F419 Anxiety disorder, unspecified: Secondary | ICD-10-CM | POA: Diagnosis present

## 2012-03-02 DIAGNOSIS — F3289 Other specified depressive episodes: Secondary | ICD-10-CM | POA: Diagnosis present

## 2012-03-02 DIAGNOSIS — F79 Unspecified intellectual disabilities: Secondary | ICD-10-CM | POA: Diagnosis present

## 2012-03-02 DIAGNOSIS — Z7401 Bed confinement status: Secondary | ICD-10-CM

## 2012-03-02 DIAGNOSIS — G20A1 Parkinson's disease without dyskinesia, without mention of fluctuations: Secondary | ICD-10-CM | POA: Diagnosis present

## 2012-03-02 DIAGNOSIS — Z931 Gastrostomy status: Secondary | ICD-10-CM

## 2012-03-02 DIAGNOSIS — E860 Dehydration: Secondary | ICD-10-CM

## 2012-03-02 DIAGNOSIS — R509 Fever, unspecified: Secondary | ICD-10-CM | POA: Diagnosis present

## 2012-03-02 DIAGNOSIS — F32A Depression, unspecified: Secondary | ICD-10-CM

## 2012-03-02 DIAGNOSIS — E785 Hyperlipidemia, unspecified: Secondary | ICD-10-CM

## 2012-03-02 DIAGNOSIS — K5900 Constipation, unspecified: Secondary | ICD-10-CM

## 2012-03-02 DIAGNOSIS — G809 Cerebral palsy, unspecified: Secondary | ICD-10-CM | POA: Diagnosis present

## 2012-03-02 DIAGNOSIS — F411 Generalized anxiety disorder: Secondary | ICD-10-CM | POA: Diagnosis present

## 2012-03-02 HISTORY — DX: Dysphagia, unspecified: R13.10

## 2012-03-02 HISTORY — DX: Parkinsonism, unspecified: G20.C

## 2012-03-02 HISTORY — DX: Pneumonitis due to inhalation of food and vomit: J69.0

## 2012-03-02 HISTORY — DX: Malignant neoplasm of unspecified kidney, except renal pelvis: C64.9

## 2012-03-02 HISTORY — DX: Parkinson's disease: G20

## 2012-03-02 HISTORY — DX: Other specified disorders of bladder: N32.89

## 2012-03-02 HISTORY — DX: Cerebral palsy, unspecified: G80.9

## 2012-03-02 LAB — CBC WITH DIFFERENTIAL/PLATELET
Basophils Absolute: 0 10*3/uL (ref 0.0–0.1)
HCT: 44.4 % (ref 36.0–46.0)
Lymphocytes Relative: 40 % (ref 12–46)
Lymphs Abs: 4 10*3/uL (ref 0.7–4.0)
Neutro Abs: 5.2 10*3/uL (ref 1.7–7.7)
Platelets: 296 10*3/uL (ref 150–400)
RBC: 4.77 MIL/uL (ref 3.87–5.11)
RDW: 15.4 % (ref 11.5–15.5)
WBC: 9.9 10*3/uL (ref 4.0–10.5)

## 2012-03-02 LAB — BLOOD GAS, VENOUS
Acid-base deficit: 0.3 mmol/L (ref 0.0–2.0)
O2 Saturation: 77.6 %
Patient temperature: 98.6
TCO2: 23.2 mmol/L (ref 0–100)

## 2012-03-02 LAB — URINE MICROSCOPIC-ADD ON

## 2012-03-02 LAB — URINALYSIS, ROUTINE W REFLEX MICROSCOPIC
Glucose, UA: 500 mg/dL — AB
Leukocytes, UA: NEGATIVE
Protein, ur: 30 mg/dL — AB
Specific Gravity, Urine: 1.03 (ref 1.005–1.030)
pH: 5.5 (ref 5.0–8.0)

## 2012-03-02 LAB — BASIC METABOLIC PANEL
CO2: 22 mEq/L (ref 19–32)
Chloride: 99 mEq/L (ref 96–112)
Sodium: 136 mEq/L (ref 135–145)

## 2012-03-02 LAB — GLUCOSE, CAPILLARY: Glucose-Capillary: 331 mg/dL — ABNORMAL HIGH (ref 70–99)

## 2012-03-02 MED ORDER — ACETAMINOPHEN 325 MG PO TABS
650.0000 mg | ORAL_TABLET | Freq: Once | ORAL | Status: AC
  Administered 2012-03-02: 650 mg via ORAL
  Filled 2012-03-02: qty 2

## 2012-03-02 MED ORDER — DEXTROSE 5 % IV SOLN
1.0000 g | Freq: Once | INTRAVENOUS | Status: AC
  Administered 2012-03-02: 1 g via INTRAVENOUS
  Filled 2012-03-02: qty 10

## 2012-03-02 MED ORDER — SODIUM CHLORIDE 0.9 % IV BOLUS (SEPSIS)
1000.0000 mL | Freq: Once | INTRAVENOUS | Status: AC
Start: 2012-03-02 — End: 2012-03-03
  Administered 2012-03-02: 1000 mL via INTRAVENOUS

## 2012-03-02 MED ORDER — SODIUM CHLORIDE 0.9 % IV SOLN
Freq: Once | INTRAVENOUS | Status: AC
  Administered 2012-03-02: 22:00:00 via INTRAVENOUS

## 2012-03-02 MED ORDER — SODIUM CHLORIDE 0.9 % IV BOLUS (SEPSIS)
500.0000 mL | Freq: Once | INTRAVENOUS | Status: AC
  Administered 2012-03-02: 500 mL via INTRAVENOUS

## 2012-03-02 MED ORDER — SODIUM CHLORIDE 0.9 % IV SOLN: Freq: Once | INTRAVENOUS | Status: DC

## 2012-03-02 NOTE — ED Provider Notes (Signed)
History     CSN: [id number]  Arrival date & time 03/02/12  1308   First MD Initiated Contact with Patient 03/02/12 2004      Chief Complaint  Patient presents with  . Fever    (Consider location/radiation/quality/duration/timing/severity/associated sxs/prior treatment) HPI History provided by EMS.  Level 5 caveat applies d/t mental retardation.  Pt is bedridden, receives medications through G-tube and is cared for by her husband.  Family checked her temp today because she was sweating and it was 100.3.  Her BG was elevated at 340 as well.  She is reportedly mentating at baseline.    Past Medical History  Diagnosis Date  . Diabetes mellitus   . Renal disorder     Past Surgical History  Procedure Date  . Hand amputation through wrist   . Abdominal surgery   . Knee surgery     No family history on file.  History  Substance Use Topics  . Smoking status: Not on file  . Smokeless tobacco: Not on file  . Alcohol Use:     OB History    Grav Para Term Preterm Abortions TAB SAB Ect Mult Living                  Review of Systems  All other systems reviewed and are negative.    Allergies  Codeine and Morphine and related  Home Medications   Current Outpatient Rx  Name  Route  Sig  Dispense  Refill  . ATORVASTATIN CALCIUM 40 MG PO TABS   Oral   Take 40 mg by mouth at bedtime.         Marland Kitchen CLONAZEPAM 1 MG PO TABS   Oral   Take 2 mg by mouth 2 (two) times daily.         . INSULIN GLARGINE 100 UNIT/ML  SOLN   Subcutaneous   Inject 50 Units into the skin 2 (two) times daily.         Marland Kitchen METFORMIN HCL 1000 MG PO TABS   Oral   Take 1,000 mg by mouth 2 (two) times daily with a meal.         . PAROXETINE HCL 40 MG PO TABS   Oral   Take 40 mg by mouth daily.         Marland Kitchen PREGABALIN 150 MG PO CAPS   Oral   Take 150 mg by mouth 2 (two) times daily.         Marland Kitchen RANITIDINE HCL 300 MG PO TABS   Oral   Take 300 mg by mouth at bedtime.         Bernadette Hoit SODIUM 8.6-50 MG PO TABS   Oral   Take 1 tablet by mouth daily.   14 tablet   1     BP 165/72  Pulse 109  Temp 101.4 F (38.6 C) (Rectal)  Resp 24  SpO2 96%  Physical Exam  Nursing note and vitals reviewed. Constitutional: She is oriented to person, place, and time. She appears well-developed and well-nourished. No distress.  HENT:  Head: Normocephalic and atraumatic.       Dry mucous membranes  Eyes:       Normal appearance  Neck: Normal range of motion.  Cardiovascular: Regular rhythm.        Tachycardic at 108bpm  Pulmonary/Chest: Effort normal. No respiratory distress. She has no wheezes.       Mild, diffuse congestion  Abdominal: Soft. Bowel sounds are  normal. She exhibits no distension.       G-tube in place.  No drainage from stoma or surrounding cellulitis.  Pt verbally reports tenderness but exact location unclear; no grimacing.    Musculoskeletal:       Mid-forearm amputation of LUE.  Muscular atrophy of LEs  Neurological: She is alert and oriented to person, place, and time.       Pt only responded to a few of my questions.  She asked for a coke.  Followed commands.   Skin: Skin is warm and dry. No rash noted.       No bedsores   Psychiatric: She has a normal mood and affect. Her behavior is normal.    ED Course  Procedures (including critical care time)  Labs Reviewed  GLUCOSE, CAPILLARY - Abnormal; Notable for the following:    Glucose-Capillary 331 (*)     All other components within normal limits  BASIC METABOLIC PANEL - Abnormal; Notable for the following:    Glucose, Bld 294 (*)     BUN 39 (*)     GFR calc non Af Amer 89 (*)     All other components within normal limits  URINALYSIS, ROUTINE W REFLEX MICROSCOPIC - Abnormal; Notable for the following:    APPearance CLOUDY (*)     Glucose, UA 500 (*)     Protein, ur 30 (*)     All other components within normal limits  URINE MICROSCOPIC-ADD ON - Abnormal; Notable for the  following:    Bacteria, UA MANY (*)     All other components within normal limits  GLUCOSE, CAPILLARY - Abnormal; Notable for the following:    Glucose-Capillary 210 (*)     All other components within normal limits  LACTIC ACID, PLASMA - Abnormal; Notable for the following:    Lactic Acid, Venous 2.9 (*)     All other components within normal limits  BLOOD GAS, VENOUS - Abnormal; Notable for the following:    pH, Ven 7.338 (*)     Bicarbonate 25.5 (*)     All other components within normal limits  CBC WITH DIFFERENTIAL  URINE CULTURE   Dg Chest 2 View  03/02/2012  *RADIOLOGY REPORT*  Clinical Data: Shortness of breath.  Unresponsive patient.  CHEST - 2 VIEW  Comparison: Two-view chest x-ray 03/15/2011, 03/31/2010.  Findings: Cardiac silhouette upper normal in size to perhaps slightly enlarged for the AP semi-erect technique, unchanged. Hilar and mediastinal contours unremarkable.  Lungs clear. Bronchovascular markings normal.  Pulmonary vascularity normal.  No pneumothorax.  No pleural effusions.  Stable chronic elevation of the right hemidiaphragm.  Slight thoracic scoliosis convex left may be in part positional.  IMPRESSION: Stable borderline heart size.  No acute cardiopulmonary disease.   Original Report Authenticated By: Hulan Saas, M.D.      1. Constipation   2. Dehydration   3. Fever       MDM  71yo immobile F w/ diabetes and cerebral palsy sent to ED by family via EMS for fever.  She is reportedly mentating at baseline, but is unable to provide any history and no family present.  On exam, febrile, mildly tachycardic, dry mucous membranes, lungs congested, abd benign.  Cbg elevated at 331.  Labs and CXR pending.  Pt to receive IVF and tylenol.  8:29 PM   Labs sig for bacteruria and elevated lactate.  Urine sent for culture.  CXR neg.  IV rocephin has already been administered and will order  Vanc as well.  She has received 1.5L NS bolus so far.  On re-examination, VSS and  patient answers all questions appropriately.  11:25 PM   [facility] consulted for admission. They requested CT abd/pelvis d/t tenderness on repeat exam.         Otilio Miu, PA-C 03/03/12 [phone]

## 2012-03-02 NOTE — ED Notes (Signed)
Per EMS: Pt is from home. Pt is bedridden, care is provided by husband. Family states that pt has sweating and checked her temp, which was 100.3 F, Family checked CBG at 1545 and result was 340; husband gave her levemir at 79; upon EMS arrival was 475 at 57. Pt is at neuro baseline.

## 2012-03-03 ENCOUNTER — Emergency Department (HOSPITAL_COMMUNITY): Payer: 59

## 2012-03-03 ENCOUNTER — Encounter (HOSPITAL_COMMUNITY): Payer: Self-pay | Admitting: Internal Medicine

## 2012-03-03 DIAGNOSIS — K5900 Constipation, unspecified: Secondary | ICD-10-CM

## 2012-03-03 DIAGNOSIS — F32A Depression, unspecified: Secondary | ICD-10-CM | POA: Diagnosis present

## 2012-03-03 DIAGNOSIS — E860 Dehydration: Secondary | ICD-10-CM

## 2012-03-03 DIAGNOSIS — E119 Type 2 diabetes mellitus without complications: Secondary | ICD-10-CM | POA: Diagnosis present

## 2012-03-03 DIAGNOSIS — F329 Major depressive disorder, single episode, unspecified: Secondary | ICD-10-CM | POA: Diagnosis present

## 2012-03-03 DIAGNOSIS — R509 Fever, unspecified: Secondary | ICD-10-CM

## 2012-03-03 DIAGNOSIS — E785 Hyperlipidemia, unspecified: Secondary | ICD-10-CM | POA: Diagnosis present

## 2012-03-03 LAB — HEMOGLOBIN A1C: Mean Plasma Glucose: 203 mg/dL — ABNORMAL HIGH (ref <117)

## 2012-03-03 LAB — GLUCOSE, CAPILLARY
Glucose-Capillary: 69 mg/dL — ABNORMAL LOW (ref 70–99)
Glucose-Capillary: 112 mg/dL — ABNORMAL HIGH (ref 70–99)
Glucose-Capillary: 119 mg/dL — ABNORMAL HIGH (ref 70–99)

## 2012-03-03 MED ORDER — IBUPROFEN 200 MG PO TABS
400.0000 mg | ORAL_TABLET | Freq: Once | ORAL | Status: AC
  Administered 2012-03-03: 400 mg via ORAL
  Filled 2012-03-03: qty 2

## 2012-03-03 MED ORDER — SENNOSIDES-DOCUSATE SODIUM 8.6-50 MG PO TABS
1.0000 | ORAL_TABLET | Freq: Every day | ORAL | Status: DC
  Administered 2012-03-03 – 2012-03-05 (×3): 1 via ORAL
  Filled 2012-03-03 (×3): qty 1

## 2012-03-03 MED ORDER — PREGABALIN 75 MG PO CAPS
150.0000 mg | ORAL_CAPSULE | Freq: Two times a day (BID) | ORAL | Status: DC
  Administered 2012-03-03 – 2012-03-05 (×5): 150 mg via ORAL
  Filled 2012-03-03: qty 1
  Filled 2012-03-03: qty 2
  Filled 2012-03-03: qty 1
  Filled 2012-03-03 (×3): qty 2

## 2012-03-03 MED ORDER — FAMOTIDINE 40 MG PO TABS
40.0000 mg | ORAL_TABLET | Freq: Every day | ORAL | Status: DC
  Administered 2012-03-04 (×2): 40 mg via ORAL
  Filled 2012-03-03 (×4): qty 1

## 2012-03-03 MED ORDER — INSULIN ASPART 100 UNIT/ML ~~LOC~~ SOLN
0.0000 [IU] | Freq: Three times a day (TID) | SUBCUTANEOUS | Status: DC
  Administered 2012-03-04: 11 [IU] via SUBCUTANEOUS
  Administered 2012-03-04: 7 [IU] via SUBCUTANEOUS
  Administered 2012-03-04: 15 [IU] via SUBCUTANEOUS
  Administered 2012-03-05: 11 [IU] via SUBCUTANEOUS
  Administered 2012-03-05 (×2): 7 [IU] via SUBCUTANEOUS

## 2012-03-03 MED ORDER — INSULIN GLARGINE 100 UNIT/ML ~~LOC~~ SOLN
55.0000 [IU] | Freq: Two times a day (BID) | SUBCUTANEOUS | Status: DC
  Administered 2012-03-03 – 2012-03-05 (×5): 55 [IU] via SUBCUTANEOUS

## 2012-03-03 MED ORDER — PAROXETINE HCL 20 MG PO TABS
40.0000 mg | ORAL_TABLET | Freq: Every day | ORAL | Status: DC
  Administered 2012-03-03 – 2012-03-05 (×3): 40 mg via ORAL
  Filled 2012-03-03 (×4): qty 2

## 2012-03-03 MED ORDER — HEPARIN SODIUM (PORCINE) 5000 UNIT/ML IJ SOLN
5000.0000 [IU] | Freq: Three times a day (TID) | INTRAMUSCULAR | Status: DC
  Administered 2012-03-03 – 2012-03-04 (×3): 5000 [IU] via SUBCUTANEOUS
  Filled 2012-03-03 (×7): qty 1

## 2012-03-03 MED ORDER — INSULIN GLARGINE 100 UNIT/ML ~~LOC~~ SOLN: 50.0000 [IU] | Freq: Two times a day (BID) | SUBCUTANEOUS | Status: DC

## 2012-03-03 MED ORDER — BISACODYL 5 MG PO TBEC
5.0000 mg | DELAYED_RELEASE_TABLET | Freq: Every day | ORAL | Status: DC | PRN
  Filled 2012-03-03: qty 1

## 2012-03-03 MED ORDER — SODIUM CHLORIDE 0.9 % IV SOLN
INTRAVENOUS | Status: DC
  Administered 2012-03-03 – 2012-03-05 (×4): via INTRAVENOUS

## 2012-03-03 MED ORDER — MAGNESIUM CITRATE PO SOLN
1.0000 | Freq: Once | ORAL | Status: DC | PRN
  Filled 2012-03-03: qty 296

## 2012-03-03 MED ORDER — JEVITY 1.2 CAL PO LIQD
1000.0000 mL | ORAL | Status: DC
  Administered 2012-03-03: 1000 mL

## 2012-03-03 MED ORDER — VANCOMYCIN HCL IN DEXTROSE 1-5 GM/200ML-% IV SOLN
1000.0000 mg | Freq: Once | INTRAVENOUS | Status: AC
  Administered 2012-03-03: 1000 mg via INTRAVENOUS
  Filled 2012-03-03: qty 200

## 2012-03-03 MED ORDER — ATORVASTATIN CALCIUM 40 MG PO TABS
40.0000 mg | ORAL_TABLET | Freq: Every day | ORAL | Status: DC
  Administered 2012-03-04 (×2): 40 mg via ORAL
  Filled 2012-03-03 (×4): qty 1

## 2012-03-03 MED ORDER — IOHEXOL 300 MG/ML  SOLN: 50.0000 mL | Freq: Once | INTRAMUSCULAR | Status: DC | PRN

## 2012-03-03 MED ORDER — GLUCOSE 40 % PO GEL
ORAL | Status: AC
  Administered 2012-03-03: 37.5 g
  Filled 2012-03-03: qty 1

## 2012-03-03 MED ORDER — CLONAZEPAM 1 MG PO TABS
2.0000 mg | ORAL_TABLET | Freq: Two times a day (BID) | ORAL | Status: DC
  Administered 2012-03-03 – 2012-03-05 (×5): 2 mg via ORAL
  Filled 2012-03-03: qty 1
  Filled 2012-03-03: qty 2
  Filled 2012-03-03 (×2): qty 1
  Filled 2012-03-03 (×2): qty 2

## 2012-03-03 MED ORDER — IOHEXOL 300 MG/ML  SOLN
50.0000 mL | Freq: Once | INTRAMUSCULAR | Status: AC | PRN
  Administered 2012-03-03: 50 mL via ORAL

## 2012-03-03 MED ORDER — DEXTROSE 5 % IV SOLN
1.0000 g | INTRAVENOUS | Status: DC
  Administered 2012-03-03 – 2012-03-04 (×2): 1 g via INTRAVENOUS
  Filled 2012-03-03 (×3): qty 10

## 2012-03-03 NOTE — ED Notes (Signed)
Patient transported to CT 

## 2012-03-03 NOTE — Progress Notes (Signed)
Brief Nutrition Note  Interventions:  1. Resume home regimen of Jevity 1.2 at 55 ml/hr via PEG continuously. This regimen provides 1584 kcal, 74 grams protein, 223 grams carbohydrate, and 1065 ml free water. 2. If pt continues to experience elevated blood sugars, may switch formula to Glucerna 1.2 at 55 ml/hr. This goal regimen would provide: 1584 kcal, 79 grams protein, 151 grams carbohydrate, and 1063 ml free water. 3. Once IVF discontinued, recommend free water flushes of 200 ml QID to provide an additional 800 ml water daily. 4. RD to continue to follow nutrition care plan.  Consult received for enteral/tube feeding initiation and management.  Adult Enteral Nutrition Protocol initiated. Full assessment to follow.  This RD called husband, [name] at home [phone]), to discuss home enteral nutrition regimen. Per husband, pt receives Jevity 1.2 at 55 ml/hr continuously. This regimen provides 1584 kcal, 74 grams protein, 223 grams carbohydrate, and 1065 ml free water. Husband reports that pt has been tolerating TF regimen well and weight has been stable. Asked husband if pt receives free water flushes and he states that pt receives water when he administers medications; RD unable to determine if pt is receiving free water flushes at home.  This RD called unit RN to discuss initiation of enteral nutrition.  Note, if pt continues to experience elevated blood sugars, may switch formula to Glucerna 1.2 at 55 ml/hr. This goal regimen would provide: 1584 kcal, 79 grams protein, 151 grams carbohydrate, and 1063 ml free water.  Admitting Dx: Dehydration [276.51] Fever [780.60] Constipation [564.00] FEVER  CMP     Component Value Date/Time   NA 136 03/02/2012 2100   K 4.5 03/02/2012 2100   CL 99 03/02/2012 2100   CO2 22 03/02/2012 2100   GLUCOSE 294* 03/02/2012 2100   BUN 39* 03/02/2012 2100   CREATININE 0.64 03/02/2012 2100   CALCIUM 10.0 03/02/2012 2100   PROT 7.0 10/27/2011 2015   ALBUMIN 3.2* 10/27/2011 2015   AST 27 10/27/2011 2015   ALT 20 10/27/2011 2015   ALKPHOS 114 10/27/2011 2015   BILITOT 0.3 10/27/2011 2015   GFRNONAA 89* 03/02/2012 2100   GFRAA >90 03/02/2012 2100    Phosphorus  Date/Time Value Range Status  09/01/2009  4:11 AM 3.0  2.3 - 4.6 mg/dL Final  0/98/1191  4:78 AM 3.2  2.3 - 4.6 mg/dL Final   Magnesium  Date/Time Value Range Status  09/02/2009  5:30 AM 1.9  1.5 - 2.5 mg/dL Final  2/95/6213  0:86 AM 1.4* 1.5 - 2.5 mg/dL Final  5/78/4696  2:95 AM 1.9  1.5 - 2.5 mg/dL Final   CBG (last 3)   Basename 03/02/12 2234 03/02/12 1931  GLUCAP 210* 331*   Jarold Motto MS, RD, LDN Pager: [phone] After-hours pager: [phone]

## 2012-03-03 NOTE — Progress Notes (Signed)
[facility] PROGRESS NOTE  TASHIKA GOODIN UJW:[id number] DOB: 08-02-1940 DOA: 03/02/2012 PCP: No primary provider on file.  Brief narrative:  Addendum to admission note done today 03/03/2012  by my colleague 72 year old female with past medical history of anxiety and depression, mental retardation, bed bound, DM and dyslipidemia who presented to ED 03/03/2012 for fever and sweating. In the ED, patient was found to have some abdominal tenderness to palpation, but CT scan just showed constipation, CXR negative, UA negative. Vancomycin and rocephin started empirically in ED.  Assessment/Plan:  Principal Problem:  *Fever  Possible GI etiology, diverticulitis  No UTI or pneumonia obvious on UA or CXR respectively  D/C vanco and continue rocephin  Blood cultures not obtained on admission; we will collect blood cultures if pt spikes a fever  Active Problems:  Constipation  Will intensify regimen to include miralax and bisacodyl  Anxiety and depression  Continue clonazepam, paxil and lyrica  Dyslipidemia  Continue atorvastatin  Diabetes mellitus  glargine 55 units BID  Sliding scale  Code Status: full code Family Communication: no family at bedside Disposition Plan: home when stable  Manson Passey, MD  [facility] Pager [phone]  If 7PM-7AM, please contact night-coverage [email or web address] Password [facility] 03/03/2012, 10:31 AM   LOS: 1 day   Consultants:  None   Procedures:  None   Antibiotics:  Rocephin 03/03/2012 -->  HPI/Subjective: No acute events since admission  Objective: Filed Vitals:   03/03/12 0300 03/03/12 0406 03/03/12 0550 03/03/12 0558  BP: 151/65 129/69 127/62   Pulse:  96 59   Temp:  99.6 F (37.6 C) 98 F (36.7 C) 99.9 F (37.7 C)  TempSrc:  Oral Axillary Rectal  Resp:  22 20   Weight:   72 kg (158 lb 11.7 oz)   SpO2:  97% 99%    No intake or output data in the 24 hours ending 03/03/12 1031  Exam:   General:  Pt is not in acute  distress  Cardiovascular: Regular rate and rhythm, S1/S2, no murmurs, no rubs, no gallops  Respiratory: Clear to auscultation bilaterally, no wheezing, no crackles, no rhonchi  Abdomen: Soft, non tender, non distended, bowel sounds present, no guarding  Extremities: Pulses DP and PT palpable bilaterally  Neuro: Grossly nonfocal  Data Reviewed: Basic Metabolic Panel:  Lab 03/02/12 6578  NA 136  K 4.5  CL 99  CO2 22  GLUCOSE 294*  BUN 39*  CREATININE 0.64  CALCIUM 10.0   CBC:  Lab 03/02/12 2100  WBC 9.9  NEUTROABS 5.2  HGB 14.0  HCT 44.4  MCV 93.1  PLT 296   CBG:  Lab 03/02/12 2234 03/02/12 1931  GLUCAP 210* 331*    No results found for this or any previous visit (from the past 240 hour(s)).   Studies: Ct Abdomen Pelvis Wo Contrast 03/03/2012  * IMPRESSION:  1.  Rectum mildly distended with stool, measuring 7.3 cm in diameter, with mild chronic wall thickening. This is grossly stable from the prior CT. 2.  Mild soft tissue edema along the lateral abdominal wall and proximal thighs bilaterally, stable in appearance.  3.  Mild scattered diverticulosis about the hepatic flexure of the colon. 4.  Mild scattered calcification along the distal abdominal aorta and its branches. 5.  Diffuse fatty infiltration within the liver, with more prominent areas of significant heterogeneity, possibly reflecting nutmeg liver due to underlying venous congestion.  No focal masses seen. 6.  Bibasilar atelectasis noted.   Original Report Authenticated By:  Tonia Ghent, M.D.    Dg Chest 2 View 03/02/2012    IMPRESSION: Stable borderline heart size.  No acute cardiopulmonary disease.   Original Report Authenticated By: Hulan Saas, M.D.     Scheduled Meds:   . atorvastatin  40 mg Oral QHS  . clonazePAM  2 mg Oral BID  . dextrose      . famotidine  40 mg Oral QHS  . heparin  5,000 Units Subcutaneous Q8H  . insulin aspart  0-20 Units Subcutaneous TID WC  . insulin glargine  55 Units  Subcutaneous BID  . PARoxetine  40 mg Oral Daily  . pregabalin  150 mg Oral BID  . senna-docusate  1 tablet Oral Daily   Continuous Infusions:   . sodium chloride    . feeding supplement (JEVITY 1.2 CAL)

## 2012-03-03 NOTE — ED Provider Notes (Signed)
Medical screening examination/treatment/procedure(s) were conducted as a shared visit with non-physician practitioner(s) and myself.  I personally evaluated the patient during the encounter  Derwood Kaplan, MD 03/03/12 1536

## 2012-03-03 NOTE — ED Notes (Signed)
Lab was called to draw culture sets

## 2012-03-03 NOTE — H&P (Signed)
[facility] History and Physical  [name] ZOX:[id number] DOB: 1940-09-30 DOA: 03/02/2012  Referring physician: ED PCP: No primary provider on file.  Specialists: None  Chief Complaint: Fever  HPI: [name] is a 72 y.o. female who is brought in by family with symptoms of fever and sweating.  Her temperature at home was 100.3.  The patient has mental retardation, is bedridden at baseline, takes meds through a PEG tube.  The patient other than fever and sweating has no complaints (see ROS which is all negative).  In the ED, patient was found to have some abdominal tenderness to palpation, but CT scan just showed constipation, CXR negative, UA negative, ED started rocephin and vancomycin and asked medicine to admit.  Review of Systems: Patient denies: chest pain, nausea, vomiting, diarrhea, cough, SOB, runny or stuffy nose, headache, back pain, 12 systems reviewed and otherwise negative.  Past Medical History  Diagnosis Date  . Diabetes mellitus   . Renal disorder   . CP (cerebral palsy)   . Renal cancer     s/p nephrectomy  . Parkinsonism   . Dysphagia   . Recurrent aspiration pneumonia   . Bladder spasms    Past Surgical History  Procedure Date  . Hand amputation through wrist   . Abdominal surgery   . Knee surgery    Social History:  does not have a smoking history on file. She does not have any smokeless tobacco history on file. Her alcohol and drug histories not on file. Patient lives at home and is cared for by her family, she is currently at baseline mental status.  Allergies  Allergen Reactions  . Codeine Nausea And Vomiting  . Morphine And Related Nausea And Vomiting    No family history on file. No one else in family has been ill recently.  Prior to Admission medications   Medication Sig Start Date End Date Taking? Authorizing Provider  atorvastatin (LIPITOR) 40 MG tablet Take 40 mg by mouth at bedtime.    Historical Provider, MD    clonazePAM (KLONOPIN) 1 MG tablet Take 2 mg by mouth 2 (two) times daily.    Historical Provider, MD  insulin glargine (LANTUS) 100 UNIT/ML injection Inject 50 Units into the skin 2 (two) times daily.    Historical Provider, MD  metFORMIN (GLUCOPHAGE) 1000 MG tablet Take 1,000 mg by mouth 2 (two) times daily with a meal.    Historical Provider, MD  PARoxetine (PAXIL) 40 MG tablet Take 40 mg by mouth daily.    Historical Provider, MD  pregabalin (LYRICA) 150 MG capsule Take 150 mg by mouth 2 (two) times daily.    Historical Provider, MD  ranitidine (ZANTAC) 300 MG tablet Take 300 mg by mouth at bedtime.    Historical Provider, MD  senna-docusate (SENOKOT-S) 8.6-50 MG per tablet Take 1 tablet by mouth daily. 10/28/11   Dorthula Matas, PA   Physical Exam: Filed Vitals:   03/03/12 0141 03/03/12 0200 03/03/12 0300 03/03/12 0406  BP:  143/63 151/65 129/69  Pulse:    96  Temp: 100.7 F (38.2 C)   99.6 F (37.6 C)  TempSrc: Rectal   Oral  Resp:    22  SpO2:    97%    General:  NAD, resting comfortably in bed Eyes: PEERLA EOMI ENT: mucous membranes moist Neck: supple w/o JVD Cardiovascular: RRR w/o MRG Respiratory: CTA B Abdomen: soft, mild diffuse tenderness, nd, bs+ Skin: no rash nor lesion, no decubitus, peg site  not erythematous Musculoskeletal: MAE, full ROM all 4 extremities Psychiatric: normal tone and affect Neurologic: AAOx3, slow to respond but likely baseline, grossly non-focal  Labs on Admission:  Basic Metabolic Panel:  Lab 03/02/12 1610  NA 136  K 4.5  CL 99  CO2 22  GLUCOSE 294*  BUN 39*  CREATININE 0.64  CALCIUM 10.0  MG --  PHOS --   Liver Function Tests: No results found for this basename: AST:5,ALT:5,ALKPHOS:5,BILITOT:5,PROT:5,ALBUMIN:5 in the last 168 hours No results found for this basename: LIPASE:5,AMYLASE:5 in the last 168 hours No results found for this basename: AMMONIA:5 in the last 168 hours CBC:  Lab 03/02/12 2100  WBC 9.9  NEUTROABS 5.2   HGB 14.0  HCT 44.4  MCV 93.1  PLT 296   Cardiac Enzymes: No results found for this basename: CKTOTAL:5,CKMB:5,CKMBINDEX:5,TROPONINI:5 in the last 168 hours  BNP (last 3 results) No results found for this basename: PROBNP:3 in the last 8760 hours CBG:  Lab 03/02/12 2234 03/02/12 1931  GLUCAP 210* 331*    Radiological Exams on Admission: Ct Abdomen Pelvis Wo Contrast  03/03/2012  *RADIOLOGY REPORT*  Clinical Data: Fever and bilateral lower quadrant tenderness.  CT ABDOMEN AND PELVIS WITHOUT CONTRAST  Technique:  Multidetector CT imaging of the abdomen and pelvis was performed following the standard protocol without intravenous contrast.  Comparison: CT of the abdomen and pelvis performed 10/28/2011  Findings: Bibasilar atelectasis is noted.  There is diffuse fatty infiltration within the liver, with areas of significant heterogeneity; this may reflect nutmeg liver, due to underlying venous congestion.  No focal masses are seen.  The gallbladder is within normal limits.  The pancreas and adrenal glands are unremarkable.  Nonspecific perinephric stranding is noted at the right kidney. The right kidney is otherwise unremarkable in appearance.  There is no evidence of hydronephrosis.  No renal or ureteral stones are seen.  The patient is status post left-sided nephrectomy.  No free fluid is identified.  The small bowel is unremarkable in appearance.  The stomach is within normal limits; the patient's G- tube is noted at the antrum of the stomach.  No acute vascular abnormalities are seen.  Mild scattered calcification is noted along the distal abdominal aorta and its branches.  The appendix is not definitely characterized; there is no evidence for appendicitis.  Mild scattered diverticulosis is noted about the hepatic flexure of the colon.  The colon is partially filled with stool; the rectum remains mildly distended with stool, measuring 7.3 cm in diameter.  Mild associated chronic wall thickening is  seen.  The bladder is mildly distended and grossly unremarkable.  The patient is status post hysterectomy; no suspicious adnexal masses are seen.  The left ovary is grossly unremarkable in appearance. No inguinal lymphadenopathy is seen.  Mild soft tissue edema is noted along the lateral abdominal wall and proximal thighs bilaterally, stable in appearance.  No acute osseous abnormalities are identified.  IMPRESSION:  1.  Rectum mildly distended with stool, measuring 7.3 cm in diameter, with mild chronic wall thickening. This is grossly stable from the prior CT. 2.  Mild soft tissue edema along the lateral abdominal wall and proximal thighs bilaterally, stable in appearance.  3.  Mild scattered diverticulosis about the hepatic flexure of the colon. 4.  Mild scattered calcification along the distal abdominal aorta and its branches. 5.  Diffuse fatty infiltration within the liver, with more prominent areas of significant heterogeneity, possibly reflecting nutmeg liver due to underlying venous congestion.  No focal  masses seen. 6.  Bibasilar atelectasis noted.   Original Report Authenticated By: Tonia Ghent, M.D.    Dg Chest 2 View  03/02/2012  *RADIOLOGY REPORT*  Clinical Data: Shortness of breath.  Unresponsive patient.  CHEST - 2 VIEW  Comparison: Two-view chest x-ray 03/15/2011, 03/31/2010.  Findings: Cardiac silhouette upper normal in size to perhaps slightly enlarged for the AP semi-erect technique, unchanged. Hilar and mediastinal contours unremarkable.  Lungs clear. Bronchovascular markings normal.  Pulmonary vascularity normal.  No pneumothorax.  No pleural effusions.  Stable chronic elevation of the right hemidiaphragm.  Slight thoracic scoliosis convex left may be in part positional.  IMPRESSION: Stable borderline heart size.  No acute cardiopulmonary disease.   Original Report Authenticated By: Hulan Saas, M.D.     EKG: Independently reviewed.  Assessment/Plan Active Problems:   Constipation  Fever  Dehydration   1. Fever - admitting patient but source unclear, holding off on further ABx at this point in time, have ordered blood cultures to be drawn although this is after ABx treatment in ED so may be false negative at this point.  Abd pain the only localizing symptom I could find on exam, CT abd/pelvis was remarkable only for constipation really and no obvious infectious source.  Not really having influenza symptoms. 2. Dehydration - Rehydration with NS, patient's tachycardia improved with fluids, continue NS at 75cc/hr for now 3. Constipation - rectum distended, but apparently this is chronic and no worse than on prior CT scan. 4. DM 2 - continuing home lantus (55 units BID confirmed on phone with husband), will add med dose SSI for now as well, holding metformin. 5. H/o dysphagia - will consult dietary to start tube feeds on patient via Peg tube which is what she gets at home, she is not supposed to be on any POs at home per husband    Code Status: Full Code (must indicate code status--if unknown or must be presumed, indicate so) Family Communication: spoke with husband on phone number in chart (indicate person spoken with, if applicable, with phone number if by telephone) Disposition Plan: Admit to inpatient (indicate anticipated LOS)  Time spent: 70 min  Lianni Kanaan M. [facility] Pager [phone]  If 7PM-7AM, please contact night-coverage [email or web address] Password [facility] 03/03/2012, 5:17 AM

## 2012-03-04 DIAGNOSIS — E119 Type 2 diabetes mellitus without complications: Secondary | ICD-10-CM

## 2012-03-04 DIAGNOSIS — F341 Dysthymic disorder: Secondary | ICD-10-CM

## 2012-03-04 LAB — BASIC METABOLIC PANEL
BUN: 27 mg/dL — ABNORMAL HIGH (ref 6–23)
CO2: 24 mEq/L (ref 19–32)
Calcium: 8.8 mg/dL (ref 8.4–10.5)
Chloride: 101 mEq/L (ref 96–112)
Creatinine, Ser: 0.53 mg/dL (ref 0.50–1.10)
Glucose, Bld: 292 mg/dL — ABNORMAL HIGH (ref 70–99)

## 2012-03-04 LAB — URINE CULTURE: Colony Count: >=100000

## 2012-03-04 LAB — CBC
HCT: 37 % (ref 36.0–46.0)
Hemoglobin: 11.5 g/dL — ABNORMAL LOW (ref 12.0–15.0)
MCH: 28.9 pg (ref 26.0–34.0)
MCV: 93 fL (ref 78.0–100.0)
RBC: 3.98 MIL/uL (ref 3.87–5.11)
WBC: 6.6 10*3/uL (ref 4.0–10.5)

## 2012-03-04 MED ORDER — POLYETHYLENE GLYCOL 3350 17 G PO PACK
17.0000 g | PACK | Freq: Three times a day (TID) | ORAL | Status: DC
  Administered 2012-03-04 – 2012-03-05 (×5): 17 g via ORAL
  Filled 2012-03-04 (×8): qty 1

## 2012-03-04 MED ORDER — BISACODYL 10 MG RE SUPP: 10.0000 mg | Freq: Every day | RECTAL | Status: DC | PRN

## 2012-03-04 NOTE — Progress Notes (Signed)
[facility] PROGRESS NOTE  [name] ZOX:[id number] DOB: 10/23/1940 DOA: 03/02/2012 PCP: No primary provider on file.  Brief narrative: 72 year old female with past medical history of anxiety and depression, mental retardation, bed bound, DM and dyslipidemia who presented to ED 03/03/2012 for fever and sweating.  In the ED, patient was found to have some abdominal tenderness to palpation, but CT scan just showed constipation, CXR negative, UA negative. Vancomycin and rocephin started empirically in ED. We have discontinue vancomycin and left rocephin for possible diverticulitis.  Assessment/Plan:   Principal Problem:  *Fever  Possible GI etiology, diverticulitis  No UTI or pneumonia obvious on UA or CXR respectively  D/C vanco and continue rocephin  Blood cultures not obtained on admission; we will collect blood cultures if pt spikes a fever Active Problems:  Constipation  No reports of BM; patient sleeping Added colace, miralax Anxiety and depression  Continue clonazepam, paxil and lyrica Dyslipidemia  Continue atorvastatin Diabetes mellitus  glargine 55 units BID  Sliding scale   Code Status: full code  Family Communication: no family at bedside  Disposition Plan: home when stable; needs PT eval prior to discharge   Manson Passey, MD  [facility]  Pager [phone]   Consultants:  None  Procedures:  None  Antibiotics:  Rocephin 03/03/2012 -->  If 7PM-7AM, please contact night-coverage [email or web address] Password [facility] 03/04/2012, 11:36 AM   LOS: 70 days   HPI/Subjective: No acute overnight events.  Objective: Filed Vitals:   03/03/12 2131 03/03/12 2211 03/04/12 0553 03/04/12 0726  BP: 125/53  149/60   Pulse:  100 93   Temp: 99.5 F (37.5 C)  97.2 F (36.2 C)   TempSrc: Oral  Axillary   Resp: 18  20   Height:      Weight:    75.342 kg (166 lb 1.6 oz)  SpO2:  98% 97%     Intake/Output Summary (Last 24 hours) at 03/04/12 1136 Last data filed at 03/04/12  0600  Gross per 24 hour  Intake   1190 ml  Output      0 ml  Net   1190 ml    Exam:   General:  Pt is sleeping,  not in acute distress  Cardiovascular: Regular rate and rhythm, S1/S2, no murmurs, no rubs, no gallops  Respiratory: Clear to auscultation bilaterally, no wheezing, no crackles, no rhonchi  Abdomen: Soft, non tender, non distended, bowel sounds present, no guarding  Extremities: No edema, pulses DP and PT palpable bilaterally  Neuro: Grossly nonfocal  Data Reviewed: Basic Metabolic Panel:  Lab 03/04/12 8295 03/02/12 2100  NA 137 136  K 3.6 4.5  CL 101 99  CO2 24 22  GLUCOSE 292* 294*  BUN 27* 39*  CREATININE 0.53 0.64  CALCIUM 8.8 10.0  MG -- --  PHOS -- --   CBC:  Lab 03/04/12 0410 03/02/12 2100  WBC 6.6 9.9  NEUTROABS -- 5.2  HGB 11.5* 14.0  HCT 37.0 44.4  MCV 93.0 93.1  PLT 251 296   CBG:  Lab 03/04/12 0805 03/03/12 1751 03/03/12 1220 03/03/12 1016 03/03/12 0858  GLUCAP 301* 234* 119* 112* 69*    Recent Results (from the past 240 hour(s))  MRSA PCR SCREENING     Status: Normal   Collection Time   03/03/12  8:00 AM      Component Value Range Status Comment   MRSA by PCR NEGATIVE  NEGATIVE Final      Studies: Ct Abdomen Pelvis Wo Contrast  03/03/2012  *RADIOLOGY REPORT*  Clinical Data: Fever and bilateral lower quadrant tenderness.  CT ABDOMEN AND PELVIS WITHOUT CONTRAST  Technique:  Multidetector CT imaging of the abdomen and pelvis was performed following the standard protocol without intravenous contrast.  Comparison: CT of the abdomen and pelvis performed 10/28/2011  Findings: Bibasilar atelectasis is noted.  There is diffuse fatty infiltration within the liver, with areas of significant heterogeneity; this may reflect nutmeg liver, due to underlying venous congestion.  No focal masses are seen.  The gallbladder is within normal limits.  The pancreas and adrenal glands are unremarkable.  Nonspecific perinephric stranding is noted at the  right kidney. The right kidney is otherwise unremarkable in appearance.  There is no evidence of hydronephrosis.  No renal or ureteral stones are seen.  The patient is status post left-sided nephrectomy.  No free fluid is identified.  The small bowel is unremarkable in appearance.  The stomach is within normal limits; the patient's G- tube is noted at the antrum of the stomach.  No acute vascular abnormalities are seen.  Mild scattered calcification is noted along the distal abdominal aorta and its branches.  The appendix is not definitely characterized; there is no evidence for appendicitis.  Mild scattered diverticulosis is noted about the hepatic flexure of the colon.  The colon is partially filled with stool; the rectum remains mildly distended with stool, measuring 7.3 cm in diameter.  Mild associated chronic wall thickening is seen.  The bladder is mildly distended and grossly unremarkable.  The patient is status post hysterectomy; no suspicious adnexal masses are seen.  The left ovary is grossly unremarkable in appearance. No inguinal lymphadenopathy is seen.  Mild soft tissue edema is noted along the lateral abdominal wall and proximal thighs bilaterally, stable in appearance.  No acute osseous abnormalities are identified.  IMPRESSION:  1.  Rectum mildly distended with stool, measuring 7.3 cm in diameter, with mild chronic wall thickening. This is grossly stable from the prior CT. 2.  Mild soft tissue edema along the lateral abdominal wall and proximal thighs bilaterally, stable in appearance.  3.  Mild scattered diverticulosis about the hepatic flexure of the colon. 4.  Mild scattered calcification along the distal abdominal aorta and its branches. 5.  Diffuse fatty infiltration within the liver, with more prominent areas of significant heterogeneity, possibly reflecting nutmeg liver due to underlying venous congestion.  No focal masses seen. 6.  Bibasilar atelectasis noted.   Original Report Authenticated  By: Tonia Ghent, M.D.    Dg Chest 2 View  03/02/2012  *RADIOLOGY REPORT*  Clinical Data: Shortness of breath.  Unresponsive patient.  CHEST - 2 VIEW  Comparison: Two-view chest x-ray 03/15/2011, 03/31/2010.  Findings: Cardiac silhouette upper normal in size to perhaps slightly enlarged for the AP semi-erect technique, unchanged. Hilar and mediastinal contours unremarkable.  Lungs clear. Bronchovascular markings normal.  Pulmonary vascularity normal.  No pneumothorax.  No pleural effusions.  Stable chronic elevation of the right hemidiaphragm.  Slight thoracic scoliosis convex left may be in part positional.  IMPRESSION: Stable borderline heart size.  No acute cardiopulmonary disease.   Original Report Authenticated By: Hulan Saas, M.D.     Scheduled Meds:   . atorvastatin  40 mg Oral QHS  . cefTRIAXone (ROCEPHIN)  IV  1 g Intravenous Q24H  . clonazePAM  2 mg Oral BID  . famotidine  40 mg Oral QHS  . heparin  5,000 Units Subcutaneous Q8H  . insulin aspart  0-20 Units Subcutaneous TID  WC  . insulin glargine  55 Units Subcutaneous BID  . PARoxetine  40 mg Oral Daily  . pregabalin  150 mg Oral BID  . senna-docusate  1 tablet Oral Daily   Continuous Infusions:   . sodium chloride 75 mL/hr at 03/04/12 0945  . feeding supplement (JEVITY 1.2 CAL) 1,000 mL (03/03/12 2320)

## 2012-03-04 NOTE — Progress Notes (Signed)
INITIAL NUTRITION ASSESSMENT  DOCUMENTATION CODES Per approved criteria  -Not Applicable   INTERVENTION: Continue TF per home regime with Jevity 1.2 at 55 ml/hr continuously via PEG.  Will discontinue clear liquid diet as husband reported patient is to take nothing by mouth.  NUTRITION DIAGNOSIS: Inadequate oral intake related to swallowing as evidenced by npo status prior to admit.  Goal: TF to meet 100% estimated needs.  Monitor:  TF tolerance, labs, weight trend, I/O  Reason for Assessment: Home TF, MST  72 y.o. female  Admitting Dx: Fever   ASSESSMENT:  Past medical history of anxiety and depression, mental retardation, bed bound, DM and dyslipidemia.  RD called husband 1/25, Ronald Pippins at home [phone]), to discuss home enteral nutrition regimen. Per husband, pt receives Jevity 1.2 at 55 ml/hr continuously. This regimen provides 1584 kcal, 74 grams protein, 223 grams carbohydrate, and 1065 ml free water. Husband reports that pt has been tolerating TF regimen well and weight has been stable. Asked husband if pt receives free water flushes and he states that pt receives water when he administers medications; RD unable to determine if pt is receiving free water flushes at home. Also, this RD called husband today who stated that he was told that patient should not have anything by mouth but he gives her a little tea sometimes.  Height: Ht Readings from Last 1 Encounters:  03/03/12 5\' 7"  (1.702 m)    Weight: Wt Readings from Last 1 Encounters:  03/04/12 166 lb 1.6 oz (75.342 kg)    Ideal Body Weight: 135 lbs  % Ideal Body Weight: 123  Wt Readings from Last 10 Encounters:  03/04/12 166 lb 1.6 oz (75.342 kg)    Usual Body Weight: unknown   BMI:  Body mass index is 26.01 kg/(m^2).  Estimated Nutritional Needs: Kcal: 1500-1600 Protein: 70-80 gm Fluid: 1.5-1.6L daily   Diet Order: Clear Liquid  EDUCATION NEEDS: -No education needs identified at this  time   Intake/Output Summary (Last 24 hours) at 03/04/12 1302 Last data filed at 03/04/12 0600  Gross per 24 hour  Intake   1130 ml  Output      0 ml  Net   1130 ml    Last BM: unknown  Labs:   Lab 03/04/12 0410 03/02/12 2100  NA 137 136  K 3.6 4.5  CL 101 99  CO2 24 22  BUN 27* 39*  CREATININE 0.53 0.64  CALCIUM 8.8 10.0  MG -- --  PHOS -- --  GLUCOSE 292* 294*    CBG (last 3)   Basename 03/04/12 0805 03/03/12 1751 03/03/12 1220  GLUCAP 301* 234* 119*    Scheduled Meds:   . atorvastatin  40 mg Oral QHS  . cefTRIAXone (ROCEPHIN)  IV  1 g Intravenous Q24H  . clonazePAM  2 mg Oral BID  . famotidine  40 mg Oral QHS  . insulin aspart  0-20 Units Subcutaneous TID WC  . insulin glargine  55 Units Subcutaneous BID  . PARoxetine  40 mg Oral Daily  . polyethylene glycol  17 g Oral TID PC & HS  . pregabalin  150 mg Oral BID  . senna-docusate  1 tablet Oral Daily    Continuous Infusions:   . sodium chloride 75 mL/hr at 03/04/12 0945  . feeding supplement (JEVITY 1.2 CAL) 1,000 mL (03/03/12 2320)    Past Medical History  Diagnosis Date  . Diabetes mellitus   . Renal disorder   . CP (cerebral palsy)   .  Renal cancer     s/p nephrectomy  . Parkinsonism   . Dysphagia   . Recurrent aspiration pneumonia   . Bladder spasms     Past Surgical History  Procedure Date  . Hand amputation through wrist   . Abdominal surgery   . Knee surgery   . Nephrectomy     Oran Rein, RD, LDN Clinical Inpatient Dietitian Pager:  [phone] Weekend and after hours pager:  [phone]

## 2012-03-05 LAB — CBC
Hemoglobin: 11.1 g/dL — ABNORMAL LOW (ref 12.0–15.0)
MCH: 29.1 pg (ref 26.0–34.0)
MCV: 90.8 fL (ref 78.0–100.0)
Platelets: 211 10*3/uL (ref 150–400)
RBC: 3.82 MIL/uL — ABNORMAL LOW (ref 3.87–5.11)
WBC: 5.9 10*3/uL (ref 4.0–10.5)

## 2012-03-05 MED ORDER — NITROFURANTOIN MONOHYD MACRO 100 MG PO CAPS: 100.0000 mg | ORAL_CAPSULE | Freq: Two times a day (BID) | ORAL | Status: DC

## 2012-03-05 MED ORDER — LORAZEPAM 2 MG/ML IJ SOLN: 1.0000 mg | Freq: Four times a day (QID) | INTRAMUSCULAR | Status: DC | PRN

## 2012-03-05 MED ORDER — GLUCERNA 1.2 CAL PO LIQD
1000.0000 mL | ORAL | Status: DC
  Administered 2012-03-05: 1000 mL
  Filled 2012-03-05 (×2): qty 1000

## 2012-03-05 MED ORDER — FREE WATER
30.0000 mL | Freq: Four times a day (QID) | Status: DC
  Administered 2012-03-05 (×2): 30 mL

## 2012-03-05 NOTE — Progress Notes (Signed)
Patient discharged to home via stretcher by [facility]. Previous caregiver states discharge instructions reviewed and given to patient's spouse.

## 2012-03-05 NOTE — Progress Notes (Signed)
[facility] PROGRESS NOTE  [name] QIO:[id number] DOB: 1940-04-28 DOA: 03/02/2012 PCP: No primary provider on file.  Brief narrative: 72 year old female with past medical history of anxiety and depression, mental retardation, bed bound, DM and dyslipidemia who presented to ED 03/03/2012 for fever and sweating.  In the ED, patient was found to have some abdominal tenderness to palpation, but CT scan just showed constipation, CXR negative. Urine culture grew lactobacillus species. Patient was started on Rocephin since admission.  Assessment/Plan:  Principal Problem:  *Fever  Secondary to urinary tract infection Urine culture positive for Lactobacillus species. Followup sensitivity report Continue Rocephin  Active Problems:  Constipation  1 bowel movement yesterday Continue colace, miralax Anxiety and depression  Continue clonazepam, paxil and lyrica Dyslipidemia  Continue atorvastatin Diabetes mellitus  glargine 55 units BID  Sliding scale   Code Status: full code  Family Communication: no family at bedside  Disposition Plan: home when stable, likely in am  Manson Passey, MD  [facility]  Pager [phone]   Consultants:  None  Procedures:  None  Antibiotics:  Rocephin 03/03/2012 -->  If 7PM-7AM, please contact night-coverage [email or web address] Password [facility] 03/05/2012, 12:33 PM   LOS: 3 days   HPI/Subjective: No acute overnight events.  Objective: Filed Vitals:   03/04/12 0726 03/04/12 1455 03/04/12 2242 03/05/12 0610  BP:  147/71 113/45 147/72  Pulse:  87 94 87  Temp:  97.9 F (36.6 C) 97.3 F (36.3 C) 98.6 F (37 C)  TempSrc:  Oral Oral Oral  Resp:  17 16 18   Height:      Weight: 75.342 kg (166 lb 1.6 oz)   78.336 kg (172 lb 11.2 oz)  SpO2:  99% 96% 98%    Intake/Output Summary (Last 24 hours) at 03/05/12 1233 Last data filed at 03/05/12 0700  Gross per 24 hour  Intake   2345 ml  Output      0 ml  Net   2345 ml    Exam:   General:  Pt is   not in acute distress  Cardiovascular: Regular rate and rhythm, S1/S2, no murmurs, no rubs, no gallops  Respiratory: Clear to auscultation bilaterally, no wheezing, no crackles, no rhonchi  Abdomen: Soft, non tender, non distended, bowel sounds present, no guarding  Extremities: No edema, pulses DP and PT palpable bilaterally  Neuro: Grossly nonfocal  Data Reviewed: Basic Metabolic Panel:  Lab 03/04/12 2725 03/02/12 2100  NA 137 136  K 3.6 4.5  CL 101 99  CO2 24 22  GLUCOSE 292* 294*  BUN 27* 39*  CREATININE 0.53 0.64  CALCIUM 8.8 10.0   CBC:  Lab 03/05/12 0403 03/04/12 0410 03/02/12 2100  WBC 5.9 6.6 9.9  NEUTROABS -- -- 5.2  HGB 11.1* 11.5* 14.0  HCT 34.7* 37.0 44.4  MCV 90.8 93.0 93.1  PLT 211 251 296   CBG:  Lab 03/04/12 0805 03/03/12 1751 03/03/12 1220 03/03/12 1016 03/03/12 0858  GLUCAP 301* 234* 119* 112* 69*    URINE CULTURE     Status: Normal   Collection Time   03/02/12  9:09 PM      Component Value Range Status Comment   Specimen Description URINE, CATH  Final    Special Requests NONE   Final    Colony Count >=100,000 COLONIES/ML  Final    Culture     Final    Value: LACTOBACILLUS SPECIES     Note: Standardized susceptibility testing for this organism is not available.  Report Status 03/04/2012 FINAL   Final   MRSA PCR SCREENING     Status: Normal   Collection Time   03/03/12  8:00 AM      Component Value Range Status Comment   MRSA by PCR NEGATIVE  NEGATIVE Final   CULTURE, BLOOD (ROUTINE X 2)     Status: Normal (Preliminary result)   Collection Time   03/03/12  9:57 PM      Component Value Range Status Comment   Culture     Final    Value:        BLOOD CULTURE RECEIVED NO GROWTH TO DATE   Report Status PENDING   Incomplete   CULTURE, BLOOD (ROUTINE X 2)     Status: Normal (Preliminary result)   Collection Time   03/03/12 10:05 PM      Component Value Range Status Comment   Culture     Final    Value:        BLOOD CULTURE RECEIVED NO  GROWTH TO DATE    Report Status PENDING   Incomplete      Studies: No results found.  Scheduled Meds:   . atorvastatin  40 mg Oral QHS  . cefTRIAXone  1 g Intravenous Q24H  . clonazePAM  2 mg Oral BID  . famotidine  40 mg Oral QHS  . free water  30 mL Per Tube Q6H  . insulin aspart  0-20 Units Subcutaneous TID WC  . insulin glargine  55 Units Subcutaneous BID  . PARoxetine  40 mg Oral Daily  . polyethylene glycol  17 g Oral TID PC & HS  . pregabalin  150 mg Oral BID  . senna-docusate  1 tablet Oral Daily   Continuous Infusions:   . sodium chloride 75 mL/hr at 03/04/12 2244  . feeding supplement (GLUCERNA 1.2 CAL) 1,000 mL (03/05/12 1130)

## 2012-03-05 NOTE — Progress Notes (Signed)
Nutrition Brief Note  Intervention: - Changed TF to Glucerna 1.2 at 55ml/hr via PEG  - If IVF d/c, recommend water flushes q6hr via PEG - Will continue to monitor   Diet: NPO  TF: Jevity 1.5 at 59ml/hr via PEG which provides 1584 kcal, 74 grams protein, 223 grams carbohydrate, and 1065 ml free water. This provides 106% estimated calorie needs, 106% estimated protein needs.  CBG (last 3)   Basename 03/04/12 0805 03/03/12 1751 03/03/12 1220  GLUCAP 301* 234* 119*    - Noted pt continues to have elevated blood sugars. Discussed with RN and MD and obtained verbal order to switch TF to Glucerna 1.2 at 17ml/hr to help with blood sugar control. This regimen will provide 1584 kcal, 79 grams protein, 151 grams carbohydrate, and 1063 ml free water, meeting 106% estimated calorie needs and 113% estimated protein needs. No TF residuals per RN. No family present, RD will continue to monitor TF tolerance and labs and communicate with husband the need for PEG tube water flushes at discharge.   Levon Hedger MS, RD, LDN [phone] Pager [phone] After Hours Pager

## 2012-03-05 NOTE — Progress Notes (Signed)
PT Cancellation Note  Patient Details Name: [name] MRN: [id number] DOB: 05-27-1940   Cancelled Treatment:    Reason Eval/Treat Not Completed: Other (comment);Fatigue/lethargy limiting ability to participate (dicussed with D. Elisabeth Pigeon who wil check with husband WU:[id number])  Husband has been caring for pt at home.  He may benefit from mechanical lift or HHPT depending on what he has at home or has had in the past.  Dr. Elisabeth Pigeon will re-notify us if husband needs continued education  Donnetta Hail 03/05/2012, 1:59 PM

## 2012-03-05 NOTE — Discharge Summary (Signed)
Physician Discharge Summary  [name] WJX:[id number] DOB: 1940/06/09 DOA: 03/02/2012  PCP: No primary provider on file.  Admit date: 03/02/2012 Discharge date: 03/05/2012  Recommendations for Outpatient Follow-up:  1. Follow up with PCP in 1-2 weeks post discharge or sooner if symptoms persist  Discharge Diagnoses:  Principal Problem:  *Fever Active Problems:  Constipation  Anxiety and depression  Dyslipidemia  Diabetes mellitus    Discharge Condition: medically stable and clinically appears well for discharge home today  Diet recommendation: as tolerated  History of present illness:  72 year old female with past medical history of anxiety and depression, mental retardation, bed bound, DM and dyslipidemia who presented to ED 03/03/2012 for fever and sweating.  In the ED, patient was found to have some abdominal tenderness to palpation, but CT scan just showed constipation, CXR negative. Urine culture grew lactobacillus species. Patient was started on Rocephin since admission.   Assessment/Plan:   Principal Problem:  *Fever  Secondary to urinary tract infection  Urine culture positive for Lactobacillus species.  Continue Macrobid for 5 days on discharge Active Problems:  Constipation  1 bowel movement yesterday  Continue colace, miralax Anxiety and depression  Continue clonazepam, paxil and lyrica Dyslipidemia  Continue atorvastatin Diabetes mellitus  glargine 55 units BID  Sliding scale   Code Status: full code  Family Communication: no family at bedside  Disposition Plan: home today  Manson Passey, MD  [facility]  Pager [phone]   Consultants:  None  Procedures:  None  Antibiotics:  Rocephin 03/03/2012 -->03/05/2012 macrobid for 5 days on discharge  Discharge Exam: Filed Vitals:   03/05/12 1500  BP: 135/55  Pulse: 84  Temp: 98 F (36.7 C)  Resp: 16   Filed Vitals:   03/04/12 1455 03/04/12 2242 03/05/12 0610 03/05/12 1500  BP: 147/71 113/45  147/72 135/55  Pulse: 87 94 87 84  Temp: 97.9 F (36.6 C) 97.3 F (36.3 C) 98.6 F (37 C) 98 F (36.7 C)  TempSrc: Oral Oral Oral   Resp: 17 16 18 16   Height:      Weight:   78.336 kg (172 lb 11.2 oz)   SpO2: 99% 96% 98% 98%    General: Pt is alert, follows commands appropriately, not in acute distress Cardiovascular: Regular rate and rhythm, S1/S2 +, no murmurs, no rubs, no gallops Respiratory: Clear to auscultation bilaterally, no wheezing, no crackles, no rhonchi Abdominal: Soft, non tender, non distended, bowel sounds +, no guarding Extremities: no edema, no cyanosis, pulses palpable bilaterally DP and PT Neuro: Grossly nonfocal  Discharge Instructions  Discharge Orders    Future Orders Please Complete By Expires   Diet - low sodium heart healthy      Increase activity slowly      Call MD for:  persistant nausea and vomiting      Call MD for:  severe uncontrolled pain      Call MD for:  difficulty breathing, headache or visual disturbances      Call MD for:  persistant dizziness or light-headedness          Medication List     As of 03/05/2012  4:21 PM    TAKE these medications         clonazePAM 1 MG tablet   Commonly known as: KLONOPIN   1 mg by Gastric Tube route 2 (two) times daily.      diphenoxylate-atropine 2.5-0.025 MG per tablet   Commonly known as: LOMOTIL   1 tablet by Gastric  Tube route 3 (three) times daily as needed. For diarrhea      fenofibrate 145 MG tablet   Commonly known as: TRICOR   Take 145 mg by mouth at bedtime.      insulin detemir 100 UNIT/ML injection   Commonly known as: LEVEMIR   Inject 55 Units into the skin 2 (two) times daily.      metFORMIN 1000 MG tablet   Commonly known as: GLUCOPHAGE   1,000 mg by Gastric Tube route 2 (two) times daily with a meal.      nitrofurantoin (macrocrystal-monohydrate) 100 MG capsule   Commonly known as: MACROBID   Take 1 capsule (100 mg total) by mouth 2 (two) times daily.      PARoxetine  40 MG tablet   Commonly known as: PAXIL   40 mg by Gastric Tube route every morning.      pregabalin 150 MG capsule   Commonly known as: LYRICA   150 mg by Gastric Tube route 2 (two) times daily.      ranitidine 300 MG tablet   Commonly known as: ZANTAC   300 mg by Gastric Tube route at bedtime.          The results of significant diagnostics from this hospitalization (including imaging, microbiology, ancillary and laboratory) are listed below for reference.    Significant Diagnostic Studies: Ct Abdomen Pelvis Wo Contrast  03/03/2012  *RADIOLOGY REPORT*  Clinical Data: Fever and bilateral lower quadrant tenderness.  CT ABDOMEN AND PELVIS WITHOUT CONTRAST  Technique:  Multidetector CT imaging of the abdomen and pelvis was performed following the standard protocol without intravenous contrast.  Comparison: CT of the abdomen and pelvis performed 10/28/2011  Findings: Bibasilar atelectasis is noted.  There is diffuse fatty infiltration within the liver, with areas of significant heterogeneity; this may reflect nutmeg liver, due to underlying venous congestion.  No focal masses are seen.  The gallbladder is within normal limits.  The pancreas and adrenal glands are unremarkable.  Nonspecific perinephric stranding is noted at the right kidney. The right kidney is otherwise unremarkable in appearance.  There is no evidence of hydronephrosis.  No renal or ureteral stones are seen.  The patient is status post left-sided nephrectomy.  No free fluid is identified.  The small bowel is unremarkable in appearance.  The stomach is within normal limits; the patient's G- tube is noted at the antrum of the stomach.  No acute vascular abnormalities are seen.  Mild scattered calcification is noted along the distal abdominal aorta and its branches.  The appendix is not definitely characterized; there is no evidence for appendicitis.  Mild scattered diverticulosis is noted about the hepatic flexure of the colon.  The  colon is partially filled with stool; the rectum remains mildly distended with stool, measuring 7.3 cm in diameter.  Mild associated chronic wall thickening is seen.  The bladder is mildly distended and grossly unremarkable.  The patient is status post hysterectomy; no suspicious adnexal masses are seen.  The left ovary is grossly unremarkable in appearance. No inguinal lymphadenopathy is seen.  Mild soft tissue edema is noted along the lateral abdominal wall and proximal thighs bilaterally, stable in appearance.  No acute osseous abnormalities are identified.  IMPRESSION:  1.  Rectum mildly distended with stool, measuring 7.3 cm in diameter, with mild chronic wall thickening. This is grossly stable from the prior CT. 2.  Mild soft tissue edema along the lateral abdominal wall and proximal thighs bilaterally, stable in appearance.  3.  Mild scattered diverticulosis about the hepatic flexure of the colon. 4.  Mild scattered calcification along the distal abdominal aorta and its branches. 5.  Diffuse fatty infiltration within the liver, with more prominent areas of significant heterogeneity, possibly reflecting nutmeg liver due to underlying venous congestion.  No focal masses seen. 6.  Bibasilar atelectasis noted.   Original Report Authenticated By: Tonia Ghent, M.D.    Dg Chest 2 View  03/02/2012  *RADIOLOGY REPORT*  Clinical Data: Shortness of breath.  Unresponsive patient.  CHEST - 2 VIEW  Comparison: Two-view chest x-ray 03/15/2011, 03/31/2010.  Findings: Cardiac silhouette upper normal in size to perhaps slightly enlarged for the AP semi-erect technique, unchanged. Hilar and mediastinal contours unremarkable.  Lungs clear. Bronchovascular markings normal.  Pulmonary vascularity normal.  No pneumothorax.  No pleural effusions.  Stable chronic elevation of the right hemidiaphragm.  Slight thoracic scoliosis convex left may be in part positional.  IMPRESSION: Stable borderline heart size.  No acute  cardiopulmonary disease.   Original Report Authenticated By: Hulan Saas, M.D.     Microbiology: Recent Results (from the past 240 hour(s))  URINE CULTURE     Status: Normal   Collection Time   03/02/12  9:09 PM      Component Value Range Status Comment   Specimen Description URINE, CATHETERIZED   Final    Special Requests NONE   Final    Culture  Setup Time 03/03/2012 03:47   Final    Colony Count >=100,000 COLONIES/ML   Final    Culture     Final    Value: LACTOBACILLUS SPECIES     Note: Standardized susceptibility testing for this organism is not available.   Report Status 03/04/2012 FINAL   Final   MRSA PCR SCREENING     Status: Normal   Collection Time   03/03/12  8:00 AM      Component Value Range Status Comment   MRSA by PCR NEGATIVE  NEGATIVE Final   CULTURE, BLOOD (ROUTINE X 2)     Status: Normal (Preliminary result)   Collection Time   03/03/12  9:57 PM      Component Value Range Status Comment   Specimen Description BLOOD RIGHT HAND   Final    Special Requests BOTTLES DRAWN AEROBIC ONLY 5CC   Final    Culture  Setup Time 03/04/2012 00:53   Final    Culture     Final    Value:        BLOOD CULTURE RECEIVED NO GROWTH TO DATE CULTURE WILL BE HELD FOR 5 DAYS BEFORE ISSUING A FINAL NEGATIVE REPORT   Report Status PENDING   Incomplete   CULTURE, BLOOD (ROUTINE X 2)     Status: Normal (Preliminary result)   Collection Time   03/03/12 10:05 PM      Component Value Range Status Comment   Specimen Description BLOOD RIGHT FOREARM   Final    Special Requests BOTTLES DRAWN AEROBIC ONLY 5CC   Final    Culture  Setup Time 03/04/2012 00:53   Final    Culture     Final    Value:        BLOOD CULTURE RECEIVED NO GROWTH TO DATE CULTURE WILL BE HELD FOR 5 DAYS BEFORE ISSUING A FINAL NEGATIVE REPORT   Report Status PENDING   Incomplete      Labs: Basic Metabolic Panel:  Lab 03/04/12 2130 03/02/12 2100  NA 137 136  K 3.6 4.5  CL  101 99  CO2 24 22  GLUCOSE 292* 294*  BUN 27*  39*  CREATININE 0.53 0.64  CALCIUM 8.8 10.0  MG -- --  PHOS -- --   Liver Function Tests: No results found for this basename: AST:5,ALT:5,ALKPHOS:5,BILITOT:5,PROT:5,ALBUMIN:5 in the last 168 hours No results found for this basename: LIPASE:5,AMYLASE:5 in the last 168 hours No results found for this basename: AMMONIA:5 in the last 168 hours CBC:  Lab 03/05/12 0403 03/04/12 0410 03/02/12 2100  WBC 5.9 6.6 9.9  NEUTROABS -- -- 5.2  HGB 11.1* 11.5* 14.0  HCT 34.7* 37.0 44.4  MCV 90.8 93.0 93.1  PLT 211 251 296   Cardiac Enzymes: No results found for this basename: CKTOTAL:5,CKMB:5,CKMBINDEX:5,TROPONINI:5 in the last 168 hours BNP: BNP (last 3 results) No results found for this basename: PROBNP:3 in the last 8760 hours CBG:  Lab 03/04/12 0805 03/03/12 1751 03/03/12 1220 03/03/12 1016 03/03/12 0858  GLUCAP 301* 234* 119* 112* 69*    Time coordinating discharge: Over 30 minutes  Signed:  Manson Passey, MD  [facility] 03/05/2012, 4:21 PM  Pager #: [phone]

## 2012-03-07 LAB — GLUCOSE, CAPILLARY: Glucose-Capillary: 214 mg/dL — ABNORMAL HIGH (ref 70–99)

## 2012-03-10 LAB — CULTURE, BLOOD (ROUTINE X 2)
Culture: NO GROWTH
Culture: NO GROWTH

## 2012-03-22 ENCOUNTER — Emergency Department (HOSPITAL_COMMUNITY)
Admission: EM | Admit: 2012-03-22 | Discharge: 2012-03-22 | Disposition: A | Discharge status: Home / Self Care | Payer: 59 | Attending: Emergency Medicine | Admitting: Emergency Medicine

## 2012-03-22 ENCOUNTER — Emergency Department (HOSPITAL_COMMUNITY): Payer: 59

## 2012-03-22 ENCOUNTER — Encounter (HOSPITAL_COMMUNITY): Payer: Self-pay

## 2012-03-22 DIAGNOSIS — Y833 Surgical operation with formation of external stoma as the cause of abnormal reaction of the patient, or of later complication, without mention of misadventure at the time of the procedure: Secondary | ICD-10-CM | POA: Insufficient documentation

## 2012-03-22 DIAGNOSIS — N259 Disorder resulting from impaired renal tubular function, unspecified: Secondary | ICD-10-CM | POA: Insufficient documentation

## 2012-03-22 DIAGNOSIS — E119 Type 2 diabetes mellitus without complications: Secondary | ICD-10-CM | POA: Insufficient documentation

## 2012-03-22 DIAGNOSIS — K9420 Gastrostomy complication, unspecified: Secondary | ICD-10-CM

## 2012-03-22 DIAGNOSIS — G809 Cerebral palsy, unspecified: Secondary | ICD-10-CM | POA: Insufficient documentation

## 2012-03-22 DIAGNOSIS — R1310 Dysphagia, unspecified: Secondary | ICD-10-CM | POA: Insufficient documentation

## 2012-03-22 DIAGNOSIS — G20A1 Parkinson's disease without dyskinesia, without mention of fluctuations: Secondary | ICD-10-CM | POA: Insufficient documentation

## 2012-03-22 DIAGNOSIS — K9429 Other complications of gastrostomy: Secondary | ICD-10-CM | POA: Insufficient documentation

## 2012-03-22 DIAGNOSIS — Z85528 Personal history of other malignant neoplasm of kidney: Secondary | ICD-10-CM | POA: Insufficient documentation

## 2012-03-22 DIAGNOSIS — Z8701 Personal history of pneumonia (recurrent): Secondary | ICD-10-CM | POA: Insufficient documentation

## 2012-03-22 DIAGNOSIS — Z431 Encounter for attention to gastrostomy: Secondary | ICD-10-CM | POA: Insufficient documentation

## 2012-03-22 DIAGNOSIS — Z87448 Personal history of other diseases of urinary system: Secondary | ICD-10-CM | POA: Insufficient documentation

## 2012-03-22 DIAGNOSIS — G20 Parkinson's disease: Secondary | ICD-10-CM | POA: Insufficient documentation

## 2012-03-22 DIAGNOSIS — Z79899 Other long term (current) drug therapy: Secondary | ICD-10-CM | POA: Insufficient documentation

## 2012-03-22 HISTORY — DX: Complete traumatic amputation of unspecified shoulder and upper arm, level unspecified, initial encounter: S48.919A

## 2012-03-22 MED ORDER — IOHEXOL 300 MG/ML  SOLN
50.0000 mL | Freq: Once | INTRAMUSCULAR | Status: AC | PRN
  Administered 2012-03-22: 35 mL

## 2012-03-22 NOTE — ED Notes (Signed)
Bed:WA12<BR> Expected date:<BR> Expected time:<BR> Means of arrival:<BR> Comments:<BR> ems 

## 2012-03-22 NOTE — ED Provider Notes (Signed)
History    71yF brought in by EMS after g-tube was dislodged. Did not bring tube with pt, but per review of notes, previously had 60F tube replaced in ED. Pt with hx of cerebral palsy and dysphagia. Apparently tube was dislodged sometime last night. No report of vomiting.   CSN: [id number]  Arrival date & time 03/22/12  0846   First MD Initiated Contact with Patient 03/22/12 [phone]      Chief Complaint  Patient presents with  . feeding tube came out     (Consider location/radiation/quality/duration/timing/severity/associated sxs/prior treatment) HPI  Past Medical History  Diagnosis Date  . Diabetes mellitus   . Renal disorder   . CP (cerebral palsy)   . Renal cancer     s/p nephrectomy  . Parkinsonism   . Dysphagia   . Recurrent aspiration pneumonia   . Bladder spasms     Past Surgical History  Procedure Laterality Date  . Hand amputation through wrist    . Abdominal surgery    . Knee surgery    . Nephrectomy      No family history on file.  History  Substance Use Topics  . Smoking status: Never Smoker   . Smokeless tobacco: Never Used  . Alcohol Use: No    OB History   Grav Para Term Preterm Abortions TAB SAB Ect Mult Living                  Review of Systems  Level 5 caveat applies because pt with hx of MR.   Allergies  Codeine and Morphine and related  Home Medications   Current Outpatient Rx  Name  Route  Sig  Dispense  Refill  . clonazePAM (KLONOPIN) 1 MG tablet   Gastric Tube   1 mg by Gastric Tube route 2 (two) times daily.         . diphenoxylate-atropine (LOMOTIL) 2.5-0.025 MG per tablet   Gastric Tube   1 tablet by Gastric Tube route 3 (three) times daily as needed. For diarrhea         . fenofibrate (TRICOR) 145 MG tablet   Oral   Take 145 mg by mouth at bedtime.          . insulin detemir (LEVEMIR) 100 UNIT/ML injection   Subcutaneous   Inject 55 Units into the skin 2 (two) times daily.          . metFORMIN  (GLUCOPHAGE) 1000 MG tablet   Gastric Tube   1,000 mg by Gastric Tube route 2 (two) times daily with a meal.         . nitrofurantoin, macrocrystal-monohydrate, (MACROBID) 100 MG capsule   Oral   Take 1 capsule (100 mg total) by mouth 2 (two) times daily.   10 capsule   0   . PARoxetine (PAXIL) 40 MG tablet   Gastric Tube   40 mg by Gastric Tube route every morning.         . pregabalin (LYRICA) 150 MG capsule   Gastric Tube   150 mg by Gastric Tube route 2 (two) times daily.         . ranitidine (ZANTAC) 300 MG tablet   Gastric Tube   300 mg by Gastric Tube route at bedtime.           BP 138/53  Pulse 96  Temp(Src) 99.3 F (37.4 C) (Oral)  SpO2 96%  Physical Exam  Nursing note and vitals reviewed. Constitutional: She appears well-developed and  well-nourished. No distress.  HENT:  Head: Normocephalic and atraumatic.  Dry mucus membranes  Eyes: Conjunctivae are normal. Right eye exhibits no discharge. Left eye exhibits no discharge.  Neck: Neck supple.  Cardiovascular: Normal rate, regular rhythm and normal heart sounds.  Exam reveals no gallop and no friction rub.   No murmur heard. Pulmonary/Chest: Effort normal and breath sounds normal. No respiratory distress.  Abdominal: Soft. She exhibits no distension. There is no tenderness.  Well healed surgical scars. Soft and NT. Feeding tube stoma healthy appearing. No drainage. No concerning surrounding skin changes.   Musculoskeletal: She exhibits no edema and no tenderness.  Neurological: She is alert.  Skin: Skin is warm and dry.  Psychiatric: She has a normal mood and affect. Her behavior is normal. Thought content normal.    ED Course  Gastrostomy tube replacement Date/Time: 03/22/2012 9:36 AM Performed by: Raeford Razor Authorized by: Raeford Razor Consent: Verbal consent obtained. Risks and benefits: risks, benefits and alternatives were discussed Time out: Immediately prior to procedure a "time out"  was called to verify the correct patient, procedure, equipment, support staff and site/side marked as required. Preparation: Patient was prepped and draped in the usual sterile fashion. Local anesthesia used: no Patient sedated: no Patient tolerance: Patient tolerated the procedure well with no immediate complications. Comments: Unable to pass 13F feeding tube. Passed 36F foley easily and then subsequently 50F foley. Then attempted 47F feeding tube without success. Could not locate 50F feeding tube from ED. Pt being discharged with 50F foley in place.    (including critical care time)  Labs Reviewed - No data to display Dg Abd 1 View  03/22/2012  *RADIOLOGY REPORT*  Clinical Data: Evaluate G-tube placement.  ABDOMEN - 1 VIEW  Comparison: 10/27/2011.  Findings: A single radiograph of the upper abdomen was obtained following the injection of the patient's G-tube with contrast material.  The injected contrast material is confined completely within the stomach (no extravasation noted).  Multiple surgical clips are seen inferior to the stomach.  Visualized bowel gas pattern is nonobstructive.  IMPRESSION: 1.  G-tube appears properly located.   Original Report Authenticated By: Trudie Reed, M.D.      1. Complication of feeding tube       MDM  71yF with feeding tube dislodged. 47f foley placed as temporizing measure. Originally placed by GI, Dr Randa Evens, in 09/2009. Pt to follow-up with GI as outpt as soon as she can.         Raeford Razor, MD 03/22/12 1002

## 2012-03-22 NOTE — ED Notes (Signed)
[facility] called for transportation back home. 

## 2012-03-22 NOTE — ED Notes (Signed)
Per EMS- Patient lives at home. Patient's feeding tube came out during the night. Feeding tube was not brought to the ED. Patient had taking meds for recent UTI.

## 2012-04-08 ENCOUNTER — Emergency Department (HOSPITAL_COMMUNITY): Payer: 59

## 2012-04-08 ENCOUNTER — Encounter (HOSPITAL_COMMUNITY): Payer: Self-pay | Admitting: Internal Medicine

## 2012-04-08 ENCOUNTER — Inpatient Hospital Stay (HOSPITAL_COMMUNITY)
Admission: EM | Admit: 2012-04-08 | Discharge: 2012-04-21 | DRG: 193 | Disposition: A | Discharge status: Home Health | Payer: 59 | Attending: Internal Medicine | Admitting: Internal Medicine

## 2012-04-08 DIAGNOSIS — D649 Anemia, unspecified: Secondary | ICD-10-CM

## 2012-04-08 DIAGNOSIS — Z905 Acquired absence of kidney: Secondary | ICD-10-CM

## 2012-04-08 DIAGNOSIS — E785 Hyperlipidemia, unspecified: Secondary | ICD-10-CM | POA: Diagnosis present

## 2012-04-08 DIAGNOSIS — F419 Anxiety disorder, unspecified: Secondary | ICD-10-CM | POA: Diagnosis present

## 2012-04-08 DIAGNOSIS — R000 Tachycardia, unspecified: Secondary | ICD-10-CM

## 2012-04-08 DIAGNOSIS — G9341 Metabolic encephalopathy: Secondary | ICD-10-CM

## 2012-04-08 DIAGNOSIS — E875 Hyperkalemia: Secondary | ICD-10-CM | POA: Diagnosis present

## 2012-04-08 DIAGNOSIS — E872 Acidosis, unspecified: Secondary | ICD-10-CM

## 2012-04-08 DIAGNOSIS — J189 Pneumonia, unspecified organism: Principal | ICD-10-CM

## 2012-04-08 DIAGNOSIS — F411 Generalized anxiety disorder: Secondary | ICD-10-CM | POA: Diagnosis present

## 2012-04-08 DIAGNOSIS — R4182 Altered mental status, unspecified: Secondary | ICD-10-CM

## 2012-04-08 DIAGNOSIS — G809 Cerebral palsy, unspecified: Secondary | ICD-10-CM | POA: Diagnosis present

## 2012-04-08 DIAGNOSIS — Z931 Gastrostomy status: Secondary | ICD-10-CM

## 2012-04-08 DIAGNOSIS — E876 Hypokalemia: Secondary | ICD-10-CM

## 2012-04-08 DIAGNOSIS — IMO0001 Reserved for inherently not codable concepts without codable children: Secondary | ICD-10-CM

## 2012-04-08 DIAGNOSIS — R4189 Other symptoms and signs involving cognitive functions and awareness: Secondary | ICD-10-CM

## 2012-04-08 DIAGNOSIS — R32 Unspecified urinary incontinence: Secondary | ICD-10-CM | POA: Diagnosis present

## 2012-04-08 DIAGNOSIS — I10 Essential (primary) hypertension: Secondary | ICD-10-CM

## 2012-04-08 DIAGNOSIS — R509 Fever, unspecified: Secondary | ICD-10-CM | POA: Diagnosis present

## 2012-04-08 DIAGNOSIS — E43 Unspecified severe protein-calorie malnutrition: Secondary | ICD-10-CM

## 2012-04-08 DIAGNOSIS — R21 Rash and other nonspecific skin eruption: Secondary | ICD-10-CM | POA: Diagnosis present

## 2012-04-08 DIAGNOSIS — F329 Major depressive disorder, single episode, unspecified: Secondary | ICD-10-CM | POA: Diagnosis present

## 2012-04-08 DIAGNOSIS — K5900 Constipation, unspecified: Secondary | ICD-10-CM | POA: Diagnosis present

## 2012-04-08 DIAGNOSIS — R569 Unspecified convulsions: Secondary | ICD-10-CM | POA: Diagnosis present

## 2012-04-08 DIAGNOSIS — I498 Other specified cardiac arrhythmias: Secondary | ICD-10-CM | POA: Diagnosis present

## 2012-04-08 DIAGNOSIS — F3289 Other specified depressive episodes: Secondary | ICD-10-CM | POA: Diagnosis present

## 2012-04-08 DIAGNOSIS — E119 Type 2 diabetes mellitus without complications: Secondary | ICD-10-CM | POA: Diagnosis present

## 2012-04-08 HISTORY — DX: Urinary tract infection, site not specified: N39.0

## 2012-04-08 LAB — COMPREHENSIVE METABOLIC PANEL
ALT: 14 U/L (ref 0–35)
Alkaline Phosphatase: 44 U/L (ref 39–117)
BUN: 18 mg/dL (ref 6–23)
CO2: 17 mEq/L — ABNORMAL LOW (ref 19–32)
Chloride: 114 mEq/L — ABNORMAL HIGH (ref 96–112)
GFR calc Af Amer: >90 mL/min (ref >90)
GFR calc non Af Amer: >90 mL/min (ref >90)
Glucose, Bld: 232 mg/dL — ABNORMAL HIGH (ref 70–99)
Potassium: 2.5 mEq/L — CL (ref 3.5–5.1)
Sodium: 142 mEq/L (ref 135–145)
Total Bilirubin: 0.2 mg/dL — ABNORMAL LOW (ref 0.3–1.2)
Total Protein: 4.8 g/dL — ABNORMAL LOW (ref 6.0–8.3)

## 2012-04-08 LAB — CBC WITH DIFFERENTIAL/PLATELET
Hemoglobin: 8.8 g/dL — ABNORMAL LOW (ref 12.0–15.0)
Lymphocytes Relative: 32 % (ref 12–46)
Lymphs Abs: 1.9 10*3/uL (ref 0.7–4.0)
Monocytes Relative: 8 % (ref 3–12)
Neutro Abs: 3.6 10*3/uL (ref 1.7–7.7)
Neutrophils Relative %: 60 % (ref 43–77)
Platelets: 191 10*3/uL (ref 150–400)
RBC: 2.97 MIL/uL — ABNORMAL LOW (ref 3.87–5.11)
WBC: 5.9 10*3/uL (ref 4.0–10.5)

## 2012-04-08 LAB — RENAL FUNCTION PANEL
CO2: 19 mEq/L (ref 19–32)
Chloride: 105 mEq/L (ref 96–112)
Creatinine, Ser: 0.47 mg/dL — ABNORMAL LOW (ref 0.50–1.10)
GFR calc Af Amer: >90 mL/min (ref >90)
GFR calc non Af Amer: >90 mL/min (ref >90)
Glucose, Bld: 260 mg/dL — ABNORMAL HIGH (ref 70–99)
Sodium: 137 mEq/L (ref 135–145)

## 2012-04-08 LAB — URINALYSIS, ROUTINE W REFLEX MICROSCOPIC
Bilirubin Urine: NEGATIVE
Glucose, UA: >1000 mg/dL — AB
Hgb urine dipstick: NEGATIVE
Protein, ur: 100 mg/dL — AB

## 2012-04-08 LAB — MRSA PCR SCREENING: MRSA by PCR: NEGATIVE

## 2012-04-08 LAB — OCCULT BLOOD, POC DEVICE: Fecal Occult Bld: NEGATIVE

## 2012-04-08 LAB — ACETAMINOPHEN LEVEL: Acetaminophen (Tylenol), Serum: <15 ug/mL (ref 10–30)

## 2012-04-08 LAB — GLUCOSE, CAPILLARY
Glucose-Capillary: 340 mg/dL — ABNORMAL HIGH (ref 70–99)
Glucose-Capillary: 235 mg/dL — ABNORMAL HIGH (ref 70–99)

## 2012-04-08 LAB — TSH: TSH: 1.223 u[IU]/mL (ref 0.350–4.500)

## 2012-04-08 LAB — RAPID URINE DRUG SCREEN, HOSP PERFORMED
Barbiturates: NOT DETECTED
Benzodiazepines: NOT DETECTED
Cocaine: NOT DETECTED
Tetrahydrocannabinol: NOT DETECTED

## 2012-04-08 LAB — LACTIC ACID, PLASMA: Lactic Acid, Venous: 2.4 mmol/L — ABNORMAL HIGH (ref 0.5–2.2)

## 2012-04-08 LAB — INFLUENZA PANEL BY PCR (TYPE A & B): H1N1 flu by pcr: NOT DETECTED

## 2012-04-08 MED ORDER — LORAZEPAM 2 MG/ML IJ SOLN: 1.0000 mg | Freq: Four times a day (QID) | INTRAMUSCULAR | Status: AC | PRN

## 2012-04-08 MED ORDER — FOLIC ACID 1 MG PO TABS
1.0000 mg | ORAL_TABLET | Freq: Every day | ORAL | Status: DC
  Administered 2012-04-08 – 2012-04-21 (×14): 1 mg
  Filled 2012-04-08 (×15): qty 1

## 2012-04-08 MED ORDER — LEVOFLOXACIN IN D5W 750 MG/150ML IV SOLN
750.0000 mg | INTRAVENOUS | Status: AC
  Administered 2012-04-08 – 2012-04-10 (×3): 750 mg via INTRAVENOUS
  Filled 2012-04-08 (×3): qty 150

## 2012-04-08 MED ORDER — VANCOMYCIN HCL IN DEXTROSE 1-5 GM/200ML-% IV SOLN
1000.0000 mg | Freq: Two times a day (BID) | INTRAVENOUS | Status: DC
  Administered 2012-04-08 – 2012-04-09 (×3): 1000 mg via INTRAVENOUS
  Filled 2012-04-08 (×5): qty 200

## 2012-04-08 MED ORDER — LORAZEPAM 1 MG PO TABS: 1.0000 mg | ORAL_TABLET | Freq: Four times a day (QID) | ORAL | Status: AC | PRN

## 2012-04-08 MED ORDER — ENOXAPARIN SODIUM 40 MG/0.4ML ~~LOC~~ SOLN
40.0000 mg | SUBCUTANEOUS | Status: DC
  Administered 2012-04-08 – 2012-04-20 (×13): 40 mg via SUBCUTANEOUS
  Filled 2012-04-08 (×14): qty 0.4

## 2012-04-08 MED ORDER — PIPERACILLIN-TAZOBACTAM 3.375 G IVPB 30 MIN
3.3750 g | INTRAVENOUS | Status: AC
  Administered 2012-04-08: 3.375 g via INTRAVENOUS
  Filled 2012-04-08: qty 50

## 2012-04-08 MED ORDER — THIAMINE HCL 100 MG/ML IJ SOLN
100.0000 mg | Freq: Every day | INTRAMUSCULAR | Status: DC
  Filled 2012-04-08 (×14): qty 1

## 2012-04-08 MED ORDER — VITAMIN B-1 100 MG PO TABS
100.0000 mg | ORAL_TABLET | Freq: Every day | ORAL | Status: DC
  Administered 2012-04-08 – 2012-04-21 (×14): 100 mg
  Filled 2012-04-08 (×14): qty 1

## 2012-04-08 MED ORDER — ADULT MULTIVITAMIN W/MINERALS CH
1.0000 | ORAL_TABLET | Freq: Every day | ORAL | Status: DC
  Administered 2012-04-08 – 2012-04-21 (×14): 1
  Filled 2012-04-08 (×14): qty 1

## 2012-04-08 MED ORDER — VANCOMYCIN HCL IN DEXTROSE 1-5 GM/200ML-% IV SOLN
1000.0000 mg | INTRAVENOUS | Status: AC
  Administered 2012-04-08: 1000 mg via INTRAVENOUS
  Filled 2012-04-08: qty 200

## 2012-04-08 MED ORDER — POLYETHYLENE GLYCOL 3350 17 G PO PACK
17.0000 g | PACK | Freq: Every day | ORAL | Status: DC | PRN
  Filled 2012-04-08: qty 1

## 2012-04-08 MED ORDER — FAMOTIDINE IN NACL 20-0.9 MG/50ML-% IV SOLN
20.0000 mg | Freq: Two times a day (BID) | INTRAVENOUS | Status: DC
  Administered 2012-04-08 – 2012-04-10 (×6): 20 mg via INTRAVENOUS
  Filled 2012-04-08 (×9): qty 50

## 2012-04-08 MED ORDER — SODIUM CHLORIDE 0.9 % IV SOLN
INTRAVENOUS | Status: DC
  Administered 2012-04-08: 20:00:00 via INTRAVENOUS

## 2012-04-08 MED ORDER — FENOFIBRATE 160 MG PO TABS
160.0000 mg | ORAL_TABLET | Freq: Every day | ORAL | Status: DC
  Administered 2012-04-08 – 2012-04-21 (×14): 160 mg via ORAL
  Filled 2012-04-08 (×14): qty 1

## 2012-04-08 MED ORDER — JEVITY 1.2 CAL PO LIQD
237.0000 mL | Freq: Four times a day (QID) | ORAL | Status: DC
Start: 2012-04-08 — End: 2012-04-10
  Administered 2012-04-08 – 2012-04-09 (×7): 237 mL
  Filled 2012-04-08 (×9): qty 237

## 2012-04-08 MED ORDER — POTASSIUM CHLORIDE CRYS ER 20 MEQ PO TBCR: 40.0000 meq | EXTENDED_RELEASE_TABLET | Freq: Once | ORAL | Status: DC

## 2012-04-08 MED ORDER — INSULIN DETEMIR 100 UNIT/ML ~~LOC~~ SOLN
55.0000 [IU] | Freq: Two times a day (BID) | SUBCUTANEOUS | Status: DC
  Administered 2012-04-08 – 2012-04-09 (×3): 55 [IU] via SUBCUTANEOUS
  Filled 2012-04-08 (×2): qty 10

## 2012-04-08 MED ORDER — POTASSIUM CHLORIDE 10 MEQ/100ML IV SOLN
10.0000 meq | INTRAVENOUS | Status: DC
  Administered 2012-04-08 (×2): 10 meq via INTRAVENOUS
  Filled 2012-04-08 (×2): qty 100
  Filled 2012-04-08: qty 400

## 2012-04-08 MED ORDER — MAGNESIUM SULFATE 40 MG/ML IJ SOLN
2.0000 g | Freq: Once | INTRAMUSCULAR | Status: AC
  Administered 2012-04-08: 2 g via INTRAVENOUS
  Filled 2012-04-08: qty 50

## 2012-04-08 MED ORDER — INSULIN ASPART 100 UNIT/ML ~~LOC~~ SOLN
0.0000 [IU] | Freq: Three times a day (TID) | SUBCUTANEOUS | Status: DC
  Administered 2012-04-08: 5 [IU] via SUBCUTANEOUS
  Administered 2012-04-09: 2 [IU] via SUBCUTANEOUS
  Administered 2012-04-09: 5 [IU] via SUBCUTANEOUS

## 2012-04-08 MED ORDER — POTASSIUM CHLORIDE IN NACL 20-0.9 MEQ/L-% IV SOLN
INTRAVENOUS | Status: DC
  Administered 2012-04-08: 18:00:00 via INTRAVENOUS
  Filled 2012-04-08: qty 1000

## 2012-04-08 MED ORDER — SENNOSIDES 8.8 MG/5ML PO SYRP
10.0000 mL | ORAL_SOLUTION | Freq: Every evening | ORAL | Status: DC | PRN
  Filled 2012-04-08: qty 10

## 2012-04-08 MED ORDER — SODIUM CHLORIDE 0.9 % IV SOLN: INTRAVENOUS | Status: DC

## 2012-04-08 MED ORDER — DOCUSATE SODIUM 50 MG/5ML PO LIQD
100.0000 mg | Freq: Two times a day (BID) | ORAL | Status: DC
  Administered 2012-04-08 – 2012-04-21 (×24): 100 mg
  Filled 2012-04-08 (×28): qty 10

## 2012-04-08 MED ORDER — DEXTROSE 5 % IV SOLN
1.0000 g | Freq: Three times a day (TID) | INTRAVENOUS | Status: DC
  Administered 2012-04-08 – 2012-04-11 (×9): 1 g via INTRAVENOUS
  Filled 2012-04-08 (×11): qty 1

## 2012-04-08 MED ORDER — SODIUM CHLORIDE 0.9 % IV SOLN
1000.0000 mL | Freq: Once | INTRAVENOUS | Status: AC
  Administered 2012-04-08: 1000 mL via INTRAVENOUS

## 2012-04-08 MED ORDER — SODIUM CHLORIDE 0.9 % IV SOLN
1000.0000 mL | INTRAVENOUS | Status: DC
  Administered 2012-04-08 – 2012-04-17 (×14): 1000 mL via INTRAVENOUS

## 2012-04-08 MED ORDER — SODIUM CHLORIDE 0.9 % IV SOLN
1.0000 g | Freq: Once | INTRAVENOUS | Status: AC
  Administered 2012-04-08: 1 g via INTRAVENOUS
  Filled 2012-04-08: qty 10

## 2012-04-08 MED ORDER — PAROXETINE HCL 20 MG PO TABS
40.0000 mg | ORAL_TABLET | Freq: Every morning | ORAL | Status: DC
  Administered 2012-04-09 – 2012-04-21 (×13): 40 mg
  Filled 2012-04-08 (×13): qty 2

## 2012-04-08 MED ORDER — POTASSIUM CHLORIDE 10 MEQ/100ML IV SOLN
10.0000 meq | INTRAVENOUS | Status: AC
  Administered 2012-04-08 (×2): 10 meq via INTRAVENOUS
  Filled 2012-04-08 (×4): qty 100

## 2012-04-08 MED ORDER — INSULIN ASPART 100 UNIT/ML ~~LOC~~ SOLN
0.0000 [IU] | Freq: Every day | SUBCUTANEOUS | Status: DC
  Administered 2012-04-08: 3 [IU] via SUBCUTANEOUS
  Administered 2012-04-09: 23:00:00 via SUBCUTANEOUS

## 2012-04-08 MED ORDER — MAGNESIUM CITRATE PO SOLN: 1.0000 | Freq: Once | ORAL | Status: AC | PRN
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
## 2012-04-08 NOTE — ED Notes (Signed)
ZOX:[id number]<AV> Expected date:<BR> Expected time:<BR> Means of arrival:<BR> Comments:<BR> EMS/altered LOC/hypertensive/hyperglycemia

## 2012-04-08 NOTE — H&P (Addendum)
[facility] History and Physical  [name]:[id number] DOB: 18-Jul-1940 DOA: 04/08/2012  Referring physician:  Chaney Malling PCP:  No primary provider on file.   Chief Complaint:  Confusion  HPI:  The patient is a 72 y.o. year-old female with history of CP, possible MR, Parkinsonism, T2DM, renal cancer, dysphagia with recurrent aspiration pneumonia, bladder spasms who presents with altered mental status and fever.  The patient was last at their baseline health until the last two days.  Per her husband who is her primary care taker, she had subjective fevers yesterday and has been sleepier than usual.  Her fingersticks have been elevated recently also.  He denies that she has had any headache, rhinorrhea, sinus congestion, cough, shortness of breath, vomiting, diarrhea, constipation, skin rash or breakdown or other signs of infection.  She has been receiving her tube feeds and medications regularly.  Denies falls or trauma.    In the ER, she was noted to have hgb 8.8mg /dl, potassium 2.5, CO2 17, calcium 5.8.  CT head was neg for acute process, but CXR demonstrated LLL opacity.  WBC 5.9 and afebrile in ER.  UDS was positive for narcotics (takes vicodin at home) but negative for benzos (supposed to take clonazepam BID and husband reports she received her morning dose today).    Review of Systems:  Per husband.  Patient unable to answer due to MR and confusion from present illness.  Denies weight loss or gain, changes to hearing and vision.  Denies rhinorrhea, sinus congestion, sore throat.  Denies chest pain and palpitations.  Denies SOB, wheezing, cough.  Denies nausea, vomiting, constipation, diarrhea.  Baseline urinary and stool incontinence.  Denies hematemesis, blood in stools, melena, abnormal bruising or bleeding.  Denies lymphadenopathy.  Denies arthralgias, myalgias.  Denies skin rash or ulcer.  Denies lower extremity edema. Generalized weakness and sleepier than usual.    Past  Medical History  Diagnosis Date  . Diabetes mellitus   . Renal disorder   . CP (cerebral palsy)   . Renal cancer     s/p nephrectomy  . Parkinsonism   . Dysphagia   . Recurrent aspiration pneumonia   . Bladder spasms   . Amputation of arm   . Recurrent UTI    Past Surgical History  Procedure Laterality Date  . 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 Peg tube placement    . Knee surgery    . Nephrectomy     Social History:  reports that she has never smoked. She has never used smokeless tobacco. She reports that she does not drink alcohol or use illicit drugs.  Lives with her husband and husband is her primary caretaker.  Completely bedbound.  Uses diaper for voiding and bowel movements.  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  Sitter stays during the day.    Allergies  Allergen Reactions  . Codeine Nausea And Vomiting  . Morphine And Related Nausea And Vomiting    No family history on file.   Patient unable to report due to MR and confusion and husband states that he is unsure of his wife's family's medical history.    Prior to Admission medications   Medication Sig Start Date End Date Taking? Authorizing Provider  clonazePAM (KLONOPIN) 1 MG tablet Place 1 mg into feeding tube 2 (two) times daily.    Yes Historical Provider, MD  diphenoxylate-atropine (LOMOTIL) 2.5-0.025 MG per tablet Place 1 tablet into  feeding tube 3 (three) times daily as needed. For diarrhea   Yes Historical Provider, MD  fenofibrate (TRICOR) 145 MG tablet Place 145 mg into feeding tube at bedtime.    Yes Historical Provider, MD  HYDROcodone-acetaminophen (NORCO/VICODIN) 5-325 MG per tablet Place 1 tablet into feeding tube 3 (three) times daily as needed for pain.   Yes Historical Provider, MD  insulin detemir (LEVEMIR) 100 UNIT/ML injection Inject 55 Units into the skin 2 (two) times daily.    Yes Historical Provider, MD  metFORMIN (GLUCOPHAGE) 1000 MG tablet Take 1,000 mg by  mouth 2 (two) times daily with a meal.    Yes Historical Provider, MD  Nutritional Supplements (FEEDING SUPPLEMENT, JEVITY 1.2 CAL,) LIQD Place 237 mLs into feeding tube 4 (four) times daily. Flush with 8oz of water after each tube feed.   Yes Historical Provider, MD  PARoxetine (PAXIL) 40 MG tablet Place 40 mg into feeding tube every morning.    Yes Historical Provider, MD  pregabalin (LYRICA) 150 MG capsule Place 150 mg into feeding tube 2 (two) times daily.    Yes Historical Provider, MD  ranitidine (ZANTAC) 300 MG tablet Place 300 mg into feeding tube at bedtime.    Yes Historical Provider, MD   Physical Exam: Filed Vitals:   04/08/12 0918 04/08/12 0930 04/08/12 1045 04/08/12 1300  BP:  130/61 140/56 140/71  Pulse:  101  102  Temp: 99.1 F (37.3 C)     TempSrc: Rectal     Resp:  28 21 22   Height:    5\' 3"  (1.6 m)  Weight:    78.3 kg (172 lb 9.9 oz)  SpO2:  97% 98% 96%     General:  Overweight caucasian female, no acute distress, lying on stretcher with head turned to left  Eyes:  PERRL, anicteric, non-injected.  ENT:  Nares clear.  OP clear, non-erythematous without plaques or exudates.  MMM  Neck:  Supple without TM or JVD.    Lymph:  No cervical, supraclavicular, or submandibular LAD.  Cardiovascular:  RRR, normal S1, S2, without m/r/g.  2+ pulses, warm extremities  Respiratory:  CTA bilaterally without increased WOB anteriorly  Abdomen:  NABS.  Soft, ND/NT.    Skin:  G-tube site with minimal surrounding erythema without induration.  Dressings were in place were crusted.    Musculoskeletal:  Decreased bulk and tone, particularly lower extremities.  Absent left hand, amputated above the wrist.  No LE edema.  Ankles and feet somewhat limited ROM  Psychiatric:  A & interactive, able to follow some commands.    Neurologic:  3/5 strength head/neck and upper extremities.  1/5 lower extremity strength.  Unable to turn head.   Labs on Admission:  Basic Metabolic  Panel:  Recent Labs Lab 04/08/12 0830  NA 142  K 2.5*  CL 114*  CO2 17*  GLUCOSE 232*  BUN 18  CREATININE 0.36*  CALCIUM 5.8*  MG 1.2*   Liver Function Tests:  Recent Labs Lab 04/08/12 0830  AST 18  ALT 14  ALKPHOS 44  BILITOT 0.2*  PROT 4.8*  ALBUMIN 1.9*   No results found for this basename: LIPASE, AMYLASE,  in the last 168 hours  Recent Labs Lab 04/08/12 0830  AMMONIA 18   CBC:  Recent Labs Lab 04/08/12 0830  WBC 5.9  NEUTROABS 3.6  HGB 8.8*  HCT 27.0*  MCV 90.9  PLT 191   Cardiac Enzymes: No results found for this basename: CKTOTAL, CKMB, CKMBINDEX, TROPONINI,  in the last 168 hours  BNP (last 3 results) No results found for this basename: PROBNP,  in the last 8760 hours CBG:  Recent Labs Lab 04/08/12 0714 04/08/12 1213  GLUCAP 340* 190*    Radiological Exams on Admission: Ct Head Wo Contrast  04/08/2012  *RADIOLOGY REPORT*  Clinical Data: Altered mental status.  CT HEAD WITHOUT CONTRAST  Technique:  Contiguous axial images were obtained from the base of the skull through the vertex without contrast.  Comparison: 07/30/2009  Findings: Postsurgical changes in the right frontal lobe.  These are stable since prior study.  Atrophy.  Associated ventriculomegaly, likely related to ex vacuo dilatation.  No acute infarction or hemorrhage.  No extra-axial fluid collection.  No acute calvarial abnormality. Visualized paranasal sinuses and mastoids clear.  Orbital soft tissues unremarkable.  IMPRESSION: No acute intracranial abnormality.  No change since prior study.   Original Report Authenticated By: Charlett Nose, M.D.    Dg Chest Port 1 View  04/08/2012  *RADIOLOGY REPORT*  Clinical Data: Altered mental status, fever.  PORTABLE CHEST - 1 VIEW  Comparison: 03/02/2012  Findings: New opacity noted in the left retrocardiac region concerning for pneumonia.  Low lung volumes.  Right lung is clear. Heart is normal size.  No visible effusions or acute bony  abnormality.  IMPRESSION: Developing opacity at the left lung base concerning for pneumonia.  Low lung volumes.   Original Report Authenticated By: Charlett Nose, M.D.     EKG: Independently reviewed. Sinus tachycardia.  Left anterior fascicular block, delayed R-wave progression.  No evidence of acute ischemia.    Assessment/Plan Active Problems:   Constipation   Fever   Anxiety and depression   Dyslipidemia   Diabetes mellitus   Sinus tachycardia   HCAP (healthcare-associated pneumonia)   Normocytic anemia   Hypokalemia   Metabolic acidosis, normal anion gap (NAG)   Hypocalcemia   Hypomagnesemia   Metabolic encephalopathy in the setting of baseline MR.  Most likely changes in mental status are due to underlying infection from pneumonia but may also have some confusion secondary to metabolic acidosis or electrolyte disturbance (low magnesium, potassium, calcium).   Procalcitonin mildly elevated.   -  Tx pneumonia -  Hydrate -  Replete electrolytes -  No need for sitter or haldol at this time -  Minimize narcotics and sedating medications   Healthcare associated pneumonia, although may have aspiration pneumonia also as this is a recurrent problem.  Was hospitalized < 2 months ago -  F/u blood cultures -  Sputum culture ordered, but may not be able to obtain due to patient's ability to comply with instructions -  Flu PCR -  F/u urine legionella and step pneumonia Ag -  Vanc, cefepime, levofloxacin day 1  Sinus tachycardia, likely related to pneumonia, and possibly sepsis.  -  Admit to telemetry -  Trend HR  Dyslipidemia, stable.  Continue fibrate  Anxiety and depression: -  Hold clonazepam for now as no benzos on UDS  -  CIWA protocol  -  Continue paxil  Constipation, add prn stool softeners  T2DM: A1c 8.4 02/2012 -  Continue levemir 55 units BID -  Add SSI insulin moderate with QHS insulin  Discrepancy between medication list and UDS.  Concern for hygiene.  -  Social  work consult  Hypokalemia, likely due to imbalanced tube feeds -  IV potassium chloride and tube  Nongap metabolic acidosis:  Has not had reported diarrhea or aspirin use.  May have RTA,  but unclear cause.  Wonder if tube feeds are being administered incorrectly causing metabolic and electrolyte derangement.   -  Hydrate and trend -  Consider addition of bicarbonate to fluids if persists   Hypocalcemia, corrects to 7.5 -  Supplement with IV calcium -  Repeat  Hypomagnesemia -  Supplement with IV magnesium  Severe protein calorie malnutrition with albumin 1.9 -  Nutrition consultation -  Continue jevity tube feeds with free water flushes  Normocytic anemia, no evidence of acute bleeding, hgb considerably lower than baseline of 11mg /dl -  Occult stool -  Anemia w/u with AM labs -  Trend hemoglobin -  Tx for hb < 7  Diet:  Jevity 1.2 QID with free water 8oz with each tube feed  Access:  PIV IVF:  NS with 20kcl at 75 ml/h  Proph:  lovenox  Code Status: full Family Communication: spoke with patient and her husband Disposition Plan: admit to telemetry  Time spent: 75 minutes  Renae Fickle [facility] Pager [phone]  If 7PM-7AM, please contact night-coverage [email or web address] Password [facility] 04/08/2012, 4:15 PM

## 2012-04-08 NOTE — ED Provider Notes (Signed)
History     CSN: [id number]  Arrival date & time 04/08/12  0706   First MD Initiated Contact with Patient 04/08/12 [phone]      No chief complaint on file.   (Consider location/radiation/quality/duration/timing/severity/associated sxs/prior treatment) The history is provided by the EMS personnel. The history is limited by the absence of a caregiver and the condition of the patient.  [name] is a 72 y.o. female history of diabetes, mental retardation, Parkinson's who is bed bound, recurrent pneumonia here presenting with altered mental status. Per EMS the husband noted that the patient was more altered for the last 2 days. He also noted that she was febrile and hypertensive incontinent of urine. Her blood sugars has been running high and was 531 this morning. Husband gave her some insulin prior to calling EMS. Patient altered and unable to give any history.  Level V caveat- AMS    Past Medical History  Diagnosis Date  . Diabetes mellitus   . Renal disorder   . CP (cerebral palsy)   . Renal cancer     s/p nephrectomy  . Parkinsonism   . Dysphagia   . Recurrent aspiration pneumonia   . Bladder spasms   . Amputation of arm     Past Surgical History  Procedure Laterality Date  . Hand amputation through wrist    . Abdominal surgery    . Knee surgery    . Nephrectomy      No family history on file.  History  Substance Use Topics  . Smoking status: Never Smoker   . Smokeless tobacco: Never Used  . Alcohol Use: No    OB History   Grav Para Term Preterm Abortions TAB SAB Ect Mult Living                  Review of Systems  Unable to perform ROS: Mental status change    Allergies  Codeine and Morphine and related  Home Medications   Current Outpatient Rx  Name  Route  Sig  Dispense  Refill  . clonazePAM (KLONOPIN) 1 MG tablet   Oral   Take 1 mg by mouth 2 (two) times daily.          . diphenoxylate-atropine (LOMOTIL) 2.5-0.025 MG per tablet    Oral   Take 1 tablet by mouth 3 (three) times daily as needed. For diarrhea         . fenofibrate (TRICOR) 145 MG tablet   Oral   Take 145 mg by mouth at bedtime.          . insulin detemir (LEVEMIR) 100 UNIT/ML injection   Subcutaneous   Inject 55 Units into the skin 2 (two) times daily.          . metFORMIN (GLUCOPHAGE) 1000 MG tablet   Oral   Take 1,000 mg by mouth 2 (two) times daily with a meal.          . PARoxetine (PAXIL) 40 MG tablet   Oral   Take 40 mg by mouth every morning.          . pregabalin (LYRICA) 150 MG capsule   Oral   Take 150 mg by mouth 2 (two) times daily.          . ranitidine (ZANTAC) 300 MG tablet   Oral   Take 300 mg by mouth at bedtime.            BP 154/72  Pulse 110  Temp(Src)  99.1 F (37.3 C) (Rectal)  Resp 15  SpO2 98%  Physical Exam  Nursing note and vitals reviewed. Constitutional:  Altered, sleeping. Chronically ill   HENT:  Head: Normocephalic.  MM dry   Eyes: Conjunctivae are normal. Pupils are equal, round, and reactive to light.  No eye deviation. Pupils not small   Neck: Normal range of motion. Neck supple.  Cardiovascular: Normal rate, regular rhythm and normal heart sounds.   Pulmonary/Chest: Effort normal.  Dec breath sounds throughout   Abdominal: Soft.  Smells of urine, + mild suprapubic tenderness   Musculoskeletal: Normal range of motion.  Neurological:  Sleepy, moving all extremities   Skin: Skin is warm.  Psychiatric:  Unable     ED Course  Procedures (including critical care time)  Labs Reviewed  GLUCOSE, CAPILLARY - Abnormal; Notable for the following:    Glucose-Capillary 340 (*)    All other components within normal limits  CBC WITH DIFFERENTIAL - Abnormal; Notable for the following:    RBC 2.97 (*)    Hemoglobin 8.8 (*)    HCT 27.0 (*)    RDW 15.8 (*)    All other components within normal limits  COMPREHENSIVE METABOLIC PANEL - Abnormal; Notable for the following:     Potassium 2.5 (*)    Chloride 114 (*)    CO2 17 (*)    Glucose, Bld 232 (*)    Creatinine, Ser 0.36 (*)    Calcium 5.8 (*)    Total Protein 4.8 (*)    Albumin 1.9 (*)    Total Bilirubin 0.2 (*)    All other components within normal limits  SALICYLATE LEVEL - Abnormal; Notable for the following:    Salicylate Lvl <2.0 (*)    All other components within normal limits  CULTURE, BLOOD (ROUTINE X 2)  CULTURE, BLOOD (ROUTINE X 2)  URINE CULTURE  PROCALCITONIN  ETHANOL  ACETAMINOPHEN LEVEL  LACTIC ACID, PLASMA  URINALYSIS, ROUTINE W REFLEX MICROSCOPIC  AMMONIA  URINE RAPID DRUG SCREEN (HOSP PERFORMED)  MAGNESIUM  OCCULT BLOOD, POC DEVICE   Ct Head Wo Contrast  04/08/2012  *RADIOLOGY REPORT*  Clinical Data: Altered mental status.  CT HEAD WITHOUT CONTRAST  Technique:  Contiguous axial images were obtained from the base of the skull through the vertex without contrast.  Comparison: 07/30/2009  Findings: Postsurgical changes in the right frontal lobe.  These are stable since prior study.  Atrophy.  Associated ventriculomegaly, likely related to ex vacuo dilatation.  No acute infarction or hemorrhage.  No extra-axial fluid collection.  No acute calvarial abnormality. Visualized paranasal sinuses and mastoids clear.  Orbital soft tissues unremarkable.  IMPRESSION: No acute intracranial abnormality.  No change since prior study.   Original Report Authenticated By: Charlett Nose, M.D.    Dg Chest Port 1 View  04/08/2012  *RADIOLOGY REPORT*  Clinical Data: Altered mental status, fever.  PORTABLE CHEST - 1 VIEW  Comparison: 03/02/2012  Findings: New opacity noted in the left retrocardiac region concerning for pneumonia.  Low lung volumes.  Right lung is clear. Heart is normal size.  No visible effusions or acute bony abnormality.  IMPRESSION: Developing opacity at the left lung base concerning for pneumonia.  Low lung volumes.   Original Report Authenticated By: Charlett Nose, M.D.      No diagnosis  found.   Date: 04/08/2012  Rate: 109  Rhythm: sinus tachycardia  QRS Axis: normal  Intervals: normal  ST/T Wave abnormalities: nonspecific ST changes  Conduction Disutrbances:none  Narrative  Interpretation:   Old EKG Reviewed: unchanged    MDM  [name] is a 72 y.o. female here with AMS. Felt warm and is tachycardic. Likely septic. Will get sepsis workup. Will also do CT head. Will likely need admission for AMS.   9:37 AM Labs showed Hg 8.8, dec from previous. Occ neg, brown stool. Also K 2.5 and Ca 5.5, both supplemented. CXR showed L lung base opacity, concerning for pneumonia. Given recent admission 2 months ago for UTI, will treat for HCAP with vanc/zosyn. Cultures and UA sent. CT head unchanged. I called Dr. Malachi Bonds, who accepted the patient on telemetry.         Richardean Canal, MD 04/08/12 [phone]

## 2012-04-08 NOTE — ED Notes (Signed)
Per EMS report: pt from home: pt hx of MS and therefore bed bound.  Family called EMS for pt d/t altered mental status x 2 days.  Pt is febrile, hypertensive, incontinent of urine, and hyperglycemic.  Pt's husband gave pt her insulin about 6:15 and EMS checked a cbg of 531 at 6:20.  BP: 208/100, HR: 116, RR: 22, 92% RA but now 2LNC and 98%.  IV: 20g right index finger and of NaCl given.

## 2012-04-08 NOTE — Progress Notes (Signed)
ANTIBIOTIC CONSULT NOTE - INITIAL  Pharmacy Consult for Vanco Indication: rule out pneumonia  Allergies  Allergen Reactions  . Codeine Nausea And Vomiting  . Morphine And Related Nausea And Vomiting    Patient Measurements:    Vital Signs: Temp: 99.1 F (37.3 C) (03/02 0918) Temp src: Rectal (03/02 0918) BP: 140/56 mmHg (03/02 1045) Pulse Rate: 102 (03/02 1045) Intake/Output from previous day:   Intake/Output from this shift:    Labs:  Recent Labs  04/08/12 0830  WBC 5.9  HGB 8.8*  PLT 191  CREATININE 0.36*   The CrCl is unknown because both a height and weight (above a minimum accepted value) are required for this calculation. No results found for this basename: VANCOTROUGH, VANCOPEAK, VANCORANDOM, GENTTROUGH, GENTPEAK, GENTRANDOM, TOBRATROUGH, TOBRAPEAK, TOBRARND, AMIKACINPEAK, AMIKACINTROU, AMIKACIN,  in the last 72 hours   Microbiology: No results found for this or any previous visit (from the past 720 hour(s)).  Medical History: Past Medical History  Diagnosis Date  . Diabetes mellitus   . Renal disorder   . CP (cerebral palsy)   . Renal cancer     s/p nephrectomy  . Parkinsonism   . Dysphagia   . Recurrent aspiration pneumonia   . Bladder spasms   . Amputation of arm   . Recurrent UTI     Medications:  Anti-infectives   Start     Dose/Rate Route Frequency Ordered Stop   04/08/12 1400  ceFEPIme (MAXIPIME) 1 g in dextrose 5 % 50 mL IVPB     1 g 100 mL/hr over 30 Minutes Intravenous 3 times per day 04/08/12 1202 04/16/12 1359   04/08/12 1215  levofloxacin (LEVAQUIN) IVPB 750 mg     750 mg 100 mL/hr over 90 Minutes Intravenous Every 24 hours 04/08/12 1202 04/11/12 1214   04/08/12 0900  vancomycin (VANCOCIN) IVPB 1000 mg/200 mL premix     1,000 mg 200 mL/hr over 60 Minutes Intravenous To Emergency Dept 04/08/12 0808 04/08/12 1038   04/08/12 0900  piperacillin-tazobactam (ZOSYN) IVPB 3.375 g     3.375 g 100 mL/hr over 30 Minutes Intravenous To  Emergency Dept 04/08/12 0808 04/08/12 1012     Assessment: 72 yo F admitted 3/2 with AMS. Of note, pt has a history of hand amputation and nephrectomy (renal cancer). Recent admission 2 months ago. Order to start Vanco x8 days for HCAP. Pt has already received Zosyn 3.375g IV x1 at 09:42 and Vanco 1g IV at 09:38 today. Currently active abx include cefepime 1g IV q8h and Levaquin 750mg  IV q24h. Wt=78.3kg (03/05/12), CrCl(n) >100. Given hx of nephrectomy and age of 52, will use more cautious Vanco dosing initially.  Goal of Therapy:  Vancomycin trough level 15-20 mcg/ml  Plan:  1) Vanco 1000mg  IV q12h 2) No changes to cefepime or Levaquin 3) F/U updated ht/wt info  Darrol Angel, PharmD Pager: [phone] 04/08/2012,12:08 PM

## 2012-04-09 LAB — URINE CULTURE
Colony Count: NO GROWTH
Culture: NO GROWTH

## 2012-04-09 LAB — RENAL FUNCTION PANEL
Calcium: 8.8 mg/dL (ref 8.4–10.5)
GFR calc Af Amer: >90 mL/min (ref >90)
GFR calc non Af Amer: >90 mL/min (ref >90)
Phosphorus: 2.1 mg/dL — ABNORMAL LOW (ref 2.3–4.6)
Sodium: 135 mEq/L (ref 135–145)

## 2012-04-09 LAB — MAGNESIUM: Magnesium: 2 mg/dL (ref 1.5–2.5)

## 2012-04-09 LAB — IRON AND TIBC
Iron: 44 ug/dL (ref 42–135)
TIBC: 347 ug/dL (ref 250–470)

## 2012-04-09 LAB — GLUCOSE, CAPILLARY
Glucose-Capillary: 128 mg/dL — ABNORMAL HIGH (ref 70–99)
Glucose-Capillary: 212 mg/dL — ABNORMAL HIGH (ref 70–99)

## 2012-04-09 LAB — LEGIONELLA ANTIGEN, URINE: Legionella Antigen, Urine: NEGATIVE

## 2012-04-09 LAB — CBC
MCHC: 32.5 g/dL (ref 30.0–36.0)
Platelets: 229 10*3/uL (ref 150–400)
RDW: 15.6 % — ABNORMAL HIGH (ref 11.5–15.5)

## 2012-04-09 LAB — TRANSFERRIN: Transferrin: 277 mg/dL (ref 200–360)

## 2012-04-09 LAB — TSH: TSH: 4.867 u[IU]/mL — ABNORMAL HIGH (ref 0.350–4.500)

## 2012-04-09 MED ORDER — VITAMINS A & D EX OINT
TOPICAL_OINTMENT | CUTANEOUS | Status: AC
  Administered 2012-04-09: 11:00:00
  Filled 2012-04-09: qty 5

## 2012-04-09 MED ORDER — FERROUS SULFATE 220 (44 FE) MG/5ML PO ELIX
220.0000 mg | ORAL_SOLUTION | Freq: Every day | ORAL | Status: DC
  Filled 2012-04-09: qty 5

## 2012-04-09 MED ORDER — INSULIN ASPART 100 UNIT/ML ~~LOC~~ SOLN
0.0000 [IU] | Freq: Three times a day (TID) | SUBCUTANEOUS | Status: DC
  Administered 2012-04-09 – 2012-04-10 (×2): 11 [IU] via SUBCUTANEOUS

## 2012-04-09 MED ORDER — INSULIN DETEMIR 100 UNIT/ML ~~LOC~~ SOLN
60.0000 [IU] | Freq: Two times a day (BID) | SUBCUTANEOUS | Status: DC
  Administered 2012-04-09 – 2012-04-16 (×14): 60 [IU] via SUBCUTANEOUS
  Filled 2012-04-09: qty 10

## 2012-04-09 MED ORDER — HYDRALAZINE HCL 20 MG/ML IJ SOLN
10.0000 mg | INTRAMUSCULAR | Status: DC | PRN
  Administered 2012-04-13 – 2012-04-19 (×5): 10 mg via INTRAVENOUS
  Filled 2012-04-09 (×4): qty 1

## 2012-04-09 MED ORDER — FERROUS SULFATE 300 (60 FE) MG/5ML PO SYRP
300.0000 mg | ORAL_SOLUTION | Freq: Every day | ORAL | Status: DC
  Administered 2012-04-10 – 2012-04-21 (×12): 300 mg
  Filled 2012-04-09 (×16): qty 5

## 2012-04-09 NOTE — Progress Notes (Signed)
PT/OT Note OT and PT spoke with pt and husband. Pt is bed bound and has A with all ADL activities.  Husband states they have hoyer lift at home if she gets out of bed.  PT and OT signing off as pt not appropriate for skilled therapy. Lise Auer, OT

## 2012-04-09 NOTE — Progress Notes (Signed)
Zenovia Jarred, PT, DPT 04/09/2012 Pager: [phone]

## 2012-04-09 NOTE — Progress Notes (Addendum)
[facility] PROGRESS NOTE  [name] RUE:[id number] DOB: 1940/10/24 DOA: 04/08/2012 PCP: No primary provider on file.  Assessment/Plan  Metabolic encephalopathy in the setting of baseline MR.  Most likely due to pneumonia, metabolic acidosis, or electrolyte disturbance (low magnesium, potassium, calcium).  Resolved this morning.  - Continue tx pneumonia  - Continue hydration  - Replete electrolytes  - No need for sitter or haldol at this time  - Minimize narcotics and sedating medications   Healthcare associated pneumonia, although may have aspiration pneumonia also as this is a recurrent problem. Was hospitalized < 2 months ago.   - blood cultures NGTD - Flu PCR neg - Urine legionella pending -  step pneumonia Ag neg - Vanc, cefepime, levofloxacin day 2  Sinus tachycardia, likely related to pneumonia, and possibly sepsis, resolving.  No red alarms on telemetry -  D/C telemetry  Dyslipidemia, stable. Continue fibrate   Anxiety and depression:  Hold clonazepam for now as no benzos on UDS.  No signs of withdrawal, CIWA scores 0.  Pharmacist verified that patient has been having prescription filled monthly for the last 4 months. - CIWA protocol  - Continue paxil   Elevated blood pressures, may be related to acute illness -  Add hydralazine prn -  Trend  Constipation, resolved.  3BM since admission  T2DM: A1c 8.4 02/2012  Fingersticks elevated yesterday  - Continue levemir 55 units BID  - Continue SSI insulin moderate with QHS insulin and trend CBGs today  Discrepancy between medication list and UDS - also concerned that patient may not be receiving appropriate tube feeds.  Concern for hygiene.  - Appreciate social work assistance   Hypokalemia, likely due to imbalanced tube feeds, resolved with IV potassium chloride  Nongap metabolic acidosis: Has not had reported diarrhea or aspirin use. May have RTA, but unclear cause. Wonder if tube feeds are being  administered incorrectly causing metabolic and electrolyte derangement.  Resolved this morning.    Hypocalcemia, resolved with IV calcium   Hypomagnesemia, resolved with IV magnesium   Severe protein calorie malnutrition with albumin 1.9  - Nutrition consultation pending - Continue jevity tube feeds with free water flushes   Normocytic anemia, no evidence of acute bleeding, hgb considerably lower than baseline of 11mg /dl, now normalized this morning despite no transfusion.   - Occult stool  - Anemia w/u with AM labs:  Iron level low normal range, saturation below normal and ferritin only 24.  Likely component of iron deficiency.   -  Start daily iron -  TSH and B12 wnl -  Folate pending  Diet: Jevity 1.2 QID with free water 8oz with each tube feed  Access: PIV  IVF:  OFF  Proph: lovenox   Code Status: full  Family Communication: spoke with patient and her husband  Disposition Plan:  Continue IV antibiotics.  PT/OT/Nutrition pending.     Consultants:  none  Procedures:  none  Antibiotics:  vanc 3/2 >>  Cefepime 3/2 >>  Levofloxacin 3/2 >>   HPI/Subjective:  States that she feels stronger and overall better today.  Has some sinus congestion without sore throat.  Denies cough, shortness of breath, chest pain.  Tolerating tube feeds without complication.  Having BMs and voiding well.    Objective: Filed Vitals:   04/08/12 1045 04/08/12 1300 04/08/12 2044 04/09/12 0615  BP: 140/56 140/71 163/67 159/68  Pulse:  102 107 99  Temp:   98.9 F (37.2 C) 98.8 F (37.1 C)  TempSrc:  Oral Oral  Resp: 21 22 22 20   Height:  5\' 3"  (1.6 m)    Weight:  78.3 kg (172 lb 9.9 oz)    SpO2: 98% 96% 100% 99%    Intake/Output Summary (Last 24 hours) at 04/09/12 1043 Last data filed at 04/09/12 0900  Gross per 24 hour  Intake 1311.25 ml  Output    600 ml  Net 711.25 ml   Filed Weights   04/08/12 1300  Weight: 78.3 kg (172 lb 9.9 oz)    Exam:   General:   Overweight CF, No acute distress  HEENT:  NCAT, MMM, dry lips  Cardiovascular:  RRR, nl S1, S2 no mrg, 2+ pulses, warm extremities  Respiratory:  CTAB, no increased WOB  Abdomen:   NABS, soft, NT/ND, PEG tube site c/d/i  MSK:   Decreased tone and bulk, trace LEE, left forearm amputated  Neuro:  Alert and conversant.  Able to answer questions and follow commands.  Decreased tone and bulk, NO movement in lower extremities and upper extremities 4/5.  Able to turn head side to side today.    Data Reviewed: Basic Metabolic Panel:  Recent Labs Lab 04/08/12 0830 04/08/12 1700 04/09/12 0400 04/09/12 0500  NA 142 137 135  --   K 2.5* 4.6 3.9  --   CL 114* 105 103  --   CO2 17* 19 23  --   GLUCOSE 232* 260* 182*  --   BUN 18 24* 20  --   CREATININE 0.36* 0.47* 0.50  --   CALCIUM 5.8* 8.8 8.8  --   MG 1.2*  --   --  2.0  PHOS  --  2.3 2.1*  --    Liver Function Tests:  Recent Labs Lab 04/08/12 0830 04/08/12 1700 04/09/12 0400  AST 18  --   --   ALT 14  --   --   ALKPHOS 44  --   --   BILITOT 0.2*  --   --   PROT 4.8*  --   --   ALBUMIN 1.9* 2.7* 2.8*   No results found for this basename: LIPASE, AMYLASE,  in the last 168 hours  Recent Labs Lab 04/08/12 0830  AMMONIA 18   CBC:  Recent Labs Lab 04/08/12 0830 04/09/12 0500  WBC 5.9 7.7  NEUTROABS 3.6  --   HGB 8.8* 11.0*  HCT 27.0* 33.8*  MCV 90.9 90.1  PLT 191 229   Cardiac Enzymes: No results found for this basename: CKTOTAL, CKMB, CKMBINDEX, TROPONINI,  in the last 168 hours BNP (last 3 results) No results found for this basename: PROBNP,  in the last 8760 hours CBG:  Recent Labs Lab 04/08/12 0714 04/08/12 1213 04/08/12 1638 04/08/12 2051 04/09/12 0804  GLUCAP 340* 190* 235* 264* 128*    Recent Results (from the past 240 hour(s))  CULTURE, BLOOD (ROUTINE X 2)     Status: None   Collection Time    04/08/12  8:30 AM      Result Value Range Status   Specimen Description BLOOD RIGHT  ANTECUBITAL   Final   Special Requests BOTTLES DRAWN AEROBIC AND ANAEROBIC 4 CC EA   Final   Culture  Setup Time 04/08/2012 15:22   Final   Culture     Final   Value:        BLOOD CULTURE RECEIVED NO GROWTH TO DATE CULTURE WILL BE HELD FOR 5 DAYS BEFORE ISSUING A FINAL NEGATIVE REPORT   Report Status  PENDING   Incomplete  CULTURE, BLOOD (ROUTINE X 2)     Status: None   Collection Time    04/08/12  9:11 AM      Result Value Range Status   Specimen Description BLOOD RIGHT HAND  1 ML IN [facility] BOTTLE   Final   Special Requests NONE   Final   Culture  Setup Time 04/08/2012 15:22   Final   Culture     Final   Value:        BLOOD CULTURE RECEIVED NO GROWTH TO DATE CULTURE WILL BE HELD FOR 5 DAYS BEFORE ISSUING A FINAL NEGATIVE REPORT   Report Status PENDING   Incomplete  MRSA PCR SCREENING     Status: None   Collection Time    04/08/12 11:43 AM      Result Value Range Status   MRSA by PCR NEGATIVE  NEGATIVE Final   Comment:            The GeneXpert MRSA Assay (FDA     approved for NASAL specimens     only), is one component of a     comprehensive MRSA colonization     surveillance program. It is not     intended to diagnose MRSA     infection nor to guide or     monitor treatment for     MRSA infections.     Studies: Ct Head Wo Contrast  04/08/2012  *RADIOLOGY REPORT*  Clinical Data: Altered mental status.  CT HEAD WITHOUT CONTRAST  Technique:  Contiguous axial images were obtained from the base of the skull through the vertex without contrast.  Comparison: 07/30/2009  Findings: Postsurgical changes in the right frontal lobe.  These are stable since prior study.  Atrophy.  Associated ventriculomegaly, likely related to ex vacuo dilatation.  No acute infarction or hemorrhage.  No extra-axial fluid collection.  No acute calvarial abnormality. Visualized paranasal sinuses and mastoids clear.  Orbital soft tissues unremarkable.  IMPRESSION: No acute intracranial abnormality.  No change since  prior study.   Original Report Authenticated By: Charlett Nose, M.D.    Dg Chest Port 1 View  04/08/2012  *RADIOLOGY REPORT*  Clinical Data: Altered mental status, fever.  PORTABLE CHEST - 1 VIEW  Comparison: 03/02/2012  Findings: New opacity noted in the left retrocardiac region concerning for pneumonia.  Low lung volumes.  Right lung is clear. Heart is normal size.  No visible effusions or acute bony abnormality.  IMPRESSION: Developing opacity at the left lung base concerning for pneumonia.  Low lung volumes.   Original Report Authenticated By: Charlett Nose, M.D.     Scheduled Meds: . sodium chloride   Intravenous STAT  . ceFEPime (MAXIPIME) IV  1 g Intravenous Q8H  . docusate  100 mg Per Tube BID  . enoxaparin (LOVENOX) injection  40 mg Subcutaneous Q24H  . famotidine (PEPCID) IV  20 mg Intravenous Q12H  . feeding supplement (JEVITY 1.2 CAL)  237 mL Per Tube QID  . fenofibrate  160 mg Oral Daily  . folic acid  1 mg Per Tube Daily  . insulin aspart  0-15 Units Subcutaneous TID WC  . insulin aspart  0-5 Units Subcutaneous QHS  . insulin detemir  55 Units Subcutaneous BID  . levofloxacin (LEVAQUIN) IV  750 mg Intravenous Q24H  . multivitamin with minerals  1 tablet Per Tube Daily  . PARoxetine  40 mg Per Tube q morning - 10a  . potassium chloride SA  40  mEq Oral Once  . thiamine  100 mg Per Tube Daily   Or  . thiamine  100 mg Intravenous Daily  . vancomycin  1,000 mg Intravenous Q12H   Continuous Infusions: . sodium chloride 1,000 mL (04/08/12 1240)  . sodium chloride 75 mL/hr at 04/09/12 1610    Active Problems:   Constipation   Fever   Anxiety and depression   Dyslipidemia   Diabetes mellitus   Sinus tachycardia   HCAP (healthcare-associated pneumonia)   Normocytic anemia   Hypokalemia   Metabolic acidosis, normal anion gap (NAG)   Hypocalcemia   Hypomagnesemia   Protein-calorie malnutrition, severe   Encephalopathy, metabolic    Time spent: 30 min    SHORT,  [facility]  [facility] Pager [phone]. If 7PM-7AM, please contact night-coverage at [email or web address], password [facility] 04/09/2012, 10:43 AM  LOS: 1 day

## 2012-04-09 NOTE — Care Management Note (Addendum)
    Page 1 of 2   04/20/2012     2:58:10 PM   CARE MANAGEMENT NOTE 04/20/2012  Patient:  [facility] A   Account Number:  [id number]  Date Initiated:  04/09/2012  Documentation initiated by:  [facility]  Subjective/Objective Assessment:   ADMITTED W/CONFUSION.SINUS TACHYCARDIA.ZO:[id number] CA.     Action/Plan:   FROM HOME W/SPOUSE.HAS DME-PEG SUPPLIES,HOYER LIFT FROM [facility].   Anticipated DC Date:  04/23/2012   Anticipated DC Plan:  HOME W HOME HEALTH SERVICES      DC Planning Services  CM consult      [facility] Choice  Resumption Of Svcs/PTA Provider   Choice offered to / List presented to:  C-3 Spouse        HH arranged  HH-1 RN  HH-6 SOCIAL WORKER  HH-4 NURSE'S AIDE      HH agency  [facility]   Status of service:  In process, will continue to follow Medicare Important Message given?   (If response is "NO", the following Medicare IM given date fields will be blank) Date Medicare IM given:   Date Additional Medicare IM given:    Discharge Disposition:    Per UR Regulation:  Reviewed for med. necessity/level of care/duration of stay  If discussed at Long Length of Stay Meetings, dates discussed:   04/19/2012    Comments:  04/20/12 KATHY MAHABIR RN,BSN NCM [phone] TRANSFER FROM SDU.PROVIDED SPOUSE W/NON EMERGENCY  MEDICAL TRANSPORT RESOURCES,& PCP MAKING HOME VISITS.[facility] KRISTEN(REP)FOLLOWING FOR HH-RN/SW/AIDE.FOR HOME TUBE FEEDINGS-PLEASE PUT IN SPECIFIC ORDERS FOR TUBE FEEDS,ESPECIALLY SINCE SPOUSE NEEDS SPECIFIC DIRECTIONS TO FOLLOW ONCE TEACHING HAS BEEN DONE BY HHRN.NOT APPROPRIATE FOR SNF SINCE ALREADY BEDBOUND PTA.WILL NEED AMBULANCE TRANSP.PATIENT USES [facility] FOR-PRIVATE DUTY HELP-NURSE'S AIDE,NOT [facility].  04/16/12 KATHY MAHABIR RN,BSN NCM [phone] BECAME UNRESPONSIVE,DIAGNOSTIC W/U.PT-NOT APPROPRIATE FOR SNF.[facility] FOLLOWING FOR HHC SERVICES.  04/12/12 KATHY MAHABIR RN,BSN NCM [phone] MET ENCEPHALOPATHY,PNA,+BLD PICC IN  PLACE,CX,SEVERE PROTEIN MALNUTRITION-PEG.AWAIT ID CONS ON IV ABX.IF LONG TERM IV ABX NEEDED CAN ARRANGE.[facility] SUSAN(REP)ALREADY FOLLOWING FOR HHRN/SW.PT/OT-NO F/U SINCE ALREADY BEDBOUND PTA.PATIENT ALREADY HAS PRIVATE SITTERS FROM [facility] M-F 7:30A-4:30P.SPOKE TO SPOUSE JERRY C#[phone] ABOUT D/C PLANS-RETURN HOME W/HH/PRIVATE SITTERS/PEG/ AMBULANCE TRANSP.  04/11/12 KATHY MAHABIR RN,BSN NCM [phone] TC SPOUSE HOME TEL#[phone]-LEFT MESSAGE W/CALL BACK #.INFORMING SPOUSE OF INFO PROVIDED FOR HOME VISIT PCP LISTINGS,& MEDICAL TRANSP RESOURCES.SPOUSE PLANS TO TAKE PATIENT BACK HOME W/HH.[facility] FOLLOWING FOR HHRN-MEDS,TPN-INSTRUCTION,SW-HOME ENVIRONMENT,RESOURCES.AMBULANCE TRANSP NEEDED @ D/C.  04/10/12 KATHY MAHABIR RN,BSN NCM [phone] SPOKE TO [facility] SUSAN(REP) ABOUT PEG SUPPLIES VIA [facility] DME-THEY HAVE NUTRITIONIST THAT F/U ON NUTRITION & ONCE GOALS ARE MET W/INSTRUCTION ON FEEDING THEY NO LONGER SEE THE PATIENT,BUT DELIVERY OF SUPPLIES STILL ONGOING.RD-CONTINUOUS TF.METABOLIC ENCEPHALOPATHY,HCAP,BLOOD CX-GPC.IV ABX,IVF,IV PEPCID.[facility] FOLLOWING FOR HHRN,SW(IF COVERED BY INSURANCE).  04/09/12 KATHY MAHABIR RN,BSN NCM [phone] SPOKE TO SPOUSE ABOUT D/C PLAN, & SERVICES CURRENTLY @ HOME,& ALSO HOW IS HE MANAGING.SPOUSE HAS BEEN TRYING TO GET A PHYSICIAN TO MAKE HOME VISITS,& MEDICAL TRANSP FOR PCP VISITS.PROVIDED W/LISTING OF PCP FOR HOME VISITS,ALSO MEDICAL TRANSP LISTING.[facility] CHOSEN FOR HH SUSAN(REP) AWARE OF REFERRAL.PT/OT-NO F/U SINCE BEDBOUND.RECOMMEND HHRN,& SW ONLY IF COVERED BY INSURANCE(IF NOT COVERED SW CAN MAKE HOUSE CALL ON RESOURCES).WILL NEED AMBULANCE TRANSP @ D/C.

## 2012-04-09 NOTE — Progress Notes (Signed)
CSW met with patient and husband at bedside. Patient still unable to communicate effectively. CSW discussed list of concerns with patient's spouse regarding: patient hygiene, medication discrepancies, and concerns about patient getting appropriate nutrition/hydration, and general care concerns. He states that there is a sitter who comes Monday through Friday to care for patient while he is at work. Spouse stated that any lack of care is likely his fault and he will try harder. Spouse appears to have a desire to care for patient but CSW is concerned about his capability to do so. CSW discussed with spouse that CSW is going to recommend to MD that a social worker come to their home upon discharge to help assess where the downfalls are and assist with any needed referrals/resources. Spouse is agreeable to same. CSW discussed concerns and recommendations with Dr. Judie Petit. Short. CSW will continue to follow.  Bethany C. Corcoran MSW, LCSW [phone]

## 2012-04-09 NOTE — Progress Notes (Signed)
INITIAL NUTRITION ASSESSMENT  DOCUMENTATION CODES Per approved criteria  -Obesity Unspecified   INTERVENTION: Pt currently receiving 237 ml of Jevity 1.2 four times daily. This provides 1138 kcal, 53 grams of protein, and 768 ml water. This meets 69% of estimated energy needs and 68% of estimated protein needs.  Recommend changing Tube feeds: Initiate Jevity 1.2 @ 20 ml/hr via PEG and increase by 10 ml every 4 hours to goal rate of 55 ml/hr. At goal rate, tube feeding regimen will provide 1584 kcal, 73 grams of protein, and 1070 ml of H2O. Recommend 200 ml free water flushes q 4 hrs.   NUTRITION DIAGNOSIS: Inadequate oral intake related to dysphagia as evidenced by NPO status and PEG tube in place.   Goal: Pt to meet >/= 90% of their estimated nutrition needs  Monitor:  Wt TF frequency Po intake  Reason for Assessment: Consult  72 y.o. female  Admitting Dx: Altered mental status and fever  ASSESSMENT: 72 y.o. year-old female with history of CP, possible MR, Parkinsonism, T2DM, renal cancer, dysphagia with recurrent aspiration pneumonia, bladder spasms who presents with altered mental status and fever. The patient was last at their baseline health until the last two days. Per her husband who is her primary care taker, she had subjective fevers yesterday and has been sleepier than usual. Pt difficult to understand and unable to answer questions clearly at time of visit. Per wt history pt has maintained wt for the past month at 172 lbs. Pt reports that she eats by mouth sometimes. Per RN, husband reported pt was getting continuous feeds at home (maybe 41ml/hr) and was getting some sweet tea by mouth but, nothing else. Unable to get in touch with pt's husband to confirm details of tube feedings despite multiple attempts to call him.  Height: Ht Readings from Last 1 Encounters:  04/08/12 5\' 3"  (1.6 m)    Weight: Wt Readings from Last 1 Encounters:  04/08/12 172 lb 9.9 oz (78.3 kg)     Ideal Body Weight: 115 lb   % Ideal Body Weight: 150%  Wt Readings from Last 10 Encounters:  04/08/12 172 lb 9.9 oz (78.3 kg)  03/05/12 172 lb 11.2 oz (78.336 kg)    Usual Body Weight: unknown  % Usual Body Weight: NA  BMI:  Body mass index is 30.59 kg/(m^2).  Estimated Nutritional Needs: Kcal: 1650-1800 Protein: 78-94 grams Fluid: >2.3 L  Skin: WDL; PEG in place  Diet Order: NPO  EDUCATION NEEDS: -No education needs identified at this time   Intake/Output Summary (Last 24 hours) at 04/09/12 1436 Last data filed at 04/09/12 1427  Gross per 24 hour  Intake 2792.5 ml  Output    600 ml  Net 2192.5 ml    Last BM: 3/3  Labs:   Recent Labs Lab 04/08/12 0830 04/08/12 1700 04/09/12 0400 04/09/12 0500  NA 142 137 135  --   K 2.5* 4.6 3.9  --   CL 114* 105 103  --   CO2 17* 19 23  --   BUN 18 24* 20  --   CREATININE 0.36* 0.47* 0.50  --   CALCIUM 5.8* 8.8 8.8  --   MG 1.2*  --   --  2.0  PHOS  --  2.3 2.1*  --   GLUCOSE 232* 260* 182*  --     CBG (last 3)   Recent Labs  04/08/12 2051 04/09/12 0804 04/09/12 1202  GLUCAP 264* 128* 212*    Scheduled  Meds: . ceFEPime (MAXIPIME) IV  1 g Intravenous Q8H  . docusate  100 mg Per Tube BID  . enoxaparin (LOVENOX) injection  40 mg Subcutaneous Q24H  . famotidine (PEPCID) IV  20 mg Intravenous Q12H  . feeding supplement (JEVITY 1.2 CAL)  237 mL Per Tube QID  . fenofibrate  160 mg Oral Daily  . [START ON 04/10/2012] ferrous sulfate  300 mg Per Tube Q breakfast  . folic acid  1 mg Per Tube Daily  . insulin aspart  0-15 Units Subcutaneous TID WC  . insulin aspart  0-5 Units Subcutaneous QHS  . insulin detemir  60 Units Subcutaneous BID  . levofloxacin (LEVAQUIN) IV  750 mg Intravenous Q24H  . multivitamin with minerals  1 tablet Per Tube Daily  . PARoxetine  40 mg Per Tube q morning - 10a  . potassium chloride SA  40 mEq Oral Once  . thiamine  100 mg Per Tube Daily   Or  . thiamine  100 mg Intravenous  Daily  . vancomycin  1,000 mg Intravenous Q12H    Continuous Infusions: . sodium chloride 1,000 mL (04/08/12 1240)    Past Medical History  Diagnosis Date  . Diabetes mellitus   . Renal disorder   . CP (cerebral palsy)   . Renal cancer     s/p nephrectomy  . Parkinsonism   . Dysphagia   . Recurrent aspiration pneumonia   . Bladder spasms   . Amputation of arm   . Recurrent UTI     Past Surgical History  Procedure Laterality Date  . 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 Peg tube placement    . Knee surgery    . Nephrectomy      Ian Malkin RD, LDN Inpatient Clinical Dietitian Pager: [phone] After Hours Pager: [phone]

## 2012-04-10 LAB — CBC
HCT: 35.1 % — ABNORMAL LOW (ref 36.0–46.0)
Hemoglobin: 11.4 g/dL — ABNORMAL LOW (ref 12.0–15.0)
MCH: 28.9 pg (ref 26.0–34.0)
MCHC: 32.5 g/dL (ref 30.0–36.0)

## 2012-04-10 LAB — RENAL FUNCTION PANEL
CO2: 20 mEq/L (ref 19–32)
Calcium: 8.9 mg/dL (ref 8.4–10.5)
Creatinine, Ser: 0.48 mg/dL — ABNORMAL LOW (ref 0.50–1.10)
Glucose, Bld: 319 mg/dL — ABNORMAL HIGH (ref 70–99)

## 2012-04-10 LAB — GLUCOSE, CAPILLARY
Glucose-Capillary: 173 mg/dL — ABNORMAL HIGH (ref 70–99)
Glucose-Capillary: 211 mg/dL — ABNORMAL HIGH (ref 70–99)

## 2012-04-10 MED ORDER — VANCOMYCIN HCL 1000 MG IV SOLR
750.0000 mg | Freq: Two times a day (BID) | INTRAVENOUS | Status: DC
  Administered 2012-04-10 – 2012-04-14 (×7): 750 mg via INTRAVENOUS
  Filled 2012-04-10 (×9): qty 750

## 2012-04-10 MED ORDER — AMLODIPINE BESYLATE 5 MG PO TABS: 5.0000 mg | ORAL_TABLET | Freq: Every day | ORAL | Status: DC

## 2012-04-10 MED ORDER — INSULIN ASPART 100 UNIT/ML ~~LOC~~ SOLN
0.0000 [IU] | SUBCUTANEOUS | Status: DC
  Administered 2012-04-10 (×2): 7 [IU] via SUBCUTANEOUS
  Administered 2012-04-10: 4 [IU] via SUBCUTANEOUS
  Administered 2012-04-11: 11 [IU] via SUBCUTANEOUS
  Administered 2012-04-11: 7 [IU] via SUBCUTANEOUS
  Administered 2012-04-11: 11 [IU] via SUBCUTANEOUS
  Administered 2012-04-11 – 2012-04-12 (×6): 7 [IU] via SUBCUTANEOUS
  Administered 2012-04-12: 11 [IU] via SUBCUTANEOUS
  Administered 2012-04-12: 7 [IU] via SUBCUTANEOUS
  Administered 2012-04-12: 4 [IU] via SUBCUTANEOUS
  Administered 2012-04-12: 7 [IU] via SUBCUTANEOUS
  Administered 2012-04-13 (×2): 4 [IU] via SUBCUTANEOUS
  Administered 2012-04-13: 7 [IU] via SUBCUTANEOUS
  Administered 2012-04-13: 4 [IU] via SUBCUTANEOUS
  Administered 2012-04-13: 3 [IU] via SUBCUTANEOUS
  Administered 2012-04-14 (×2): 7 [IU] via SUBCUTANEOUS
  Administered 2012-04-14 (×3): 4 [IU] via SUBCUTANEOUS
  Administered 2012-04-14: 11 [IU] via SUBCUTANEOUS
  Administered 2012-04-15 (×4): 4 [IU] via SUBCUTANEOUS
  Administered 2012-04-15: 7 [IU] via SUBCUTANEOUS
  Administered 2012-04-15 – 2012-04-16 (×4): 4 [IU] via SUBCUTANEOUS
  Administered 2012-04-16: 3 [IU] via SUBCUTANEOUS

## 2012-04-10 MED ORDER — JEVITY 1.2 CAL PO LIQD
1000.0000 mL | ORAL | Status: DC
  Administered 2012-04-10 – 2012-04-15 (×6): 1000 mL
  Filled 2012-04-10 (×8): qty 1000

## 2012-04-10 MED ORDER — AMLODIPINE BESYLATE 5 MG PO TABS
5.0000 mg | ORAL_TABLET | Freq: Every day | ORAL | Status: DC
  Administered 2012-04-10 – 2012-04-19 (×10): 5 mg
  Filled 2012-04-10 (×11): qty 1

## 2012-04-10 MED ORDER — FREE WATER
200.0000 mL | Status: DC
  Administered 2012-04-10 – 2012-04-15 (×30): 200 mL

## 2012-04-10 NOTE — Progress Notes (Signed)
ANTIBIOTIC CONSULT NOTE - FOLLOW UP  Pharmacy Consult for Vanc x 8d Indication: HCAP  Allergies  Allergen Reactions  . Codeine Nausea And Vomiting  . Morphine And Related Nausea And Vomiting    Patient Measurements: Height: 5\' 3"  (160 cm) Weight: 172 lb 9.9 oz (78.3 kg) IBW/kg (Calculated) : 52.4  Vital Signs: Temp: 98.7 F (37.1 C) (03/04 1435) Temp src: Oral (03/04 1435) BP: 155/67 mmHg (03/04 1435) Pulse Rate: 101 (03/04 1435) Intake/Output from previous day: 03/03 0701 - 03/04 0700 In: 3775 [I.V.:2875; IV Piggyback:800] Out: -  Intake/Output from this shift: Total I/O In: 1225 [I.V.:1125; IV Piggyback:100] Out: -   Labs:  Recent Labs  04/08/12 0830 04/08/12 1700 04/09/12 0400 04/09/12 0500 04/10/12 0445 04/10/12 0500  WBC 5.9  --   --  7.7 5.7  --   HGB 8.8*  --   --  11.0* 11.4*  --   PLT 191  --   --  229 214  --   CREATININE 0.36* 0.47* 0.50  --   --  0.48*   Estimated Creatinine Clearance: 65.8 ml/min (by C-G formula based on Cr of 0.48).  Recent Labs  04/10/12 0810  VANCOTROUGH 22.7*    Assessment:  71 yof on Day#3 Vancomycin, Levaquin, and Cefepime for HCAP.  Patient is afebrile, WBC wnl, Scr low at 0.48 for CG CrCl of 66 ml/min and normalized CrCl of 75 ml/min.    Blood cultures from 3/2 is growing 1/2 GPC in clusters, urine cx negative, repeat blood cultures 3/4 pending.    Vancomycin trough this morning obtained 1.5 hours early for a trough of 22.7 on Vancomycin 1gm IV q12h.  Vanc was stopped this morning with the intention of re-checking a vancomycin level tonight but after extrapolating the trough, the true vancomycin trough is around 20.7 (slightly above therapeutic).   Goal of Therapy:  Vancomycin trough level 15-20 mcg/ml  Plan:   Reduce Vancomycin to 750 mg IV q12h to start now  Continue Levaquin and Cefepime as ordered  Pharmacy will f/u  Geoffry Paradise, PharmD, BCPS Pager: [phone] 3:32 PM Pharmacy #: [id number]

## 2012-04-10 NOTE — Progress Notes (Signed)
Lab called with blood culture results. Positive blood cultures, Gram positive cocci in clusters. Notified Donnamarie Poag, NP on call. No new orders received at this time. Will continue to monitor pt.

## 2012-04-10 NOTE — Progress Notes (Signed)
[facility] PROGRESS NOTE  [name] MVH:[id number] DOB: Mar 22, 1940 DOA: 04/08/2012 PCP: No primary provider on file.  Assessment/Plan  Metabolic encephalopathy in the setting of baseline MR.  Most likely due to pneumonia, metabolic acidosis, or electrolyte disturbance (low magnesium, potassium, calcium), and possible bacteremia.  Resolved this on 3/3.  Appears hydrated and electrolytes in normal range.  - Continue tx pneumonia/ possible bacteremia - Minimize narcotics and sedating medications   Healthcare associated pneumonia, although may have aspiration pneumonia also as this is a recurrent problem. Was hospitalized < 2 months ago.   - Flu PCR neg - Urine legionella neg -  step pneumonia Ag neg - Vanc, cefepime, levofloxacin day 3 -  Consider transitioning to clinda/cipro if blood culture turns out to be contaminate  Positive blood culture: - blood cultures 3/2, one of two bottles positive for GPC in clusters.   - BCx 3/4 pending - May need ECHO   Sinus tachycardia, likely related to pneumonia, and possibly sepsis, resolving.  No red alarms on telemetry so discontinued.    Dyslipidemia, stable. Continue fibrate   Anxiety and depression:  Hold clonazepam for now as no benzos on UDS.  No signs of withdrawal, CIWA scores 0.  Pharmacist verified that patient has been having prescription filled monthly for the last 4 months. - CIWA protocol  - Continue paxil   Elevated blood pressures, may be related to acute illness -  Add hydralazine prn -  Add norvasc  T2DM: A1c 8.4 02/2012  Fingersticks elevated yesterday  - Increase levemir to 60 units BID  - Change to SSI insulin moderate q4h   Discrepancy between medication list and UDS - also concerned that patient may not be receiving appropriate tube feeds.  Concern for hygiene.  - Appreciate social work assistance   Hypokalemia, likely due to imbalanced tube feeds, resolved with IV potassium chloride, now mild  hyperkalemia  Severe protein calorie malnutrition with albumin 1.9  - Nutrition consultation pending - Continue jevity tube feeds with free water flushes   Normocytic anemia, no evidence of acute bleeding, hgb considerably lower than baseline of 11mg /dl, now normalized this morning despite no transfusion.   - Occult stool  - Anemia w/u with AM labs:  Iron level low normal range, saturation below normal and ferritin only 24.  Likely component of iron deficiency.   -  Start daily iron -  TSH and B12 wnl -  Folate wnl  Diet: Jevity 1.2 to advance to 20ml/h with free water q4h Access: PIV  IVF:  OFF  Proph: lovenox   Code Status: full  Family Communication: spoke with patient and her husband  Disposition Plan:  Continue IV antibiotics.  Appreciate PT/OT/Nutrition assistance.  Likely D/c to home with RN, SW, and aid.   Consultants:  none  Procedures:  none  Antibiotics:  vanc 3/2 >>  Cefepime 3/2 >>  Levofloxacin 3/2 >>   HPI/Subjective:  States that she feels stronger and overall better today.  Persistent mild sinus congestion without sore throat.  Denies cough, shortness of breath, chest pain.  Tolerating tube feeds without complication.  Having BMs and voiding well.    Objective: Filed Vitals:   04/09/12 0615 04/09/12 1334 04/09/12 2151 04/10/12 0544  BP: 159/68 147/57 148/63 178/83  Pulse: 99 105 103 100  Temp: 98.8 F (37.1 C) 98.6 F (37 C) 98.3 F (36.8 C) 97.2 F (36.2 C)  TempSrc: Oral Oral Oral   Resp: 20 18 19    Height:  Weight:      SpO2: 99% 100% 98% 100%    Intake/Output Summary (Last 24 hours) at 04/10/12 1043 Last data filed at 04/10/12 0600  Gross per 24 hour  Intake   3775 ml  Output      0 ml  Net   3775 ml   Filed Weights   04/08/12 1300  Weight: 78.3 kg (172 lb 9.9 oz)    Exam:   General:  Overweight CF, No acute distress  HEENT:  NCAT, MMM, dry lips  Cardiovascular:  RRR, nl S1, S2 no mrg, 2+ pulses, warm  extremities  Respiratory:  CTAB, no increased WOB  Abdomen:   NABS, soft, NT/ND, PEG tube site c/d/i  MSK:   Decreased tone and bulk, trace LEE, left forearm amputated  Neuro:  Alert and conversant.  Able to answer questions and follow commands.  Decreased tone and bulk, NO movement in lower extremities and upper extremities 4/5.  Able to turn head side to side more easily today.    Data Reviewed: Basic Metabolic Panel:  Recent Labs Lab 04/08/12 0830 04/08/12 1700 04/09/12 0400 04/09/12 0500 04/10/12 0500  NA 142 137 135  --  136  K 2.5* 4.6 3.9  --  5.2*  CL 114* 105 103  --  104  CO2 17* 19 23  --  20  GLUCOSE 232* 260* 182*  --  319*  BUN 18 24* 20  --  16  CREATININE 0.36* 0.47* 0.50  --  0.48*  CALCIUM 5.8* 8.8 8.8  --  8.9  MG 1.2*  --   --  2.0 2.1  PHOS  --  2.3 2.1*  --  2.8   Liver Function Tests:  Recent Labs Lab 04/08/12 0830 04/08/12 1700 04/09/12 0400 04/10/12 0500  AST 18  --   --   --   ALT 14  --   --   --   ALKPHOS 44  --   --   --   BILITOT 0.2*  --   --   --   PROT 4.8*  --   --   --   ALBUMIN 1.9* 2.7* 2.8* 2.7*   No results found for this basename: LIPASE, AMYLASE,  in the last 168 hours  Recent Labs Lab 04/08/12 0830  AMMONIA 18   CBC:  Recent Labs Lab 04/08/12 0830 04/09/12 0500 04/10/12 0445  WBC 5.9 7.7 5.7  NEUTROABS 3.6  --   --   HGB 8.8* 11.0* 11.4*  HCT 27.0* 33.8* 35.1*  MCV 90.9 90.1 89.1  PLT 191 229 214   Cardiac Enzymes: No results found for this basename: CKTOTAL, CKMB, CKMBINDEX, TROPONINI,  in the last 168 hours BNP (last 3 results) No results found for this basename: PROBNP,  in the last 8760 hours CBG:  Recent Labs Lab 04/09/12 1202 04/09/12 1608 04/09/12 2207 04/09/12 2346 04/10/12 0749  GLUCAP 212* 300* 215* 227* 259*    Recent Results (from the past 240 hour(s))  CULTURE, BLOOD (ROUTINE X 2)     Status: None   Collection Time    04/08/12  8:30 AM      Result Value Range Status    Specimen Description BLOOD RIGHT ANTECUBITAL   Final   Special Requests BOTTLES DRAWN AEROBIC AND ANAEROBIC 4 CC EA   Final   Culture  Setup Time 04/08/2012 15:22   Final   Culture     Final   Value:  BLOOD CULTURE RECEIVED NO GROWTH TO DATE CULTURE WILL BE HELD FOR 5 DAYS BEFORE ISSUING A FINAL NEGATIVE REPORT   Report Status PENDING   Incomplete  URINE CULTURE     Status: None   Collection Time    04/08/12  8:52 AM      Result Value Range Status   Specimen Description URINE, CATHETERIZED   Final   Special Requests NONE   Final   Culture  Setup Time 04/08/2012 16:32   Final   Colony Count NO GROWTH   Final   Culture NO GROWTH   Final   Report Status 04/09/2012 FINAL   Final  CULTURE, BLOOD (ROUTINE X 2)     Status: None   Collection Time    04/08/12  9:11 AM      Result Value Range Status   Specimen Description BLOOD RIGHT HAND  1 ML IN [place] BOTTLE   Final   Special Requests NONE   Final   Culture  Setup Time 04/08/2012 15:22   Final   Culture     Final   Value: GRAM POSITIVE COCCI IN CLUSTERS     Note: Gram Stain Report Called to,Read Back By and Verified With: KRISTY SYKES ON 04/09/2012 AT 9:33P BY WILEJ   Report Status PENDING   Incomplete  MRSA PCR SCREENING     Status: None   Collection Time    04/08/12 11:43 AM      Result Value Range Status   MRSA by PCR NEGATIVE  NEGATIVE Final   Comment:            The GeneXpert MRSA Assay (FDA     approved for NASAL specimens     only), is one component of a     comprehensive MRSA colonization     surveillance program. It is not     intended to diagnose MRSA     infection nor to guide or     monitor treatment for     MRSA infections.     Studies: No results found.  Scheduled Meds: . amLODipine  5 mg Per Tube Daily  . ceFEPime (MAXIPIME) IV  1 g Intravenous Q8H  . docusate  100 mg Per Tube BID  . enoxaparin (LOVENOX) injection  40 mg Subcutaneous Q24H  . famotidine (PEPCID) IV  20 mg Intravenous Q12H  . fenofibrate   160 mg Oral Daily  . ferrous sulfate  300 mg Per Tube Q breakfast  . folic acid  1 mg Per Tube Daily  . free water  200 mL Per Tube Q4H  . insulin aspart  0-20 Units Subcutaneous Q4H  . insulin detemir  60 Units Subcutaneous BID  . levofloxacin (LEVAQUIN) IV  750 mg Intravenous Q24H  . multivitamin with minerals  1 tablet Per Tube Daily  . PARoxetine  40 mg Per Tube q morning - 10a  . thiamine  100 mg Per Tube Daily   Or  . thiamine  100 mg Intravenous Daily   Continuous Infusions: . sodium chloride 1,000 mL (04/10/12 0230)  . feeding supplement (JEVITY 1.2 CAL)      Active Problems:   Constipation   Fever   Anxiety and depression   Dyslipidemia   Diabetes mellitus   Sinus tachycardia   HCAP (healthcare-associated pneumonia)   Normocytic anemia   Hypokalemia   Metabolic acidosis, normal anion gap (NAG)   Hypocalcemia   Hypomagnesemia   Protein-calorie malnutrition, severe   Encephalopathy, metabolic   Elevated  blood pressure    Time spent: 30 min    [name], [name]  [facility] Pager [phone]. If 7PM-7AM, please contact night-coverage at [email or web address], password [facility] 04/10/2012, 10:43 AM  LOS: 2 days

## 2012-04-11 ENCOUNTER — Inpatient Hospital Stay (HOSPITAL_COMMUNITY): Payer: 59

## 2012-04-11 LAB — RENAL FUNCTION PANEL
Albumin: 2.8 g/dL — ABNORMAL LOW (ref 3.5–5.2)
BUN: 13 mg/dL (ref 6–23)
CO2: 22 mEq/L (ref 19–32)
Calcium: 9.4 mg/dL (ref 8.4–10.5)
Chloride: 102 mEq/L (ref 96–112)
Creatinine, Ser: 0.45 mg/dL — ABNORMAL LOW (ref 0.50–1.10)
GFR calc Af Amer: >90 mL/min (ref >90)
GFR calc non Af Amer: >90 mL/min (ref >90)
Glucose, Bld: 261 mg/dL — ABNORMAL HIGH (ref 70–99)
Phosphorus: 2.8 mg/dL (ref 2.3–4.6)
Potassium: 4.1 mEq/L (ref 3.5–5.1)
Sodium: 136 mEq/L (ref 135–145)

## 2012-04-11 LAB — CBC
HCT: 34.4 % — ABNORMAL LOW (ref 36.0–46.0)
Hemoglobin: 11.1 g/dL — ABNORMAL LOW (ref 12.0–15.0)
MCH: 29.1 pg (ref 26.0–34.0)
MCHC: 32.3 g/dL (ref 30.0–36.0)
MCV: 90.1 fL (ref 78.0–100.0)
Platelets: 219 10*3/uL (ref 150–400)
RBC: 3.82 MIL/uL — ABNORMAL LOW (ref 3.87–5.11)
RDW: 15.2 % (ref 11.5–15.5)
WBC: 6 10*3/uL (ref 4.0–10.5)

## 2012-04-11 LAB — GLUCOSE, CAPILLARY
Glucose-Capillary: 228 mg/dL — ABNORMAL HIGH (ref 70–99)
Glucose-Capillary: 223 mg/dL — ABNORMAL HIGH (ref 70–99)
Glucose-Capillary: 269 mg/dL — ABNORMAL HIGH (ref 70–99)
Glucose-Capillary: 270 mg/dL — ABNORMAL HIGH (ref 70–99)

## 2012-04-11 LAB — MAGNESIUM: Magnesium: 2 mg/dL (ref 1.5–2.5)

## 2012-04-11 MED ORDER — SODIUM CHLORIDE 0.9 % IJ SOLN
10.0000 mL | INTRAMUSCULAR | Status: DC | PRN
  Administered 2012-04-14: 10 mL

## 2012-04-11 MED ORDER — PROMETHAZINE HCL 25 MG/ML IJ SOLN
12.5000 mg | Freq: Once | INTRAMUSCULAR | Status: AC
  Administered 2012-04-11: 12.5 mg via INTRAMUSCULAR
  Filled 2012-04-11: qty 1

## 2012-04-11 MED ORDER — RANITIDINE HCL 150 MG/10ML PO SYRP
150.0000 mg | ORAL_SOLUTION | Freq: Every day | ORAL | Status: DC
  Administered 2012-04-11 – 2012-04-21 (×11): 150 mg
  Filled 2012-04-11 (×12): qty 10

## 2012-04-11 MED ORDER — DEXTROSE 5 % IV SOLN
1.0000 g | Freq: Three times a day (TID) | INTRAVENOUS | Status: DC
  Administered 2012-04-11 – 2012-04-14 (×8): 1 g via INTRAVENOUS
  Filled 2012-04-11 (×11): qty 1

## 2012-04-11 MED ORDER — ONDANSETRON HCL 4 MG/2ML IJ SOLN: 4.0000 mg | Freq: Four times a day (QID) | INTRAMUSCULAR | Status: DC | PRN

## 2012-04-11 MED ORDER — FAMOTIDINE 40 MG/5ML PO SUSR
20.0000 mg | Freq: Every day | ORAL | Status: DC
  Filled 2012-04-11: qty 2.5

## 2012-04-11 MED ORDER — PROMETHAZINE HCL 25 MG/ML IJ SOLN
12.5000 mg | Freq: Once | INTRAMUSCULAR | Status: AC
  Administered 2012-04-11: 12.5 mg via INTRAMUSCULAR

## 2012-04-11 MED ORDER — ONDANSETRON HCL 4 MG/2ML IJ SOLN
INTRAMUSCULAR | Status: AC
  Filled 2012-04-11: qty 2

## 2012-04-11 NOTE — Progress Notes (Signed)
[facility] PROGRESS NOTE  RIA REDCAY BJY:[id number] DOB: 1940/04/25 DOA: 04/08/2012 PCP: No primary Alexis Mizuno on file.  Assessment/Plan:  1) Metabolic Encephalopathy in patient with MR - Most likely multifactorial given Pneumonia and per initial reports metabolic acidosis and possible bacteremia - Will plan on discussing with family to assess baseline mentation. Patient alert and awake today - Initial CT of head interpreted as no acute intracranial abnormality  2. HAP - flu negative - Urine legionella neg  - step pneumonia Ag neg - Will continue current broad spectrum antibiotics - initial chest x ray interpreted as Developing opacity at the left lung base concerning for pneumonia  3. Positive blood culture - could likely be contaminant.  Will await for results of repeat blood cultures - consider discussing with ID for further recommendations likely next am - patient is non toxic appearing  4. Hyperkalemia - Resolved with IVF's - will continue to monitor  5. Anxiety and depression - continue home regimen paxil  6. Elevated BP's - improved with norvasc on board. - hydralazine prn - continue to monitor bp's  7. DM II - continue to monitor blood sugars - Levemir increased to 60 units BID, has had improved blood sugars on this regimen  8. Severe protein calorie malnutrition with albumin 1.9 - continue current tube feeds as listed below. - goal rate 55 ml/hr per dietitian recommendations  Diet: Jevity 1.2 to advance to 54ml/h with free water q4h  Access: PIV  IVF: OFF  Proph: lovenox   Code Status: full  Family Communication: spoke with patient and her husband  Disposition Plan: Continue IV antibiotics. Appreciate PT/OT/Nutrition assistance. Likely D/c to home with RN, SW, and aid.    Consultants:  Dietitian  Procedures:  none  Antibiotics:  Please see above  HPI/Subjective: No new complaints reported today.  Objective: Filed Vitals:    04/10/12 1435 04/10/12 2044 04/10/12 2300 04/11/12 0508  BP: 155/67 151/60  140/73  Pulse: 101 103  97  Temp: 98.7 F (37.1 C) 100.5 F (38.1 C) 98.8 F (37.1 C) 98.5 F (36.9 C)  TempSrc: Oral Oral Oral Oral  Resp: 18 20  16   Height:      Weight:      SpO2: 98% 99%  100%    Intake/Output Summary (Last 24 hours) at 04/11/12 1132 Last data filed at 04/11/12 1035  Gross per 24 hour  Intake 4257.92 ml  Output      0 ml  Net 4257.92 ml   Filed Weights   04/08/12 1300  Weight: 78.3 kg (172 lb 9.9 oz)    Exam:   General:  Pt in NAD, Alert and Awake  Cardiovascular: RRR, no MRG  Respiratory: no wheezes, breath sounds BL, no increased work of breathing  Abdomen: soft, NT, ND  Musculoskeletal: no cyanosis   Data Reviewed: Basic Metabolic Panel:  Recent Labs Lab 04/08/12 0830 04/08/12 1700 04/09/12 0400 04/09/12 0500 04/10/12 0500 04/11/12 0440  NA 142 137 135  --  136 136  K 2.5* 4.6 3.9  --  5.2* 4.1  CL 114* 105 103  --  104 102  CO2 17* 19 23  --  20 22  GLUCOSE 232* 260* 182*  --  319* 261*  BUN 18 24* 20  --  16 13  CREATININE 0.36* 0.47* 0.50  --  0.48* 0.45*  CALCIUM 5.8* 8.8 8.8  --  8.9 9.4  MG 1.2*  --   --  2.0 2.1 2.0  PHOS  --  2.3 2.1*  --  2.8 2.8   Liver Function Tests:  Recent Labs Lab 04/08/12 0830 04/08/12 1700 04/09/12 0400 04/10/12 0500 04/11/12 0440  AST 18  --   --   --   --   ALT 14  --   --   --   --   ALKPHOS 44  --   --   --   --   BILITOT 0.2*  --   --   --   --   PROT 4.8*  --   --   --   --   ALBUMIN 1.9* 2.7* 2.8* 2.7* 2.8*   No results found for this basename: LIPASE, AMYLASE,  in the last 168 hours  Recent Labs Lab 04/08/12 0830  AMMONIA 18   CBC:  Recent Labs Lab 04/08/12 0830 04/09/12 0500 04/10/12 0445 04/11/12 0440  WBC 5.9 7.7 5.7 6.0  NEUTROABS 3.6  --   --   --   HGB 8.8* 11.0* 11.4* 11.1*  HCT 27.0* 33.8* 35.1* 34.4*  MCV 90.9 90.1 89.1 90.1  PLT 191 229 214 219   Cardiac  Enzymes: No results found for this basename: CKTOTAL, CKMB, CKMBINDEX, TROPONINI,  in the last 168 hours BNP (last 3 results) No results found for this basename: PROBNP,  in the last 8760 hours CBG:  Recent Labs Lab 04/10/12 1707 04/10/12 2047 04/11/12 0039 04/11/12 0422 04/11/12 0750  GLUCAP 226* 211* 210* 237* 228*    Recent Results (from the past 240 hour(s))  CULTURE, BLOOD (ROUTINE X 2)     Status: None   Collection Time    04/08/12  8:30 AM      Result Value Range Status   Specimen Description BLOOD RIGHT ANTECUBITAL   Final   Special Requests BOTTLES DRAWN AEROBIC AND ANAEROBIC 4 CC EA   Final   Culture  Setup Time 04/08/2012 15:22   Final   Culture     Final   Value:        BLOOD CULTURE RECEIVED NO GROWTH TO DATE CULTURE WILL BE HELD FOR 5 DAYS BEFORE ISSUING A FINAL NEGATIVE REPORT   Report Status PENDING   Incomplete  URINE CULTURE     Status: None   Collection Time    04/08/12  8:52 AM      Result Value Range Status   Specimen Description URINE, CATHETERIZED   Final   Special Requests NONE   Final   Culture  Setup Time 04/08/2012 16:32   Final   Colony Count NO GROWTH   Final   Culture NO GROWTH   Final   Report Status 04/09/2012 FINAL   Final  CULTURE, BLOOD (ROUTINE X 2)     Status: None   Collection Time    04/08/12  9:11 AM      Result Value Range Status   Specimen Description BLOOD RIGHT HAND  1 ML IN [facility] BOTTLE   Final   Special Requests NONE   Final   Culture  Setup Time 04/08/2012 15:22   Final   Culture     Final   Value: GRAM POSITIVE COCCI IN CLUSTERS     Note: Gram Stain Report Called to,Read Back By and Verified With: KRISTY SYKES ON 04/09/2012 AT 9:33P BY WILEJ   Report Status PENDING   Incomplete  MRSA PCR SCREENING     Status: None   Collection Time    04/08/12 11:43 AM  Result Value Range Status   MRSA by PCR NEGATIVE  NEGATIVE Final   Comment:            The GeneXpert MRSA Assay (FDA     approved for NASAL specimens     only),  is one component of a     comprehensive MRSA colonization     surveillance program. It is not     intended to diagnose MRSA     infection nor to guide or     monitor treatment for     MRSA infections.  CULTURE, BLOOD (ROUTINE X 2)     Status: None   Collection Time    04/10/12  8:10 AM      Result Value Range Status   Specimen Description BLOOD LEFT ARM   Final   Special Requests BOTTLES DRAWN AEROBIC AND ANAEROBIC 5CC   Final   Culture  Setup Time 04/10/2012 13:46   Final   Culture     Final   Value:        BLOOD CULTURE RECEIVED NO GROWTH TO DATE CULTURE WILL BE HELD FOR 5 DAYS BEFORE ISSUING A FINAL NEGATIVE REPORT   Report Status PENDING   Incomplete  CULTURE, BLOOD (ROUTINE X 2)     Status: None   Collection Time    04/10/12  8:20 AM      Result Value Range Status   Specimen Description BLOOD LEFT ARM   Final   Special Requests BOTTLES DRAWN AEROBIC ONLY 1CC   Final   Culture  Setup Time 04/10/2012 13:46   Final   Culture     Final   Value:        BLOOD CULTURE RECEIVED NO GROWTH TO DATE CULTURE WILL BE HELD FOR 5 DAYS BEFORE ISSUING A FINAL NEGATIVE REPORT   Report Status PENDING   Incomplete     Studies: No results found.  Scheduled Meds: . amLODipine  5 mg Per Tube Daily  . ceFEPime (MAXIPIME) IV  1 g Intravenous Q8H  . docusate  100 mg Per Tube BID  . enoxaparin (LOVENOX) injection  40 mg Subcutaneous Q24H  . fenofibrate  160 mg Oral Daily  . ferrous sulfate  300 mg Per Tube Q breakfast  . folic acid  1 mg Per Tube Daily  . free water  200 mL Per Tube Q4H  . insulin aspart  0-20 Units Subcutaneous Q4H  . insulin detemir  60 Units Subcutaneous BID  . multivitamin with minerals  1 tablet Per Tube Daily  . PARoxetine  40 mg Per Tube q morning - 10a  . ranitidine  150 mg Per Tube Daily  . thiamine  100 mg Per Tube Daily   Or  . thiamine  100 mg Intravenous Daily  . vancomycin  750 mg Intravenous Q12H   Continuous Infusions: . sodium chloride 1,000 mL (04/10/12  2138)  . feeding supplement (JEVITY 1.2 CAL) 1,000 mL (04/11/12 0719)    Active Problems:   Constipation   Fever   Anxiety and depression   Dyslipidemia   Diabetes mellitus   Sinus tachycardia   HCAP (healthcare-associated pneumonia)   Normocytic anemia   Hypokalemia   Metabolic acidosis, normal anion gap (NAG)   Hypocalcemia   Hypomagnesemia   Protein-calorie malnutrition, severe   Encephalopathy, metabolic   Elevated blood pressure    Time spent: > 35 minutes    Penny Pia  [facility] Pager [phone] If 7PM-7AM, please contact night-coverage at [email or web address],  password [facility] 04/11/2012, 11:32 AM  LOS: 3 days

## 2012-04-11 NOTE — Progress Notes (Signed)
Attempted 3 times to contact husband, Dorothey Oetken, via phone without success for PICC consent.  I started a PIV in right posterior forearm using ultrasound to give her access until a PICC can be placed.

## 2012-04-11 NOTE — Progress Notes (Signed)
Peripherally Inserted Central Catheter/Midline Placement  The IV Nurse has discussed with the patient and/or persons authorized to consent for the patient, the purpose of this procedure and the potential benefits and risks involved with this procedure.  The benefits include less needle sticks, lab draws from the catheter and patient may be discharged home with the catheter.  Risks include, but not limited to, infection, bleeding, blood clot (thrombus formation), and puncture of an artery; nerve damage and irregular heat beat.  Alternatives to this procedure were also discussed.  PICC/Midline Placement Documentation  PICC / Midline Single Lumen 04/11/12 PICC Right Basilic (Active)       Christeen Douglas 04/11/2012, 8:03 PM

## 2012-04-12 LAB — GLUCOSE, CAPILLARY
Glucose-Capillary: 199 mg/dL — ABNORMAL HIGH (ref 70–99)
Glucose-Capillary: 208 mg/dL — ABNORMAL HIGH (ref 70–99)
Glucose-Capillary: 212 mg/dL — ABNORMAL HIGH (ref 70–99)
Glucose-Capillary: 215 mg/dL — ABNORMAL HIGH (ref 70–99)
Glucose-Capillary: 220 mg/dL — ABNORMAL HIGH (ref 70–99)
Glucose-Capillary: 224 mg/dL — ABNORMAL HIGH (ref 70–99)
Glucose-Capillary: 259 mg/dL — ABNORMAL HIGH (ref 70–99)

## 2012-04-12 LAB — RENAL FUNCTION PANEL
Albumin: 2.9 g/dL — ABNORMAL LOW (ref 3.5–5.2)
BUN: 15 mg/dL (ref 6–23)
Chloride: 101 mEq/L (ref 96–112)
GFR calc Af Amer: >90 mL/min (ref >90)
GFR calc non Af Amer: >90 mL/min (ref >90)
Phosphorus: 3.1 mg/dL (ref 2.3–4.6)
Potassium: 3.4 mEq/L — ABNORMAL LOW (ref 3.5–5.1)
Sodium: 137 mEq/L (ref 135–145)

## 2012-04-12 LAB — CULTURE, BLOOD (ROUTINE X 2)

## 2012-04-12 LAB — MAGNESIUM: Magnesium: 2 mg/dL (ref 1.5–2.5)

## 2012-04-12 LAB — CBC
MCV: 89.5 fL (ref 78.0–100.0)
Platelets: 256 10*3/uL (ref 150–400)
RBC: 3.9 MIL/uL (ref 3.87–5.11)
RDW: 15.7 % — ABNORMAL HIGH (ref 11.5–15.5)
WBC: 6.3 10*3/uL (ref 4.0–10.5)

## 2012-04-12 MED ORDER — ONDANSETRON HCL 4 MG/2ML IJ SOLN
4.0000 mg | Freq: Four times a day (QID) | INTRAMUSCULAR | Status: DC | PRN
  Administered 2012-04-12 – 2012-04-19 (×3): 4 mg via INTRAVENOUS
  Filled 2012-04-12 (×3): qty 2

## 2012-04-12 MED ORDER — POTASSIUM CHLORIDE 20 MEQ/15ML (10%) PO LIQD
40.0000 meq | Freq: Once | ORAL | Status: AC
  Administered 2012-04-12: 40 meq
  Filled 2012-04-12: qty 30

## 2012-04-12 MED ORDER — POTASSIUM CHLORIDE CRYS ER 20 MEQ PO TBCR
40.0000 meq | EXTENDED_RELEASE_TABLET | Freq: Once | ORAL | Status: DC
  Filled 2012-04-12: qty 2

## 2012-04-12 NOTE — Progress Notes (Signed)
[facility] PROGRESS NOTE  [name] ZOX:[id number] DOB: 13-May-1940 DOA: 04/08/2012 PCP: No primary [name] on file.  Assessment/Plan:  1) Metabolic Encephalopathy in patient with MR - Most likely multifactorial given Pneumonia and per initial reports metabolic acidosis and possible bacteremia - Initial CT of head interpreted as no acute intracranial abnormality - Suspect this has resolved.  2) HAP - flu negative - Urine legionella neg  - step pneumonia Ag neg - Will continue current broad spectrum antibiotics - initial chest x ray interpreted as Developing opacity at the left lung base concerning for pneumonia  3) Positive blood culture - could likely be contaminant.  Will await for results of repeat blood cultures - consider discussing with ID for further recommendations  - patient is non toxic appearing - continue current antibiotic regimen  4) Hyperkalemia - replace orally today  - will continue to monitor  5) Anxiety and depression - continue home regimen paxil  6) Elevated BP's - improved with norvasc on board. - hydralazine prn - continue to monitor bp's  7) DM II - continue to monitor blood sugars - Levemir increased to 60 units BID, has had improved blood sugars on this regimen - will reassess and consider increasing levemir should blood sugars continue to be in the 200's.    8) Severe protein calorie malnutrition with albumin 1.9 - continue current tube feeds as listed below. - goal rate 55 ml/hr per dietitian recommendations  Diet: Jevity 1.2 to 81ml/h with free water q4h  Access: PIV  IVF: OFF  Proph: lovenox   Code Status: full  Family Communication: spoke with patient and her husband  Disposition Plan: Continue IV antibiotics. Appreciate PT/OT/Nutrition assistance. Likely D/c to home with RN, SW, and aid.    Consultants:  Dietitian  Procedures:  none  Antibiotics:  Please see above  HPI/Subjective: Patient reports  feeling better today.  No acute issues reported overnight.  Objective: Filed Vitals:   04/11/12 0508 04/11/12 1528 04/11/12 2100 04/12/12 0536  BP: 140/73 149/67 154/77 157/56  Pulse: 97 116 116 96  Temp: 98.5 F (36.9 C) 98.6 F (37 C) 99.8 F (37.7 C) 98.3 F (36.8 C)  TempSrc: Oral Oral Oral Axillary  Resp: 16 20 18 18   Height:      Weight:      SpO2: 100% 98% 98% 98%    Intake/Output Summary (Last 24 hours) at 04/12/12 1243 Last data filed at 04/12/12 1050  Gross per 24 hour  Intake 3414.17 ml  Output      0 ml  Net 3414.17 ml   Filed Weights   04/08/12 1300  Weight: 78.3 kg (172 lb 9.9 oz)    Exam:   General:  Pt in NAD, Alert and Awake  Cardiovascular: RRR, no MRG  Respiratory: no wheezes, breath sounds BL, no increased work of breathing  Abdomen: soft, NT, ND  Musculoskeletal: no cyanosis   Data Reviewed: Basic Metabolic Panel:  Recent Labs Lab 04/08/12 0830 04/08/12 1700 04/09/12 0400 04/09/12 0500 04/10/12 0500 04/11/12 0440 04/12/12 0430  NA 142 137 135  --  136 136 137  K 2.5* 4.6 3.9  --  5.2* 4.1 3.4*  CL 114* 105 103  --  104 102 101  CO2 17* 19 23  --  20 22 25   GLUCOSE 232* 260* 182*  --  319* 261* 250*  BUN 18 24* 20  --  16 13 15   CREATININE 0.36* 0.47* 0.50  --  0.48* 0.45*  0.43*  CALCIUM 5.8* 8.8 8.8  --  8.9 9.4 9.8  MG 1.2*  --   --  2.0 2.1 2.0 2.0  PHOS  --  2.3 2.1*  --  2.8 2.8 3.1   Liver Function Tests:  Recent Labs Lab 04/08/12 0830 04/08/12 1700 04/09/12 0400 04/10/12 0500 04/11/12 0440 04/12/12 0430  AST 18  --   --   --   --   --   ALT 14  --   --   --   --   --   ALKPHOS 44  --   --   --   --   --   BILITOT 0.2*  --   --   --   --   --   PROT 4.8*  --   --   --   --   --   ALBUMIN 1.9* 2.7* 2.8* 2.7* 2.8* 2.9*   No results found for this basename: LIPASE, AMYLASE,  in the last 168 hours  Recent Labs Lab 04/08/12 0830  AMMONIA 18   CBC:  Recent Labs Lab 04/08/12 0830 04/09/12 0500  04/10/12 0445 04/11/12 0440 04/12/12 0430  WBC 5.9 7.7 5.7 6.0 6.3  NEUTROABS 3.6  --   --   --   --   HGB 8.8* 11.0* 11.4* 11.1* 11.3*  HCT 27.0* 33.8* 35.1* 34.4* 34.9*  MCV 90.9 90.1 89.1 90.1 89.5  PLT 191 229 214 219 256   Cardiac Enzymes: No results found for this basename: CKTOTAL, CKMB, CKMBINDEX, TROPONINI,  in the last 168 hours BNP (last 3 results) No results found for this basename: PROBNP,  in the last 8760 hours CBG:  Recent Labs Lab 04/12/12 0016 04/12/12 0408 04/12/12 0746 04/12/12 0813 04/12/12 1203  GLUCAP 224* 219* 212* 208* 216*    Recent Results (from the past 240 hour(s))  CULTURE, BLOOD (ROUTINE X 2)     Status: None   Collection Time    04/08/12  8:30 AM      Result Value Range Status   Specimen Description BLOOD RIGHT ANTECUBITAL   Final   Special Requests BOTTLES DRAWN AEROBIC AND ANAEROBIC 4 CC EA   Final   Culture  Setup Time 04/08/2012 15:22   Final   Culture     Final   Value:        BLOOD CULTURE RECEIVED NO GROWTH TO DATE CULTURE WILL BE HELD FOR 5 DAYS BEFORE ISSUING A FINAL NEGATIVE REPORT   Report Status PENDING   Incomplete  URINE CULTURE     Status: None   Collection Time    04/08/12  8:52 AM      Result Value Range Status   Specimen Description URINE, CATHETERIZED   Final   Special Requests NONE   Final   Culture  Setup Time 04/08/2012 16:32   Final   Colony Count NO GROWTH   Final   Culture NO GROWTH   Final   Report Status 04/09/2012 FINAL   Final  CULTURE, BLOOD (ROUTINE X 2)     Status: None   Collection Time    04/08/12  9:11 AM      Result Value Range Status   Specimen Description BLOOD RIGHT HAND  1 ML IN [facility] BOTTLE   Final   Special Requests NONE   Final   Culture  Setup Time 04/08/2012 15:22   Final   Culture     Final   Value: STAPHYLOCOCCUS SPECIES (COAGULASE NEGATIVE)  Note: THE SIGNIFICANCE OF ISOLATING THIS ORGANISM FROM A SINGLE SET OF BLOOD CULTURES WHEN MULTIPLE SETS ARE DRAWN IS UNCERTAIN. PLEASE  NOTIFY THE MICROBIOLOGY DEPARTMENT WITHIN ONE WEEK IF SPECIATION AND SENSITIVITIES ARE REQUIRED.     Note: Gram Stain Report Called to,Read Back By and Verified With: KRISTY SYKES ON 04/09/2012 AT 9:33P BY WILEJ   Report Status 04/12/2012 FINAL   Final  MRSA PCR SCREENING     Status: None   Collection Time    04/08/12 11:43 AM      Result Value Range Status   MRSA by PCR NEGATIVE  NEGATIVE Final   Comment:            The GeneXpert MRSA Assay (FDA     approved for NASAL specimens     only), is one component of a     comprehensive MRSA colonization     surveillance program. It is not     intended to diagnose MRSA     infection nor to guide or     monitor treatment for     MRSA infections.  CULTURE, BLOOD (ROUTINE X 2)     Status: None   Collection Time    04/10/12  8:10 AM      Result Value Range Status   Specimen Description BLOOD LEFT ARM   Final   Special Requests BOTTLES DRAWN AEROBIC AND ANAEROBIC 5CC   Final   Culture  Setup Time 04/10/2012 13:46   Final   Culture     Final   Value:        BLOOD CULTURE RECEIVED NO GROWTH TO DATE CULTURE WILL BE HELD FOR 5 DAYS BEFORE ISSUING A FINAL NEGATIVE REPORT   Report Status PENDING   Incomplete  CULTURE, BLOOD (ROUTINE X 2)     Status: None   Collection Time    04/10/12  8:20 AM      Result Value Range Status   Specimen Description BLOOD LEFT ARM   Final   Special Requests BOTTLES DRAWN AEROBIC ONLY 1CC   Final   Culture  Setup Time 04/10/2012 13:46   Final   Culture     Final   Value:        BLOOD CULTURE RECEIVED NO GROWTH TO DATE CULTURE WILL BE HELD FOR 5 DAYS BEFORE ISSUING A FINAL NEGATIVE REPORT   Report Status PENDING   Incomplete     Studies: Dg Chest Port 1 View  04/11/2012  *RADIOLOGY REPORT*  Clinical Data: PICC line placed  PORTABLE CHEST - 1 VIEW  Comparison: Chest radiograph 04/08/2012  Findings: Interval placement of a right PICC line with tip in the distal SVC.  There is no effusion, infiltrate, or pneumothorax.   IMPRESSION: Right PICC line in good position.   Original Report Authenticated By: Genevive Bi, M.D.     Scheduled Meds: . amLODipine  5 mg Per Tube Daily  . ceFEPime (MAXIPIME) IV  1 g Intravenous Q8H  . docusate  100 mg Per Tube BID  . enoxaparin (LOVENOX) injection  40 mg Subcutaneous Q24H  . fenofibrate  160 mg Oral Daily  . ferrous sulfate  300 mg Per Tube Q breakfast  . folic acid  1 mg Per Tube Daily  . free water  200 mL Per Tube Q4H  . insulin aspart  0-20 Units Subcutaneous Q4H  . insulin detemir  60 Units Subcutaneous BID  . multivitamin with minerals  1 tablet Per Tube Daily  . PARoxetine  40 mg Per Tube q morning - 10a  . ranitidine  150 mg Per Tube Daily  . thiamine  100 mg Per Tube Daily   Or  . thiamine  100 mg Intravenous Daily  . vancomycin  750 mg Intravenous Q12H   Continuous Infusions: . sodium chloride 1,000 mL (04/12/12 0545)  . feeding supplement (JEVITY 1.2 CAL) 1,000 mL (04/12/12 1610)    Active Problems:   Constipation   Fever   Anxiety and depression   Dyslipidemia   Diabetes mellitus   Sinus tachycardia   HCAP (healthcare-associated pneumonia)   Normocytic anemia   Hypokalemia   Metabolic acidosis, normal anion gap (NAG)   Hypocalcemia   Hypomagnesemia   Protein-calorie malnutrition, severe   Encephalopathy, metabolic   Elevated blood pressure    Time spent: > 35 minutes    Penny Pia  [facility] Pager [phone] If 7PM-7AM, please contact night-coverage at [email or web address], password [facility] 04/12/2012, 12:43 PM  LOS: 4 days

## 2012-04-12 NOTE — Progress Notes (Signed)
[facility]  Patient Status:  Active pt with [facility] up to the time of this admission.  [facility] is providing the following services: Mrs. Greear has DME and Enteral services with [facility] at the present time.  [facility] Coordinator, RN team, will follow pt to support D/C home for skilled services to include [facility] nursing and pharmacy services for home IV antibiotics as ordered.  Thank you.  If patient discharges after hours, please call [phone].   Sedalia Muta 04/12/2012, 12:13 PM

## 2012-04-13 LAB — CBC
HCT: 33.8 % — ABNORMAL LOW (ref 36.0–46.0)
Hemoglobin: 10.8 g/dL — ABNORMAL LOW (ref 12.0–15.0)
MCV: 89.7 fL (ref 78.0–100.0)
RDW: 15.3 % (ref 11.5–15.5)
WBC: 7.6 10*3/uL (ref 4.0–10.5)

## 2012-04-13 LAB — RENAL FUNCTION PANEL
BUN: 13 mg/dL (ref 6–23)
CO2: 26 mEq/L (ref 19–32)
GFR calc Af Amer: >90 mL/min (ref >90)
Glucose, Bld: 173 mg/dL — ABNORMAL HIGH (ref 70–99)
Phosphorus: 2.7 mg/dL (ref 2.3–4.6)
Potassium: 3.7 mEq/L (ref 3.5–5.1)
Sodium: 137 mEq/L (ref 135–145)

## 2012-04-13 LAB — GLUCOSE, CAPILLARY
Glucose-Capillary: 182 mg/dL — ABNORMAL HIGH (ref 70–99)
Glucose-Capillary: 201 mg/dL — ABNORMAL HIGH (ref 70–99)
Glucose-Capillary: 133 mg/dL — ABNORMAL HIGH (ref 70–99)

## 2012-04-13 LAB — VANCOMYCIN, TROUGH: Vancomycin Tr: 18.1 ug/mL (ref 10.0–20.0)

## 2012-04-13 MED ORDER — INSULIN ASPART 100 UNIT/ML ~~LOC~~ SOLN
3.0000 [IU] | SUBCUTANEOUS | Status: DC
  Administered 2012-04-13 – 2012-04-16 (×16): 3 [IU] via SUBCUTANEOUS

## 2012-04-13 NOTE — Progress Notes (Addendum)
ANTIBIOTIC CONSULT NOTE - FOLLOW UP  Pharmacy Consult for Vanc, renal adjustment of antibiotics Indication: HCAP, +blood cultures  Allergies  Allergen Reactions  . Codeine Nausea And Vomiting  . Morphine And Related Nausea And Vomiting    Patient Measurements: Height: 5\' 3"  (160 cm) Weight: 172 lb 9.9 oz (78.3 kg) IBW/kg (Calculated) : 52.4  Vital Signs: Temp: 100.2 F (37.9 C) (03/07 0630) Temp src: Oral (03/07 0630) BP: 165/66 mmHg (03/07 1006) Pulse Rate: 106 (03/07 0630) Intake/Output from previous day: 03/06 0701 - 03/07 0700 In: 2132.9 [I.V.:1472.9; IV Piggyback:100] Out: -  Intake/Output from this shift:    Labs:  Recent Labs  04/11/12 0440 04/12/12 0430 04/13/12 0520  WBC 6.0 6.3 7.6  HGB 11.1* 11.3* 10.8*  PLT 219 256 303  CREATININE 0.45* 0.43* 0.43*   Estimated Creatinine Clearance: 65.8 ml/min (by C-G formula based on Cr of 0.43). No results found for this basename: VANCOTROUGH, VANCOPEAK, VANCORANDOM, GENTTROUGH, GENTPEAK, GENTRANDOM, TOBRATROUGH, TOBRAPEAK, TOBRARND, AMIKACINPEAK, AMIKACINTROU, AMIKACIN,  in the last 72 hours   Assessment: 72 yo F with PMHx pertinent for renal CA, parkinsons, T2DM, and dysphagia with recurrent aspiration PNA on TFs presents 3/2 with AMS, fever.  CXR concerning for PNA - abx started for HCAP.    Today is D#6 Vanc, Cefepime, completed 3 days of Levaquin per HCAP protocol.  Tmax 100.2, WBC wnl, Scr low/stable for CG CrCl of 66, normalized CrCl of 75 ml/min.    3/2 blood cultures with coag neg staph (?contaminant). Repeat blood cultures no growth to date. Continue abx at this time.    Goal of Therapy:  Vancomycin trough level 15-20 mcg/ml  Plan:   Check a vancomycin trough prior to dose at 1500 this afternoon  Pharmacy will f/u  Geoffry Paradise, PharmD, BCPS Pager: [phone] 1:31 PM Pharmacy #: [id number]

## 2012-04-13 NOTE — Progress Notes (Signed)
PHARMACIST - PHYSICIAN COMMUNICATION  CONCERNING:  Vancomycin  71 yoF on Vancomycin and Cefepime (completed 3 days Levaquin) for HCAP and coag-neg staph bacteremia (?contaminant).   Please see full note written earlier today by the clinical pharmacist for more details.  Vancomycin trough obtained after steady state reached.    3/7: Vancomycin trough: 18.1 = therapeutic (goal 15-20)  RECOMMENDATION: Continue Vancomycin 750mg  IV q 12 hours.    Haynes Hoehn, PharmD 04/13/2012 4:16 PM  Pager: [phone]

## 2012-04-13 NOTE — Progress Notes (Signed)
[facility] PROGRESS NOTE  JARISSA SHERIFF ZOX:[id number] DOB: Jul 03, 1940 DOA: 04/08/2012 PCP: No primary Marino Rogerson on file.  Assessment/Plan:  1) Metabolic Encephalopathy in patient with MR - Most likely multifactorial given Pneumonia and per initial reports metabolic acidosis and possible bacteremia - Initial CT of head interpreted as no acute intracranial abnormality - Suspect this has resolved.  2) HAP - flu negative - Urine legionella neg  - step pneumonia Ag neg - Will continue current broad spectrum antibiotics - initial chest x ray interpreted as Developing opacity at the left lung base concerning for pneumonia  3) Positive blood culture - could likely be contaminant.  Will await for results of repeat blood cultures - patient is non toxic appearing - continue current antibiotic regimen fevers have been subsiding on this regimen.  4) Hyperkalemia - replace orally today  - will continue to monitor  5) Anxiety and depression - continue home regimen paxil  6) Elevated BP's - improved with norvasc on board. - hydralazine prn - continue to monitor bp's  7) DM II - continue to monitor blood sugars - Levemir increased to 60 units BID, has had improved blood sugars on this regimen - Per diabetic coordinator recommendations will add novolog 3 units q 4 hours with holding parameters   8) Severe protein calorie malnutrition with albumin 1.9 - continue current tube feeds as listed below. - goal rate 55 ml/hr per dietitian recommendations  Diet: Jevity 1.2 to 58ml/h with free water q4h  Access: PIV  IVF: OFF  Proph: lovenox   Code Status: full  Family Communication: spoke with patient and her husband  Disposition Plan: Continue IV antibiotics. Appreciate PT/OT/Nutrition assistance. Likely D/c to home with RN, SW, and aid.    Consultants:  Dietitian  Procedures:  none  Antibiotics:  Please see above  HPI/Subjective: No new complaints. No  acute issues overnight.  Objective: Filed Vitals:   04/12/12 1803 04/12/12 2206 04/13/12 0630 04/13/12 1006  BP:  152/90 139/63 165/66  Pulse:  117 106   Temp: 99.2 F (37.3 C) 98.6 F (37 C) 100.2 F (37.9 C)   TempSrc: Oral Oral Oral   Resp:  20 18   Height:      Weight:      SpO2:  97% 95%     Intake/Output Summary (Last 24 hours) at 04/13/12 1321 Last data filed at 04/13/12 1300  Gross per 24 hour  Intake 1653.75 ml  Output      0 ml  Net 1653.75 ml   Filed Weights   04/08/12 1300  Weight: 78.3 kg (172 lb 9.9 oz)    Exam:   General:  Pt in NAD, Alert and Awake  Cardiovascular: RRR, no MRG  Respiratory: no wheezes, breath sounds BL, no increased work of breathing  Abdomen: soft, NT, ND  Musculoskeletal: no cyanosis   Data Reviewed: Basic Metabolic Panel:  Recent Labs Lab 04/09/12 0400 04/09/12 0500 04/10/12 0500 04/11/12 0440 04/12/12 0430 04/13/12 0520  NA 135  --  136 136 137 137  K 3.9  --  5.2* 4.1 3.4* 3.7  CL 103  --  104 102 101 100  CO2 23  --  20 22 25 26   GLUCOSE 182*  --  319* 261* 250* 173*  BUN 20  --  16 13 15 13   CREATININE 0.50  --  0.48* 0.45* 0.43* 0.43*  CALCIUM 8.8  --  8.9 9.4 9.8 9.6  MG  --  2.0 2.1 2.0  2.0 1.8  PHOS 2.1*  --  2.8 2.8 3.1 2.7   Liver Function Tests:  Recent Labs Lab 04/08/12 0830  04/09/12 0400 04/10/12 0500 04/11/12 0440 04/12/12 0430 04/13/12 0520  AST 18  --   --   --   --   --   --   ALT 14  --   --   --   --   --   --   ALKPHOS 44  --   --   --   --   --   --   BILITOT 0.2*  --   --   --   --   --   --   PROT 4.8*  --   --   --   --   --   --   ALBUMIN 1.9*  < > 2.8* 2.7* 2.8* 2.9* 2.9*  < > = values in this interval not displayed. No results found for this basename: LIPASE, AMYLASE,  in the last 168 hours  Recent Labs Lab 04/08/12 0830  AMMONIA 18   CBC:  Recent Labs Lab 04/08/12 0830 04/09/12 0500 04/10/12 0445 04/11/12 0440 04/12/12 0430 04/13/12 0520  WBC 5.9 7.7 5.7  6.0 6.3 7.6  NEUTROABS 3.6  --   --   --   --   --   HGB 8.8* 11.0* 11.4* 11.1* 11.3* 10.8*  HCT 27.0* 33.8* 35.1* 34.4* 34.9* 33.8*  MCV 90.9 90.1 89.1 90.1 89.5 89.7  PLT 191 229 214 219 256 303   Cardiac Enzymes: No results found for this basename: CKTOTAL, CKMB, CKMBINDEX, TROPONINI,  in the last 168 hours BNP (last 3 results) No results found for this basename: PROBNP,  in the last 8760 hours CBG:  Recent Labs Lab 04/12/12 2010 04/12/12 2348 04/13/12 0406 04/13/12 0827 04/13/12 1158  GLUCAP 199* 259* 182* 163* 201*    Recent Results (from the past 240 hour(s))  CULTURE, BLOOD (ROUTINE X 2)     Status: None   Collection Time    04/08/12  8:30 AM      Result Value Range Status   Specimen Description BLOOD RIGHT ANTECUBITAL   Final   Special Requests BOTTLES DRAWN AEROBIC AND ANAEROBIC 4 CC EA   Final   Culture  Setup Time 04/08/2012 15:22   Final   Culture     Final   Value:        BLOOD CULTURE RECEIVED NO GROWTH TO DATE CULTURE WILL BE HELD FOR 5 DAYS BEFORE ISSUING A FINAL NEGATIVE REPORT   Report Status PENDING   Incomplete  URINE CULTURE     Status: None   Collection Time    04/08/12  8:52 AM      Result Value Range Status   Specimen Description URINE, CATHETERIZED   Final   Special Requests NONE   Final   Culture  Setup Time 04/08/2012 16:32   Final   Colony Count NO GROWTH   Final   Culture NO GROWTH   Final   Report Status 04/09/2012 FINAL   Final  CULTURE, BLOOD (ROUTINE X 2)     Status: None   Collection Time    04/08/12  9:11 AM      Result Value Range Status   Specimen Description BLOOD RIGHT HAND  1 ML IN [facility] BOTTLE   Final   Special Requests NONE   Final   Culture  Setup Time 04/08/2012 15:22   Final   Culture  Final   Value: STAPHYLOCOCCUS SPECIES (COAGULASE NEGATIVE)     Note: THE SIGNIFICANCE OF ISOLATING THIS ORGANISM FROM A SINGLE SET OF BLOOD CULTURES WHEN MULTIPLE SETS ARE DRAWN IS UNCERTAIN. PLEASE NOTIFY THE MICROBIOLOGY DEPARTMENT  WITHIN ONE WEEK IF SPECIATION AND SENSITIVITIES ARE REQUIRED.     Note: Gram Stain Report Called to,Read Back By and Verified With: KRISTY SYKES ON 04/09/2012 AT 9:33P BY WILEJ   Report Status 04/12/2012 FINAL   Final  MRSA PCR SCREENING     Status: None   Collection Time    04/08/12 11:43 AM      Result Value Range Status   MRSA by PCR NEGATIVE  NEGATIVE Final   Comment:            The GeneXpert MRSA Assay (FDA     approved for NASAL specimens     only), is one component of a     comprehensive MRSA colonization     surveillance program. It is not     intended to diagnose MRSA     infection nor to guide or     monitor treatment for     MRSA infections.  CULTURE, BLOOD (ROUTINE X 2)     Status: None   Collection Time    04/10/12  8:10 AM      Result Value Range Status   Specimen Description BLOOD LEFT ARM   Final   Special Requests BOTTLES DRAWN AEROBIC AND ANAEROBIC 5CC   Final   Culture  Setup Time 04/10/2012 13:46   Final   Culture     Final   Value:        BLOOD CULTURE RECEIVED NO GROWTH TO DATE CULTURE WILL BE HELD FOR 5 DAYS BEFORE ISSUING A FINAL NEGATIVE REPORT   Report Status PENDING   Incomplete  CULTURE, BLOOD (ROUTINE X 2)     Status: None   Collection Time    04/10/12  8:20 AM      Result Value Range Status   Specimen Description BLOOD LEFT ARM   Final   Special Requests BOTTLES DRAWN AEROBIC ONLY 1CC   Final   Culture  Setup Time 04/10/2012 13:46   Final   Culture     Final   Value:        BLOOD CULTURE RECEIVED NO GROWTH TO DATE CULTURE WILL BE HELD FOR 5 DAYS BEFORE ISSUING A FINAL NEGATIVE REPORT   Report Status PENDING   Incomplete     Studies: Dg Chest Port 1 View  04/11/2012  *RADIOLOGY REPORT*  Clinical Data: PICC line placed  PORTABLE CHEST - 1 VIEW  Comparison: Chest radiograph 04/08/2012  Findings: Interval placement of a right PICC line with tip in the distal SVC.  There is no effusion, infiltrate, or pneumothorax.  IMPRESSION: Right PICC line in good  position.   Original Report Authenticated By: Genevive Bi, M.D.     Scheduled Meds: . amLODipine  5 mg Per Tube Daily  . ceFEPime (MAXIPIME) IV  1 g Intravenous Q8H  . docusate  100 mg Per Tube BID  . enoxaparin (LOVENOX) injection  40 mg Subcutaneous Q24H  . fenofibrate  160 mg Oral Daily  . ferrous sulfate  300 mg Per Tube Q breakfast  . folic acid  1 mg Per Tube Daily  . free water  200 mL Per Tube Q4H  . insulin aspart  0-20 Units Subcutaneous Q4H  . insulin aspart  3 Units Subcutaneous Q4H  . insulin  detemir  60 Units Subcutaneous BID  . multivitamin with minerals  1 tablet Per Tube Daily  . PARoxetine  40 mg Per Tube q morning - 10a  . ranitidine  150 mg Per Tube Daily  . thiamine  100 mg Per Tube Daily   Or  . thiamine  100 mg Intravenous Daily  . vancomycin  750 mg Intravenous Q12H   Continuous Infusions: . sodium chloride 1,000 mL (04/12/12 1608)  . feeding supplement (JEVITY 1.2 CAL) 1,000 mL (04/13/12 0431)    Active Problems:   Constipation   Fever   Anxiety and depression   Dyslipidemia   Diabetes mellitus   Sinus tachycardia   HCAP (healthcare-associated pneumonia)   Normocytic anemia   Hypokalemia   Metabolic acidosis, normal anion gap (NAG)   Hypocalcemia   Hypomagnesemia   Protein-calorie malnutrition, severe   Encephalopathy, metabolic   Elevated blood pressure    Time spent: > 35 minutes    Penny Pia  [facility] Pager [phone] If 7PM-7AM, please contact night-coverage at [email or web address], password [facility] 04/13/2012, 1:21 PM  LOS: 5 days

## 2012-04-13 NOTE — Progress Notes (Signed)
Inpatient Diabetes Program Recommendations  AACE/ADA: New Consensus Statement on Inpatient Glycemic Control (2013)  Target Ranges:  Prepandial:   less than 140 mg/dL      Peak postprandial:   less than 180 mg/dL (1-2 hours)      Critically ill patients:  140 - 180 mg/dL    Results for THARA, SEARING (MRN [id number]) as of 04/13/2012 12:54  Ref. Range 04/12/2012 00:16 04/12/2012 04:08 04/12/2012 07:46 04/12/2012 08:13 04/12/2012 12:03 04/12/2012 16:50 04/12/2012 18:02 04/12/2012 20:10  Glucose-Capillary Latest Range: 70-99 mg/dL 409 (H) 811 (H) 914 (H) 208 (H) 216 (H) 215 (H) 220 (H) 199 (H)   Results for CYNIAH, GOSSARD (MRN [id number]) as of 04/13/2012 12:54  Ref. Range 04/12/2012 23:48 04/13/2012 04:06 04/13/2012 08:27 04/13/2012 11:58  Glucose-Capillary Latest Range: 70-99 mg/dL 086 (H) 578 (H) 469 (H) 201 (H)   Noted Levemir increased to 60 units bid on 03/03.  Patient still having glucose elevations.  If patient continues to have glucose levels >200 mg/dl, please consider adding Novolog tube feed coverage before further increasing Levemir:    Novolog 3 units Q4 hours (hold if tube feeds held for any reason).   Note: Will follow. Ambrose Finland RN, MSN, CDE Diabetes Coordinator Inpatient Diabetes Program [phone]

## 2012-04-14 DIAGNOSIS — I10 Essential (primary) hypertension: Secondary | ICD-10-CM

## 2012-04-14 LAB — CULTURE, BLOOD (ROUTINE X 2): Culture: NO GROWTH

## 2012-04-14 LAB — RENAL FUNCTION PANEL
Albumin: 3 g/dL — ABNORMAL LOW (ref 3.5–5.2)
BUN: 11 mg/dL (ref 6–23)
Calcium: 9.7 mg/dL (ref 8.4–10.5)
Creatinine, Ser: 0.38 mg/dL — ABNORMAL LOW (ref 0.50–1.10)
Glucose, Bld: 248 mg/dL — ABNORMAL HIGH (ref 70–99)
Phosphorus: 2.8 mg/dL (ref 2.3–4.6)
Potassium: 3.7 mEq/L (ref 3.5–5.1)

## 2012-04-14 LAB — CBC
HCT: 35.7 % — ABNORMAL LOW (ref 36.0–46.0)
MCH: 28.9 pg (ref 26.0–34.0)
MCV: 89.7 fL (ref 78.0–100.0)
Platelets: 328 10*3/uL (ref 150–400)
RDW: 15.2 % (ref 11.5–15.5)
WBC: 9.2 10*3/uL (ref 4.0–10.5)

## 2012-04-14 LAB — GLUCOSE, CAPILLARY
Glucose-Capillary: 169 mg/dL — ABNORMAL HIGH (ref 70–99)
Glucose-Capillary: 187 mg/dL — ABNORMAL HIGH (ref 70–99)
Glucose-Capillary: 224 mg/dL — ABNORMAL HIGH (ref 70–99)
Glucose-Capillary: 246 mg/dL — ABNORMAL HIGH (ref 70–99)

## 2012-04-14 MED ORDER — METOPROLOL TARTRATE 12.5 MG HALF TABLET
12.5000 mg | ORAL_TABLET | Freq: Two times a day (BID) | ORAL | Status: DC
  Administered 2012-04-14 – 2012-04-15 (×4): 12.5 mg
  Filled 2012-04-14 (×5): qty 1

## 2012-04-14 MED ORDER — LEVOFLOXACIN 750 MG PO TABS
750.0000 mg | ORAL_TABLET | Freq: Every day | ORAL | Status: DC
  Administered 2012-04-14 – 2012-04-16 (×3): 750 mg
  Filled 2012-04-14 (×3): qty 1

## 2012-04-14 NOTE — Progress Notes (Signed)
ANTIBIOTIC CONSULT NOTE - INITIAL  Pharmacy Consult for Levaquin Indication: pneumonia  Labs:  Recent Labs  04/12/12 0430 04/13/12 0520 04/14/12 0445  WBC 6.3 7.6 9.2  HGB 11.3* 10.8* 11.5*  PLT 256 303 328  CREATININE 0.43* 0.43* 0.38*   Estimated Creatinine Clearance: 65.8 ml/min (by C-G formula based on Cr of 0.38).  A/P: Note vanc and cefepime d/c'd today which was D#7 and to restart levaquin due to patient improvement in clinical condition with reassessment in AM. Start Levaquin 750mg  PO daily for CrCl > 50 ml/min   Hessie Knows, PharmD, BCPS Pager [phone] 04/14/2012 11:28 AM

## 2012-04-14 NOTE — Progress Notes (Addendum)
[facility] PROGRESS NOTE  [name] WGN:[id number] DOB: 1940/04/13 DOA: 04/08/2012 PCP: No primary provider on file.  Assessment/Plan:  1) Metabolic Encephalopathy in patient with MR - Most likely multifactorial given Pneumonia and per initial reports metabolic acidosis and possible bacteremia - Initial CT of head interpreted as no acute intracranial abnormality - Suspect this has resolved.  2) HAP - flu negative - Urine legionella neg  - step pneumonia Ag neg - Will continue current broad spectrum antibiotics - initial chest x ray interpreted as Developing opacity at the left lung base concerning for pneumonia - Change antibiotic regimen to levaquin and assess next am.  Will d/c vancomycin and cefepime given patient's improvement in clinical condition.  3) Positive blood culture - could likely be contaminant.  Subsequent blood cultures have been negative.  I suspect that initial 1 positive blood culture was a contaminant. - patient is non toxic appearing - Discussed with ID and they agree that initial positive blood culture was likely contaminant.  4) Hyperkalemia - replace orally today  - will continue to monitor  5) Anxiety and depression - continue home regimen paxil  6) Elevated BP's - improved with norvasc on board. - hydralazine prn - continue to monitor bp's  7) DM II - continue to monitor blood sugars - Levemir increased to 60 units BID, has had improved blood sugars but blood sugars still elevated in the lower 200's. - Per diabetic coordinator recommendations will add novolog 3 units q 4 hours with holding parameters  - reassess next am for further recommendations.  8) Severe protein calorie malnutrition with albumin 1.9 - continue current tube feeds as listed below. - goal rate 55 ml/hr per dietitian recommendations  Diet: Jevity 1.2 to 2ml/h with free water q4h  Access: Picc line  IVF: OFF  Proph: lovenox   Code Status: full   Family Communication: spoke with patient and her husband  Disposition Plan: Continue IV antibiotics. Appreciate PT/OT/Nutrition assistance. Likely D/c to home with RN, SW, and aid.    Consultants:  Dietitian  Procedures:  none  Antibiotics:  Please see above  HPI/Subjective: No new complaints. No acute issues overnight. Pt states she feels better.  Objective: Filed Vitals:   04/13/12 1006 04/13/12 1517 04/13/12 2100 04/14/12 0505  BP: 165/66 153/105 182/76 143/45  Pulse:  114 54 106  Temp:  98 F (36.7 C) 98.2 F (36.8 C) 97.3 F (36.3 C)  TempSrc:  Oral Oral Axillary  Resp:  18 18 18   Height:      Weight:      SpO2:  98% 90% 98%    Intake/Output Summary (Last 24 hours) at 04/14/12 1104 Last data filed at 04/14/12 1047  Gross per 24 hour  Intake 6232.08 ml  Output      6 ml  Net 6226.08 ml   Filed Weights   04/08/12 1300  Weight: 78.3 kg (172 lb 9.9 oz)    Exam:   General:  Pt in NAD, Alert and Awake  Cardiovascular: RRR, no MRG  Respiratory: no wheezes, breath sounds BL, no increased work of breathing  Abdomen: soft, NT, ND  Musculoskeletal: no cyanosis   Data Reviewed: Basic Metabolic Panel:  Recent Labs Lab 04/10/12 0500 04/11/12 0440 04/12/12 0430 04/13/12 0520 04/14/12 0445  NA 136 136 137 137 135  K 5.2* 4.1 3.4* 3.7 3.7  CL 104 102 101 100 98  CO2 20 22 25 26 24   GLUCOSE 319* 261* 250* 173* 248*  BUN 16 13 15 13 11   CREATININE 0.48* 0.45* 0.43* 0.43* 0.38*  CALCIUM 8.9 9.4 9.8 9.6 9.7  MG 2.1 2.0 2.0 1.8 1.6  PHOS 2.8 2.8 3.1 2.7 2.8   Liver Function Tests:  Recent Labs Lab 04/08/12 0830  04/10/12 0500 04/11/12 0440 04/12/12 0430 04/13/12 0520 04/14/12 0445  AST 18  --   --   --   --   --   --   ALT 14  --   --   --   --   --   --   ALKPHOS 44  --   --   --   --   --   --   BILITOT 0.2*  --   --   --   --   --   --   PROT 4.8*  --   --   --   --   --   --   ALBUMIN 1.9*  < > 2.7* 2.8* 2.9* 2.9* 3.0*  < > =  values in this interval not displayed. No results found for this basename: LIPASE, AMYLASE,  in the last 168 hours  Recent Labs Lab 04/08/12 0830  AMMONIA 18   CBC:  Recent Labs Lab 04/08/12 0830  04/10/12 0445 04/11/12 0440 04/12/12 0430 04/13/12 0520 04/14/12 0445  WBC 5.9  < > 5.7 6.0 6.3 7.6 9.2  NEUTROABS 3.6  --   --   --   --   --   --   HGB 8.8*  < > 11.4* 11.1* 11.3* 10.8* 11.5*  HCT 27.0*  < > 35.1* 34.4* 34.9* 33.8* 35.7*  MCV 90.9  < > 89.1 90.1 89.5 89.7 89.7  PLT 191  < > 214 219 256 303 328  < > = values in this interval not displayed. Cardiac Enzymes: No results found for this basename: CKTOTAL, CKMB, CKMBINDEX, TROPONINI,  in the last 168 hours BNP (last 3 results) No results found for this basename: PROBNP,  in the last 8760 hours CBG:  Recent Labs Lab 04/13/12 1607 04/13/12 1953 04/14/12 0052 04/14/12 0342 04/14/12 0739  GLUCAP 133* 196* 187* 224* 261*    Recent Results (from the past 240 hour(s))  CULTURE, BLOOD (ROUTINE X 2)     Status: None   Collection Time    04/08/12  8:30 AM      Result Value Range Status   Specimen Description BLOOD RIGHT ANTECUBITAL   Final   Special Requests BOTTLES DRAWN AEROBIC AND ANAEROBIC 4 CC EA   Final   Culture  Setup Time 04/08/2012 15:22   Final   Culture     Final   Value:        BLOOD CULTURE RECEIVED NO GROWTH TO DATE CULTURE WILL BE HELD FOR 5 DAYS BEFORE ISSUING A FINAL NEGATIVE REPORT   Report Status PENDING   Incomplete  URINE CULTURE     Status: None   Collection Time    04/08/12  8:52 AM      Result Value Range Status   Specimen Description URINE, CATHETERIZED   Final   Special Requests NONE   Final   Culture  Setup Time 04/08/2012 16:32   Final   Colony Count NO GROWTH   Final   Culture NO GROWTH   Final   Report Status 04/09/2012 FINAL   Final  CULTURE, BLOOD (ROUTINE X 2)     Status: None   Collection Time    04/08/12  9:11 AM  Result Value Range Status   Specimen Description  BLOOD RIGHT HAND  1 ML IN [facility] BOTTLE   Final   Special Requests NONE   Final   Culture  Setup Time 04/08/2012 15:22   Final   Culture     Final   Value: STAPHYLOCOCCUS SPECIES (COAGULASE NEGATIVE)     Note: THE SIGNIFICANCE OF ISOLATING THIS ORGANISM FROM A SINGLE SET OF BLOOD CULTURES WHEN MULTIPLE SETS ARE DRAWN IS UNCERTAIN. PLEASE NOTIFY THE MICROBIOLOGY DEPARTMENT WITHIN ONE WEEK IF SPECIATION AND SENSITIVITIES ARE REQUIRED.     Note: Gram Stain Report Called to,Read Back By and Verified With: KRISTY SYKES ON 04/09/2012 AT 9:33P BY WILEJ   Report Status 04/12/2012 FINAL   Final  MRSA PCR SCREENING     Status: None   Collection Time    04/08/12 11:43 AM      Result Value Range Status   MRSA by PCR NEGATIVE  NEGATIVE Final   Comment:            The GeneXpert MRSA Assay (FDA     approved for NASAL specimens     only), is one component of a     comprehensive MRSA colonization     surveillance program. It is not     intended to diagnose MRSA     infection nor to guide or     monitor treatment for     MRSA infections.  CULTURE, BLOOD (ROUTINE X 2)     Status: None   Collection Time    04/10/12  8:10 AM      Result Value Range Status   Specimen Description BLOOD LEFT ARM   Final   Special Requests BOTTLES DRAWN AEROBIC AND ANAEROBIC 5CC   Final   Culture  Setup Time 04/10/2012 13:46   Final   Culture     Final   Value:        BLOOD CULTURE RECEIVED NO GROWTH TO DATE CULTURE WILL BE HELD FOR 5 DAYS BEFORE ISSUING A FINAL NEGATIVE REPORT   Report Status PENDING   Incomplete  CULTURE, BLOOD (ROUTINE X 2)     Status: None   Collection Time    04/10/12  8:20 AM      Result Value Range Status   Specimen Description BLOOD LEFT ARM   Final   Special Requests BOTTLES DRAWN AEROBIC ONLY 1CC   Final   Culture  Setup Time 04/10/2012 13:46   Final   Culture     Final   Value:        BLOOD CULTURE RECEIVED NO GROWTH TO DATE CULTURE WILL BE HELD FOR 5 DAYS BEFORE ISSUING A FINAL NEGATIVE  REPORT   Report Status PENDING   Incomplete     Studies: No results found.  Scheduled Meds: . amLODipine  5 mg Per Tube Daily  . docusate  100 mg Per Tube BID  . enoxaparin (LOVENOX) injection  40 mg Subcutaneous Q24H  . fenofibrate  160 mg Oral Daily  . ferrous sulfate  300 mg Per Tube Q breakfast  . folic acid  1 mg Per Tube Daily  . free water  200 mL Per Tube Q4H  . insulin aspart  0-20 Units Subcutaneous Q4H  . insulin aspart  3 Units Subcutaneous Q4H  . insulin detemir  60 Units Subcutaneous BID  . metoprolol tartrate  12.5 mg Per Tube BID  . multivitamin with minerals  1 tablet Per Tube Daily  . PARoxetine  40 mg Per Tube q morning - 10a  . ranitidine  150 mg Per Tube Daily  . thiamine  100 mg Per Tube Daily   Or  . thiamine  100 mg Intravenous Daily   Continuous Infusions: . sodium chloride 1,000 mL (04/14/12 0503)  . feeding supplement (JEVITY 1.2 CAL) 1,000 mL (04/14/12 0256)    Active Problems:   Constipation   Fever   Anxiety and depression   Dyslipidemia   Diabetes mellitus   Sinus tachycardia   HCAP (healthcare-associated pneumonia)   Normocytic anemia   Hypokalemia   Metabolic acidosis, normal anion gap (NAG)   Hypocalcemia   Hypomagnesemia   Protein-calorie malnutrition, severe   Encephalopathy, metabolic   Elevated blood pressure    Time spent: > 35 minutes    Penny Pia  [facility] Pager [phone] If 7PM-7AM, please contact night-coverage at [email or web address], password [facility] 04/14/2012, 11:04 AM  LOS: 6 days   Addendum HTN: - Not well controlled - Will add metoprolol and reassess next am.

## 2012-04-15 LAB — GLUCOSE, CAPILLARY
Glucose-Capillary: 154 mg/dL — ABNORMAL HIGH (ref 70–99)
Glucose-Capillary: 181 mg/dL — ABNORMAL HIGH (ref 70–99)
Glucose-Capillary: 185 mg/dL — ABNORMAL HIGH (ref 70–99)
Glucose-Capillary: 185 mg/dL — ABNORMAL HIGH (ref 70–99)

## 2012-04-15 LAB — CBC
HCT: 33.9 % — ABNORMAL LOW (ref 36.0–46.0)
Hemoglobin: 10.8 g/dL — ABNORMAL LOW (ref 12.0–15.0)
MCH: 28.6 pg (ref 26.0–34.0)
MCHC: 31.9 g/dL (ref 30.0–36.0)

## 2012-04-15 LAB — RENAL FUNCTION PANEL
CO2: 27 mEq/L (ref 19–32)
Calcium: 9.5 mg/dL (ref 8.4–10.5)
Creatinine, Ser: 0.38 mg/dL — ABNORMAL LOW (ref 0.50–1.10)
Glucose, Bld: 190 mg/dL — ABNORMAL HIGH (ref 70–99)

## 2012-04-15 MED ORDER — JEVITY 1.2 CAL PO LIQD
237.0000 mL | Freq: Every day | ORAL | Status: DC
  Administered 2012-04-15 – 2012-04-16 (×2): 237 mL
  Filled 2012-04-15 (×7): qty 237

## 2012-04-15 MED ORDER — METOPROLOL TARTRATE 25 MG PO TABS
25.0000 mg | ORAL_TABLET | Freq: Two times a day (BID) | ORAL | Status: DC
  Administered 2012-04-16 – 2012-04-18 (×5): 25 mg
  Filled 2012-04-15 (×9): qty 1

## 2012-04-15 MED ORDER — HYDROCORTISONE 1 % EX CREA
TOPICAL_CREAM | Freq: Three times a day (TID) | CUTANEOUS | Status: DC
  Administered 2012-04-15 – 2012-04-17 (×6): via TOPICAL
  Administered 2012-04-17: 1 via TOPICAL
  Administered 2012-04-17 – 2012-04-21 (×11): via TOPICAL
  Filled 2012-04-15 (×3): qty 28

## 2012-04-15 MED ORDER — FREE WATER
240.0000 mL | Freq: Every day | Status: DC
  Administered 2012-04-15 – 2012-04-16 (×3): 240 mL

## 2012-04-15 NOTE — Progress Notes (Signed)
Patient had an episode of projectile vomiting while cleaning her up of an incontinence episode.  Moderate amount of vomit the color of her Jevity.  Will continue to monitor patient.

## 2012-04-15 NOTE — Progress Notes (Signed)
While bathing patient, I noticed a rash all over pt back/bottom. Barrier cream used earlier on day shift. Pt says she has sensitive skin. Cleansed skin and placed antifungal power on peri area.

## 2012-04-15 NOTE — Evaluation (Signed)
Physical Therapy Evaluation Patient Details Name: [name] MRN: [id number] DOB: 1941/01/08 Today's Date: 04/15/2012 Time: 0865-7846 PT Time Calculation (min): 21 min  PT Assessment / Plan / Recommendation Clinical Impression  Pt. is 72 yo female admitted 3/02 from home with AMS,fever. Pt.has h/o CP,Parkinson's, PEG, recurret aspiration pneumonia. Pt. presents with significant motor/tone/joint deformities. 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 Pt also is total care at present. No family available to provide info re: pt's level of function. PT will see on a trial basis until level of function PTA is determined. Per RN, family is pursuing SNF.    PT Assessment  Patient needs continued PT services    Follow Up Recommendations  SNF    Does the patient have the potential to tolerate intense rehabilitation      Barriers to Discharge        Equipment Recommendations  None recommended by PT    Recommendations for Other Services     Frequency Min 2X/week    Precautions / Restrictions Precautions Precautions: Fall Precaution Comments: L knee fused.   Pertinent Vitals/Pain      Mobility  Bed Mobility Bed Mobility: Rolling Left;Rolling Right Rolling Right: 1: +2 Total assist Rolling Right: Patient Percentage: 0% Rolling Left: 1: +2 Total assist Rolling Left: Patient Percentage: 0% Details for Bed Mobility Assistance: pt is unable to assist with rolling. Transfers Transfers: Not assessed Details for Transfer Assistance: Pt incontinent of urine,  did not assist with sitting today. Family not present to provide info re: pt's level of care.    Exercises     PT Diagnosis: Generalized weakness;Altered mental status (joint deformities.)  PT Problem List: Decreased strength;Decreased range of motion;Decreased activity tolerance;Decreased mobility;Decreased coordination;Decreased cognition PT Treatment Interventions: Functional mobility  training;Therapeutic activities;Balance training   PT Goals Acute Rehab PT Goals PT Goal Formulation: Patient unable to participate in goal setting Time For Goal Achievement: 04/29/12 Potential to Achieve Goals: Fair Pt will go Supine/Side to Sit: with +2 total assist PT Goal: Supine/Side to Sit - Progress: Goal set today Pt will Sit at Edge of Bed: 3-5 min;with +2 total assist PT Goal: Sit at Edge Of Bed - Progress: Goal set today Pt will go Sit to Supine/Side: with +2 total assist PT Goal: Sit to Supine/Side - Progress: Goal set today  Visit Information  Last PT Received On: 04/15/12 Assistance Needed: +2    Subjective Data  Subjective: difficult to understand speech, at times answers with 1-2 words.   Prior Functioning  Home Living Lives With: Spouse Additional Comments: plans snf, family not present. RN stated spouse has stated he cannot provide care at this time. Prior Function Level of Independence: Needs assistance Comments: appears to be total care Communication Communication: Expressive difficulties    Cognition  Cognition Overall Cognitive Status: No family/caregiver present to determine baseline cognitive functioning Arousal/Alertness: Awake/alert Cognition - Other Comments: pt is not able to answer questions.    Extremity/Trunk Assessment Right Upper Extremity Assessment RUE ROM/Strength/Tone: Deficits RUE ROM/Strength/Tone Deficits: increased tone with turns, noted  trmors of arm when turned. Left Upper Extremity Assessment LUE ROM/Strength/Tone: Deficits LUE ROM/Strength/Tone Deficits: noted increased tone, BE amputation. Right Lower Extremity Assessment RLE ROM/Strength/Tone: Deficits RLE ROM/Strength/Tone Deficits: ankle plantarflexed, knee appears flaccid. no volitional movement noted Left Lower Extremity Assessment LLE ROM/Strength/Tone: Deficits LLE ROM/Strength/Tone Deficits: knee appears fused, ankle  plantar flexed. no volitional movement noted.    Balance    End of Session PT -  End of Session Activity Tolerance: Patient tolerated treatment well Patient left: in bed;with call bell/phone within reach;with bed alarm set Nurse Communication: Mobility status;Need for lift equipment  GP     Roshawnda, [place] 04/15/2012, 3:34 PM  [phone]

## 2012-04-15 NOTE — Progress Notes (Addendum)
NUTRITION FOLLOW UP/New TF Consult  Intervention:   - Change TF to bolus feedings. Initiate Jevity 1.2 via PEG, 1/2 can 5 times daily. Increase to goal of 1 can 5 times daily to provide 1422 kcal, 65 g protein, 956 ml free water.  - 120 ml free water flush before and after each feeding.    Nutrition Dx:   Inadequate oral intake related to dysphagia as evidenced by NPO status and PEG tube in place, ongoing  Goal:   Patient will meet >/=90% of estimated nutrition needs, meeting  Monitor:   Weight, TF/bolus tolerance, labs  Assessment:   New consult received to change TF regiment to bolus due to power outage at home/prior bolus home regimen. Home regimen was Jevity 1.2 237 ml 4 times daily, which provided 1138 kcal (69% estimated kcal needs), 53 g protein (68% estimated needs), and 768 ml free water.   Current TF: Jevity 1.2 at 55 ml/hr, providing 1584 kcal, 75 g protein, and 1070 ml free water. 200 ml free water flush every 4 hours.   Height: Ht Readings from Last 1 Encounters:  04/08/12 5\' 3"  (1.6 m)    Weight Status:   Wt Readings from Last 1 Encounters:  04/08/12 172 lb 9.9 oz (78.3 kg)    Re-estimated needs:  Kcal: 1550-1650 kcal Protein: 75-90 g  Fluid: >2.3 L  Skin: Intact, PEG in place  Diet Order: NPO   Intake/Output Summary (Last 24 hours) at 04/15/12 1420 Last data filed at 04/15/12 0300  Gross per 24 hour  Intake   4525 ml  Output      0 ml  Net   4525 ml    Last BM: 3/8   Labs:   Recent Labs Lab 04/13/12 0520 04/14/12 0445 04/15/12 0415  NA 137 135 135  K 3.7 3.7 3.6  CL 100 98 99  CO2 26 24 27   BUN 13 11 11   CREATININE 0.43* 0.38* 0.38*  0.38*  CALCIUM 9.6 9.7 9.5  MG 1.8 1.6 1.7  PHOS 2.7 2.8 2.8  GLUCOSE 173* 248* 190*    CBG (last 3)   Recent Labs  04/15/12 0319 04/15/12 0843 04/15/12 1147  GLUCAP 185* 181* 158*    Scheduled Meds: . amLODipine  5 mg Per Tube Daily  . docusate  100 mg Per Tube BID  . enoxaparin  (LOVENOX) injection  40 mg Subcutaneous Q24H  . fenofibrate  160 mg Oral Daily  . ferrous sulfate  300 mg Per Tube Q breakfast  . folic acid  1 mg Per Tube Daily  . free water  200 mL Per Tube Q4H  . hydrocortisone cream   Topical TID  . insulin aspart  0-20 Units Subcutaneous Q4H  . insulin aspart  3 Units Subcutaneous Q4H  . insulin detemir  60 Units Subcutaneous BID  . levofloxacin  750 mg Per Tube Daily  . metoprolol tartrate  12.5 mg Per Tube BID  . multivitamin with minerals  1 tablet Per Tube Daily  . PARoxetine  40 mg Per Tube q morning - 10a  . ranitidine  150 mg Per Tube Daily  . thiamine  100 mg Per Tube Daily   Or  . thiamine  100 mg Intravenous Daily    Continuous Infusions: . sodium chloride 1,000 mL (04/14/12 0503)  . feeding supplement (JEVITY 1.2 CAL) 1,000 mL (04/15/12 0008)    Linnell Fulling, RD, LDN Pager #: [phone] After-Hours Pager #: [phone]

## 2012-04-15 NOTE — Progress Notes (Signed)
[facility] PROGRESS NOTE  [name] ZOX:[id number] DOB: November 06, 1940 DOA: 04/08/2012 PCP: No primary Guila Owensby on file.  Assessment/Plan:  1) Metabolic Encephalopathy in patient with MR - Most likely multifactorial given Pneumonia and per initial reports metabolic acidosis and possible bacteremia - Initial CT of head interpreted as no acute intracranial abnormality - pt has improved clinicaly and appears to be at baseline at this time- as per faily at bedside  2) HAP - flu negative - Urine legionella neg  - step pneumonia Ag neg - Will continue current broad spectrum antibiotics - initial chest x ray interpreted as Developing opacity at the left lung base concerning for pneumonia - Change antibiotic regimen to levaquin and assess next am.  Will d/c vancomycin and cefepime given patient's improvement in clinical condition.  3) Positive blood culture - could likely be contaminant.  Subsequent blood cultures have been negative.  I suspect that initial 1 positive blood culture was a contaminant. - patient is non toxic appearing - Discussed with ID and they agree that initial positive blood culture was likely contaminant.  4) Hyperkalemia - replace orally today  - will continue to monitor  5) Anxiety and depression - continue home regimen paxil  6) HTN, UNCONTROLLED -On norvasc, increase metoprolol added 3/8  - continue hydralazine prn - continue to monitor bp's  7) DM II -better BG control on current regimen, will continue and monitor  8) Severe protein calorie malnutrition with albumin 1.9 - continue current tube feeds as listed below.   Pt currently on  Jevity 1.2 to 42ml/h with free water q4h, barrier to going home is that they have no power at home and continuous TF will be problem then- Nutrition reconsulted to  Change to bolus feeding and plan DC in am. Access: Picc line  IVF: OFF  Proph: lovenox   Code Status: full  Family Communication: spoke  with patient and her husband  Disposition Plan:   Appreciate PT/OT/Nutrition assistance. Likely D/c to home with RN, SW, and aid in am.    Consultants:  Dietitian  Procedures:  none  Antibiotics:  Please see above  HPI/Subjective: Chart reviewed, she denies any c/o, states she feels better. Husband at bedside.  Objective: Filed Vitals:   04/14/12 1448 04/14/12 2100 04/15/12 0453 04/15/12 0724  BP: 134/92 153/101 158/81   Pulse: 96 108 105   Temp: 98.7 F (37.1 C) 98.8 F (37.1 C) 98.7 F (37.1 C)   TempSrc: Oral Oral Oral   Resp: 18 18 28 20   Height:      Weight:      SpO2: 97% 100% 100% 100%    Intake/Output Summary (Last 24 hours) at 04/15/12 1236 Last data filed at 04/15/12 0300  Gross per 24 hour  Intake   4525 ml  Output      0 ml  Net   4525 ml   Filed Weights   04/08/12 1300  Weight: 78.3 kg (172 lb 9.9 oz)    Exam:   General:  Pt in NAD, Alert and Awake  Cardiovascular: RRR, no MRG  Respiratory: decreased breath sounds at bases, no wheezes no increased work of breathing  Abdomen: soft, NT, ND  Musculoskeletal: no cyanosis   Data Reviewed: Basic Metabolic Panel:  Recent Labs Lab 04/11/12 0440 04/12/12 0430 04/13/12 0520 04/14/12 0445 04/15/12 0415  NA 136 137 137 135 135  K 4.1 3.4* 3.7 3.7 3.6  CL 102 101 100 98 99  CO2 22 25 26  24 27  GLUCOSE 261* 250* 173* 248* 190*  BUN 13 15 13 11 11   CREATININE 0.45* 0.43* 0.43* 0.38* 0.38*  0.38*  CALCIUM 9.4 9.8 9.6 9.7 9.5  MG 2.0 2.0 1.8 1.6 1.7  PHOS 2.8 3.1 2.7 2.8 2.8   Liver Function Tests:  Recent Labs Lab 04/11/12 0440 04/12/12 0430 04/13/12 0520 04/14/12 0445 04/15/12 0415  ALBUMIN 2.8* 2.9* 2.9* 3.0* 3.1*   No results found for this basename: LIPASE, AMYLASE,  in the last 168 hours No results found for this basename: AMMONIA,  in the last 168 hours CBC:  Recent Labs Lab 04/11/12 0440 04/12/12 0430 04/13/12 0520 04/14/12 0445 04/15/12 0415  WBC 6.0 6.3 7.6  9.2 8.2  HGB 11.1* 11.3* 10.8* 11.5* 10.8*  HCT 34.4* 34.9* 33.8* 35.7* 33.9*  MCV 90.1 89.5 89.7 89.7 89.7  PLT 219 256 303 328 351   Cardiac Enzymes: No results found for this basename: CKTOTAL, CKMB, CKMBINDEX, TROPONINI,  in the last 168 hours BNP (last 3 results) No results found for this basename: PROBNP,  in the last 8760 hours CBG:  Recent Labs Lab 04/14/12 1615 04/14/12 1956 04/15/12 0012 04/15/12 0319 04/15/12 0843  GLUCAP 169* 169* 206* 185* 181*    Recent Results (from the past 240 hour(s))  CULTURE, BLOOD (ROUTINE X 2)     Status: None   Collection Time    04/08/12  8:30 AM      Result Value Range Status   Specimen Description BLOOD RIGHT ANTECUBITAL   Final   Special Requests BOTTLES DRAWN AEROBIC AND ANAEROBIC 4 CC EA   Final   Culture  Setup Time 04/08/2012 15:22   Final   Culture NO GROWTH 5 DAYS   Final   Report Status 04/14/2012 FINAL   Final  URINE CULTURE     Status: None   Collection Time    04/08/12  8:52 AM      Result Value Range Status   Specimen Description URINE, CATHETERIZED   Final   Special Requests NONE   Final   Culture  Setup Time 04/08/2012 16:32   Final   Colony Count NO GROWTH   Final   Culture NO GROWTH   Final   Report Status 04/09/2012 FINAL   Final  CULTURE, BLOOD (ROUTINE X 2)     Status: None   Collection Time    04/08/12  9:11 AM      Result Value Range Status   Specimen Description BLOOD RIGHT HAND  1 ML IN [facility] BOTTLE   Final   Special Requests NONE   Final   Culture  Setup Time 04/08/2012 15:22   Final   Culture     Final   Value: STAPHYLOCOCCUS SPECIES (COAGULASE NEGATIVE)     Note: THE SIGNIFICANCE OF ISOLATING THIS ORGANISM FROM A SINGLE SET OF BLOOD CULTURES WHEN MULTIPLE SETS ARE DRAWN IS UNCERTAIN. PLEASE NOTIFY THE MICROBIOLOGY DEPARTMENT WITHIN ONE WEEK IF SPECIATION AND SENSITIVITIES ARE REQUIRED.     Note: Gram Stain Report Called to,Read Back By and Verified With: KRISTY SYKES ON 04/09/2012 AT 9:33P BY WILEJ    Report Status 04/12/2012 FINAL   Final  MRSA PCR SCREENING     Status: None   Collection Time    04/08/12 11:43 AM      Result Value Range Status   MRSA by PCR NEGATIVE  NEGATIVE Final   Comment:            The GeneXpert MRSA  Assay (FDA     approved for NASAL specimens     only), is one component of a     comprehensive MRSA colonization     surveillance program. It is not     intended to diagnose MRSA     infection nor to guide or     monitor treatment for     MRSA infections.  CULTURE, BLOOD (ROUTINE X 2)     Status: None   Collection Time    04/10/12  8:10 AM      Result Value Range Status   Specimen Description BLOOD LEFT ARM   Final   Special Requests BOTTLES DRAWN AEROBIC AND ANAEROBIC 5CC   Final   Culture  Setup Time 04/10/2012 13:46   Final   Culture     Final   Value:        BLOOD CULTURE RECEIVED NO GROWTH TO DATE CULTURE WILL BE HELD FOR 5 DAYS BEFORE ISSUING A FINAL NEGATIVE REPORT   Report Status PENDING   Incomplete  CULTURE, BLOOD (ROUTINE X 2)     Status: None   Collection Time    04/10/12  8:20 AM      Result Value Range Status   Specimen Description BLOOD LEFT ARM   Final   Special Requests BOTTLES DRAWN AEROBIC ONLY 1CC   Final   Culture  Setup Time 04/10/2012 13:46   Final   Culture     Final   Value:        BLOOD CULTURE RECEIVED NO GROWTH TO DATE CULTURE WILL BE HELD FOR 5 DAYS BEFORE ISSUING A FINAL NEGATIVE REPORT   Report Status PENDING   Incomplete     Studies: No results found.  Scheduled Meds: . amLODipine  5 mg Per Tube Daily  . docusate  100 mg Per Tube BID  . enoxaparin (LOVENOX) injection  40 mg Subcutaneous Q24H  . fenofibrate  160 mg Oral Daily  . ferrous sulfate  300 mg Per Tube Q breakfast  . folic acid  1 mg Per Tube Daily  . free water  200 mL Per Tube Q4H  . insulin aspart  0-20 Units Subcutaneous Q4H  . insulin aspart  3 Units Subcutaneous Q4H  . insulin detemir  60 Units Subcutaneous BID  . levofloxacin  750 mg Per Tube  Daily  . metoprolol tartrate  12.5 mg Per Tube BID  . multivitamin with minerals  1 tablet Per Tube Daily  . PARoxetine  40 mg Per Tube q morning - 10a  . ranitidine  150 mg Per Tube Daily  . thiamine  100 mg Per Tube Daily   Or  . thiamine  100 mg Intravenous Daily   Continuous Infusions: . sodium chloride 1,000 mL (04/14/12 0503)  . feeding supplement (JEVITY 1.2 CAL) 1,000 mL (04/15/12 0008)    Active Problems:   Constipation   Fever   Anxiety and depression   Dyslipidemia   Diabetes mellitus   Sinus tachycardia   HCAP (healthcare-associated pneumonia)   Normocytic anemia   Hypokalemia   Metabolic acidosis, normal anion gap (NAG)   Hypocalcemia   Hypomagnesemia   Protein-calorie malnutrition, severe   Encephalopathy, metabolic   Elevated blood pressure   HTN (hypertension)    Time spent: > 35 minutes    VIYUOH,ADELINE C  [facility] Pager [phone] If 7PM-7AM, please contact night-coverage at [email or web address], password [facility] 04/15/2012, 12:36 PM  LOS: 7 days

## 2012-04-16 ENCOUNTER — Inpatient Hospital Stay (HOSPITAL_COMMUNITY): Payer: 59

## 2012-04-16 ENCOUNTER — Encounter (HOSPITAL_COMMUNITY): Payer: Self-pay | Admitting: Radiology

## 2012-04-16 ENCOUNTER — Other Ambulatory Visit (HOSPITAL_COMMUNITY): Payer: 59

## 2012-04-16 LAB — CBC
HCT: 35.6 % — ABNORMAL LOW (ref 36.0–46.0)
Hemoglobin: 11.3 g/dL — ABNORMAL LOW (ref 12.0–15.0)
MCV: 89.4 fL (ref 78.0–100.0)
RDW: 15.1 % (ref 11.5–15.5)
WBC: 8.9 10*3/uL (ref 4.0–10.5)

## 2012-04-16 LAB — BASIC METABOLIC PANEL
BUN: 9 mg/dL (ref 6–23)
Calcium: 9.1 mg/dL (ref 8.4–10.5)
Creatinine, Ser: 0.38 mg/dL — ABNORMAL LOW (ref 0.50–1.10)
GFR calc Af Amer: >90 mL/min (ref >90)
GFR calc non Af Amer: >90 mL/min (ref >90)

## 2012-04-16 LAB — TROPONIN I
Troponin I: <0.3 ng/mL (ref <0.30)
Troponin I: <0.3 ng/mL (ref <0.30)
Troponin I: <0.3 ng/mL (ref <0.30)

## 2012-04-16 LAB — CULTURE, BLOOD (ROUTINE X 2)
Culture: NO GROWTH
Culture: NO GROWTH

## 2012-04-16 LAB — RENAL FUNCTION PANEL
BUN: 9 mg/dL (ref 6–23)
Glucose, Bld: 82 mg/dL (ref 70–99)
Phosphorus: 2.7 mg/dL (ref 2.3–4.6)
Potassium: 3.3 mEq/L — ABNORMAL LOW (ref 3.5–5.1)

## 2012-04-16 LAB — GLUCOSE, CAPILLARY
Glucose-Capillary: 124 mg/dL — ABNORMAL HIGH (ref 70–99)
Glucose-Capillary: 170 mg/dL — ABNORMAL HIGH (ref 70–99)
Glucose-Capillary: 177 mg/dL — ABNORMAL HIGH (ref 70–99)
Glucose-Capillary: 186 mg/dL — ABNORMAL HIGH (ref 70–99)

## 2012-04-16 MED ORDER — SODIUM CHLORIDE 0.9 % IV SOLN
500.0000 mg | Freq: Two times a day (BID) | INTRAVENOUS | Status: DC
  Filled 2012-04-16: qty 5

## 2012-04-16 MED ORDER — ACETAMINOPHEN 650 MG RE SUPP
650.0000 mg | Freq: Once | RECTAL | Status: AC
  Administered 2012-04-16: 650 mg via RECTAL
  Filled 2012-04-16: qty 1

## 2012-04-16 MED ORDER — SODIUM CHLORIDE 0.9 % IV SOLN
1000.0000 mg | Freq: Two times a day (BID) | INTRAVENOUS | Status: DC
  Administered 2012-04-17 – 2012-04-21 (×9): 1000 mg via INTRAVENOUS
  Filled 2012-04-16 (×11): qty 10

## 2012-04-16 MED ORDER — LORAZEPAM 2 MG/ML IJ SOLN
1.0000 mg | Freq: Once | INTRAMUSCULAR | Status: AC
  Administered 2012-04-16: 1 mg via INTRAVENOUS
  Filled 2012-04-16: qty 1

## 2012-04-16 MED ORDER — INSULIN ASPART 100 UNIT/ML ~~LOC~~ SOLN
0.0000 [IU] | SUBCUTANEOUS | Status: DC
  Administered 2012-04-16 – 2012-04-17 (×2): 3 [IU] via SUBCUTANEOUS
  Administered 2012-04-17: 2 [IU] via SUBCUTANEOUS
  Administered 2012-04-17: 3 [IU] via SUBCUTANEOUS
  Administered 2012-04-18: 5 [IU] via SUBCUTANEOUS
  Administered 2012-04-18: 8 [IU] via SUBCUTANEOUS
  Administered 2012-04-18: 3 [IU] via SUBCUTANEOUS
  Administered 2012-04-18: 5 [IU] via SUBCUTANEOUS
  Administered 2012-04-18: 3 [IU] via SUBCUTANEOUS
  Administered 2012-04-18: 2 [IU] via SUBCUTANEOUS
  Administered 2012-04-19 (×5): 5 [IU] via SUBCUTANEOUS
  Administered 2012-04-19: 8 [IU] via SUBCUTANEOUS
  Administered 2012-04-20 (×6): 5 [IU] via SUBCUTANEOUS
  Administered 2012-04-21: 3 [IU] via SUBCUTANEOUS
  Administered 2012-04-21 (×3): 5 [IU] via SUBCUTANEOUS

## 2012-04-16 MED ORDER — PIPERACILLIN-TAZOBACTAM 3.375 G IVPB
3.3750 g | Freq: Three times a day (TID) | INTRAVENOUS | Status: DC
  Administered 2012-04-16 – 2012-04-20 (×11): 3.375 g via INTRAVENOUS
  Filled 2012-04-16 (×12): qty 50

## 2012-04-16 MED ORDER — SODIUM CHLORIDE 0.9 % IV SOLN
1000.0000 mg | INTRAVENOUS | Status: AC
  Administered 2012-04-16: 1000 mg via INTRAVENOUS
  Filled 2012-04-16: qty 20

## 2012-04-16 MED ORDER — POTASSIUM CHLORIDE 10 MEQ/100ML IV SOLN
10.0000 meq | INTRAVENOUS | Status: AC
  Administered 2012-04-16 – 2012-04-17 (×4): 10 meq via INTRAVENOUS
  Filled 2012-04-16: qty 400

## 2012-04-16 MED ORDER — SODIUM CHLORIDE 0.9 % IV SOLN
500.0000 mg | INTRAVENOUS | Status: AC
  Administered 2012-04-16: 1000 mg via INTRAVENOUS
  Filled 2012-04-16: qty 5

## 2012-04-16 MED ORDER — LEVETIRACETAM 500 MG/5ML IV SOLN
1000.0000 mg | Freq: Once | INTRAVENOUS | Status: AC
  Administered 2012-04-16: 1000 mg via INTRAVENOUS
  Filled 2012-04-16: qty 10

## 2012-04-16 MED ORDER — LEVALBUTEROL HCL 0.63 MG/3ML IN NEBU
0.6300 mg | INHALATION_SOLUTION | RESPIRATORY_TRACT | Status: DC | PRN
  Filled 2012-04-16: qty 3

## 2012-04-16 MED ORDER — SODIUM CHLORIDE 0.9 % IV SOLN
750.0000 mg | Freq: Two times a day (BID) | INTRAVENOUS | Status: DC
  Administered 2012-04-16 – 2012-04-19 (×7): 750 mg via INTRAVENOUS
  Filled 2012-04-16 (×8): qty 750

## 2012-04-16 NOTE — Progress Notes (Signed)
ANTIBIOTIC CONSULT NOTE - FOLLOW UP  Pharmacy Consult for: Vancomycin and Zosyn Indication:   Pneumonia  Allergies  Allergen Reactions  . Codeine Nausea And Vomiting  . Morphine And Related Nausea And Vomiting    Patient Measurements: Height: 5\' 3"  (160 cm) Weight: 172 lb 9.9 oz (78.3 kg) IBW/kg (Calculated) : 52.4  Vital Signs: Temp: 103.4 F (39.7 C) (03/10 1745) Temp src: Rectal (03/10 1745) BP: 139/63 mmHg (03/10 1800) Pulse Rate: 118 (03/10 1800) Intake/Output from previous day: 03/09 0701 - 03/10 0700 In: 4737 [I.V.:3000] Out: -   Labs:  Recent Labs  04/14/12 0445 04/15/12 0415 04/16/12 0630 04/16/12 0905  WBC 9.2 8.2 8.9  --   HGB 11.5* 10.8* 11.3*  --   PLT 328 351 409*  --   CREATININE 0.38* 0.38*  0.38* 0.38* 0.38*   Estimated Creatinine Clearance: 65.8 ml/min (by C-G formula based on Cr of 0.38).   Microbiology: Recent Results (from the past 720 hour(s))  CULTURE, BLOOD (ROUTINE X 2)     Status: None   Collection Time    04/08/12  8:30 AM      Result Value Range Status   Specimen Description BLOOD RIGHT ANTECUBITAL   Final   Special Requests BOTTLES DRAWN AEROBIC AND ANAEROBIC 4 CC EA   Final   Culture  Setup Time 04/08/2012 15:22   Final   Culture NO GROWTH 5 DAYS   Final   Report Status 04/14/2012 FINAL   Final  URINE CULTURE     Status: None   Collection Time    04/08/12  8:52 AM      Result Value Range Status   Specimen Description URINE, CATHETERIZED   Final   Special Requests NONE   Final   Culture  Setup Time 04/08/2012 16:32   Final   Colony Count NO GROWTH   Final   Culture NO GROWTH   Final   Report Status 04/09/2012 FINAL   Final  CULTURE, BLOOD (ROUTINE X 2)     Status: None   Collection Time    04/08/12  9:11 AM      Result Value Range Status   Specimen Description BLOOD RIGHT HAND  1 ML IN [facility] BOTTLE   Final   Special Requests NONE   Final   Culture  Setup Time 04/08/2012 15:22   Final   Culture     Final   Value:  STAPHYLOCOCCUS SPECIES (COAGULASE NEGATIVE)     Note: THE SIGNIFICANCE OF ISOLATING THIS ORGANISM FROM A SINGLE SET OF BLOOD CULTURES WHEN MULTIPLE SETS ARE DRAWN IS UNCERTAIN. PLEASE NOTIFY THE MICROBIOLOGY DEPARTMENT WITHIN ONE WEEK IF SPECIATION AND SENSITIVITIES ARE REQUIRED.     Note: Gram Stain Report Called to,Read Back By and Verified With: KRISTY SYKES ON 04/09/2012 AT 9:33P BY WILEJ   Report Status 04/12/2012 FINAL   Final  MRSA PCR SCREENING     Status: None   Collection Time    04/08/12 11:43 AM      Result Value Range Status   MRSA by PCR NEGATIVE  NEGATIVE Final   Comment:            The GeneXpert MRSA Assay (FDA     approved for NASAL specimens     only), is one component of a     comprehensive MRSA colonization     surveillance program. It is not     intended to diagnose MRSA     infection nor to guide or  monitor treatment for     MRSA infections.  CULTURE, BLOOD (ROUTINE X 2)     Status: None   Collection Time    04/10/12  8:10 AM      Result Value Range Status   Specimen Description BLOOD LEFT ARM   Final   Special Requests BOTTLES DRAWN AEROBIC AND ANAEROBIC 5CC   Final   Culture  Setup Time 04/10/2012 13:46   Final   Culture NO GROWTH 5 DAYS   Final   Report Status 04/16/2012 FINAL   Final  CULTURE, BLOOD (ROUTINE X 2)     Status: None   Collection Time    04/10/12  8:20 AM      Result Value Range Status   Specimen Description BLOOD LEFT ARM   Final   Special Requests BOTTLES DRAWN AEROBIC ONLY 1CC   Final   Culture  Setup Time 04/10/2012 13:46   Final   Culture NO GROWTH 5 DAYS   Final   Report Status 04/16/2012 FINAL   Final    Anti-infectives   Start     Dose/Rate Route Frequency Ordered Stop   04/16/12 2000  piperacillin-tazobactam (ZOSYN) IVPB 3.375 g     3.375 g 12.5 mL/hr over 240 Minutes Intravenous Every 8 hours 04/16/12 1854     04/14/12 1200  levofloxacin (LEVAQUIN) tablet 750 mg  Status:  Discontinued     750 mg Per Tube Daily 04/14/12  1129 04/16/12 1830   04/11/12 1443  ceFEPIme (MAXIPIME) 1 g in dextrose 5 % 50 mL IVPB  Status:  Discontinued     1 g 100 mL/hr over 30 Minutes Intravenous 3 times per day 04/11/12 1443 04/14/12 0929   04/10/12 1600  vancomycin (VANCOCIN) 750 mg in sodium chloride 0.9 % 150 mL IVPB  Status:  Discontinued     750 mg 150 mL/hr over 60 Minutes Intravenous Every 12 hours 04/10/12 1525 04/14/12 0929   04/08/12 2200  vancomycin (VANCOCIN) IVPB 1000 mg/200 mL premix  Status:  Discontinued     1,000 mg 200 mL/hr over 60 Minutes Intravenous Every 12 hours 04/08/12 1217 04/10/12 0953   04/08/12 1400  ceFEPIme (MAXIPIME) 1 g in dextrose 5 % 50 mL IVPB  Status:  Discontinued     1 g 100 mL/hr over 30 Minutes Intravenous 3 times per day 04/08/12 1202 04/11/12 1443   04/08/12 1300  levofloxacin (LEVAQUIN) IVPB 750 mg     750 mg 100 mL/hr over 90 Minutes Intravenous Every 24 hours 04/08/12 1202 04/10/12 1413   04/08/12 0900  vancomycin (VANCOCIN) IVPB 1000 mg/200 mL premix     1,000 mg 200 mL/hr over 60 Minutes Intravenous To Emergency Dept 04/08/12 0808 04/08/12 1038   04/08/12 0900  piperacillin-tazobactam (ZOSYN) IVPB 3.375 g     3.375 g 100 mL/hr over 30 Minutes Intravenous To Emergency Dept 04/08/12 0808 04/08/12 1012      Assessment:  Asked to assist with changes to antibiotic regimen.  Levaquin has been discontinued, Vancomycin has been resumed, and Zosyn added.  We will restart Vancomycin at the previous dose, which produced a level within the therapeutic range on 04/13/12.  Goals of Therapy:   Vancomycin trough levels 15-20 mcg/ml  Eradication of infection  Plan:   Resume Vancomycin 750 mg IV every 12 hours.  Begin Zosyn 3.375 grams IV every 8 hours, each dose infused over 4 hours.  Polo Riley R.Ph. 04/16/2012 7:12 PM

## 2012-04-16 NOTE — Progress Notes (Signed)
Nutrition Brief Note  Per RN pt received one bolus feed this morning and projectile vomited the tube feeding an hour later. Tube feeds have been held the rest of today.  Pt has also had a change in consciousness and is being monitored. May need to consider returning to continuous feeds. Will monitor pt closely.  Ian Malkin RD, LDN Inpatient Clinical Dietitian Pager: [phone] After Hours Pager: [phone]

## 2012-04-16 NOTE — Consult Note (Signed)
NEURO HOSPITALIST CONSULT NOTE    Reason for Consult:AMS/SZ  HPI:                                                                                                                                          [name] is an 72 y.o. female with history of right parietal surgery, T2 DM, possible MR and history of CP.  Per nursing staff at baseline she will follow commands, usually non verbal, able to tract movements in room.  This AM it was noted patient was not responding as usual and eyes were deviated to the left.  A 1000 mg bolus of Keppra (at 12:26) was administered and 1 mg Ativan was given prior at 10:21.  Upon evaluation patient is snoring, no response to verbal or painful stimulation, eyes deviated to the left and do not cross midline with oculocephalic movement.   Past Medical History  Diagnosis Date  . Diabetes mellitus   . Renal disorder   . CP (cerebral palsy)   . Renal cancer     s/p nephrectomy  . Parkinsonism   . Dysphagia   . Recurrent aspiration pneumonia   . Bladder spasms   . Amputation of arm   . Recurrent UTI     Past Surgical History  Procedure Laterality Date  . 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 Peg tube placement    . Knee surgery    . Nephrectomy      Family History: Unable to obtain  Social History:  reports that she has never smoked. She has never used smokeless tobacco. She reports that she does not drink alcohol or use illicit drugs.  Allergies  Allergen Reactions  . Codeine Nausea And Vomiting  . Morphine And Related Nausea And Vomiting    MEDICATIONS:                                                                                                                     Prior to Admission:  Prescriptions prior to admission  Medication Sig Dispense Refill  . clonazePAM (KLONOPIN) 1 MG tablet Place 1 mg into feeding tube 2 (two) times daily.       . diphenoxylate-atropine (LOMOTIL) 2.5-0.025 MG per  tablet Place 1 tablet into feeding tube 3 (three) times daily as needed. For diarrhea      . fenofibrate (TRICOR) 145 MG tablet Place 145 mg into feeding tube at bedtime.       Marland Kitchen HYDROcodone-acetaminophen (NORCO/VICODIN) 5-325 MG per tablet Place 1 tablet into feeding tube 3 (three) times daily as needed for pain.      Marland Kitchen insulin detemir (LEVEMIR) 100 UNIT/ML injection Inject 55 Units into the skin 2 (two) times daily.       . metFORMIN (GLUCOPHAGE) 1000 MG tablet Take 1,000 mg by mouth 2 (two) times daily with a meal.       . Nutritional Supplements (FEEDING SUPPLEMENT, JEVITY 1.2 CAL,) LIQD Place 237 mLs into feeding tube 4 (four) times daily. Flush with 8oz of water after each tube feed.      Marland Kitchen PARoxetine (PAXIL) 40 MG tablet Place 40 mg into feeding tube every morning.       . pregabalin (LYRICA) 150 MG capsule Place 150 mg into feeding tube 2 (two) times daily.       . ranitidine (ZANTAC) 300 MG tablet Place 300 mg into feeding tube at bedtime.        Scheduled: . amLODipine  5 mg Per Tube Daily  . docusate  100 mg Per Tube BID  . enoxaparin (LOVENOX) injection  40 mg Subcutaneous Q24H  . feeding supplement (JEVITY 1.2 CAL)  237 mL Per Tube 5 X Daily  . fenofibrate  160 mg Oral Daily  . ferrous sulfate  300 mg Per Tube Q breakfast  . folic acid  1 mg Per Tube Daily  . fosPHENYtoin (CEREBYX) IV  1,000 mg PE Intravenous STAT  . free water  240 mL Per Tube 5 X Daily  . hydrocortisone cream   Topical TID  . insulin aspart  0-20 Units Subcutaneous Q4H  . insulin aspart  3 Units Subcutaneous Q4H  . insulin detemir  60 Units Subcutaneous BID  . levETIRAcetam  1,000 mg Intravenous Q12H  . levofloxacin  750 mg Per Tube Daily  . metoprolol tartrate  25 mg Per Tube BID  . multivitamin with minerals  1 tablet Per Tube Daily  . PARoxetine  40 mg Per Tube q morning - 10a  . ranitidine  150 mg Per Tube Daily  . thiamine  100 mg Per Tube Daily   Or  . thiamine  100 mg Intravenous Daily      ROS:                                                                                                                                       History obtained from unobtainable from patient due to mental status   Blood pressure 156/92, pulse 109, temperature 98.8 F (37.1 C), temperature source Axillary, resp. rate 20, height 5\' 3"  (1.6 m), weight 78.3 kg (172 lb 9.9 oz), SpO2 99.00%.  Neurologic Examination:                                                                                                      Mental Status: Patient initially snoring, when sternal rub administered snoring will stop but no response to pain, does not respond to verbal stimulation.  Cranial Nerves: II: Discs flat bilaterally; no blink to threat, pupils equal, round, reactive to light  III,IV, VI: eyelids closed but easily retracted, eyes deviated to the left and will not cross midline to oculocephalic motion.  V,VII: face symmetric,no response to facial pain stimulation VIII:unable to assess IX,X: gag reflex present XI: unable to assess XII: unable to assess Motor: Right leg has normal tone, no spontaneous movement and no response to pain.  Left leg is fused at the knee and no response to pain. Right arm shows normal tone and no spontaneous movement. Left arm shows mild twitching at stump (mid forearm) with normal tone.  Tone and bulk:normal tone throughout; no atrophy noted Sensory: no response to noxious stimuli Deep Tendon Reflexes: 2+ and symmetric throughout UE, no KJ or AJ bilaterally Plantars: Mute bilaterally    No results found for this basename: cbc, bmp, coags, chol, tri, ldl, hga1c    Results for orders placed during the hospital encounter of 04/08/12 (from the past 48 hour(s))  GLUCOSE, CAPILLARY     Status: Abnormal   Collection Time    04/14/12  4:15 PM      Result Value Range   Glucose-Capillary 169 (*) 70 - 99 mg/dL  GLUCOSE, CAPILLARY     Status: Abnormal   Collection Time     04/14/12  7:56 PM      Result Value Range   Glucose-Capillary 169 (*) 70 - 99 mg/dL  GLUCOSE, CAPILLARY     Status: Abnormal   Collection Time    04/15/12 12:12 AM      Result Value Range   Glucose-Capillary 206 (*) 70 - 99 mg/dL   Comment 1 Notify RN     Comment 2 Documented in Chart    GLUCOSE, CAPILLARY     Status: Abnormal   Collection Time    04/15/12  3:19 AM      Result Value Range   Glucose-Capillary 185 (*) 70 - 99 mg/dL   Comment 1 Notify RN     Comment 2 Documented in Chart    CREATININE, SERUM     Status: Abnormal   Collection Time    04/15/12  4:15 AM      Result Value Range   Creatinine, Ser 0.38 (*) 0.50 - 1.10 mg/dL   GFR calc non Af Amer >90  >90 mL/min   GFR calc Af Amer >90  >90 mL/min   Comment:            The eGFR has been calculated     using the CKD EPI equation.     This calculation has not been     validated in all clinical     situations.     eGFR's persistently     <  90 mL/min signify     possible Chronic Kidney Disease.  RENAL FUNCTION PANEL     Status: Abnormal   Collection Time    04/15/12  4:15 AM      Result Value Range   Sodium 135  135 - 145 mEq/L   Potassium 3.6  3.5 - 5.1 mEq/L   Chloride 99  96 - 112 mEq/L   CO2 27  19 - 32 mEq/L   Glucose, Bld 190 (*) 70 - 99 mg/dL   BUN 11  6 - 23 mg/dL   Creatinine, Ser 4.78 (*) 0.50 - 1.10 mg/dL   Calcium 9.5  8.4 - 29.5 mg/dL   Phosphorus 2.8  2.3 - 4.6 mg/dL   Albumin 3.1 (*) 3.5 - 5.2 g/dL   GFR calc non Af Amer >90  >90 mL/min   GFR calc Af Amer >90  >90 mL/min   Comment:            The eGFR has been calculated     using the CKD EPI equation.     This calculation has not been     validated in all clinical     situations.     eGFR's persistently     <90 mL/min signify     possible Chronic Kidney Disease.  CBC     Status: Abnormal   Collection Time    04/15/12  4:15 AM      Result Value Range   WBC 8.2  4.0 - 10.5 K/uL   RBC 3.78 (*) 3.87 - 5.11 MIL/uL   Hemoglobin 10.8 (*)  12.0 - 15.0 g/dL   HCT 62.1 (*) 30.8 - 65.7 %   MCV 89.7  78.0 - 100.0 fL   MCH 28.6  26.0 - 34.0 pg   MCHC 31.9  30.0 - 36.0 g/dL   RDW 84.6  96.2 - 95.2 %   Platelets 351  150 - 400 K/uL  MAGNESIUM     Status: None   Collection Time    04/15/12  4:15 AM      Result Value Range   Magnesium 1.7  1.5 - 2.5 mg/dL  GLUCOSE, CAPILLARY     Status: Abnormal   Collection Time    04/15/12  8:43 AM      Result Value Range   Glucose-Capillary 181 (*) 70 - 99 mg/dL  GLUCOSE, CAPILLARY     Status: Abnormal   Collection Time    04/15/12 11:47 AM      Result Value Range   Glucose-Capillary 158 (*) 70 - 99 mg/dL  GLUCOSE, CAPILLARY     Status: Abnormal   Collection Time    04/15/12  3:42 PM      Result Value Range   Glucose-Capillary 154 (*) 70 - 99 mg/dL  GLUCOSE, CAPILLARY     Status: Abnormal   Collection Time    04/15/12  8:11 PM      Result Value Range   Glucose-Capillary 185 (*) 70 - 99 mg/dL   Comment 1 Documented in Chart     Comment 2 Notify RN    GLUCOSE, CAPILLARY     Status: Abnormal   Collection Time    04/15/12 11:50 PM      Result Value Range   Glucose-Capillary 154 (*) 70 - 99 mg/dL   Comment 1 Documented in Chart     Comment 2 Notify RN    GLUCOSE, CAPILLARY     Status: None   Collection Time  04/16/12  4:10 AM      Result Value Range   Glucose-Capillary 70  70 - 99 mg/dL   Comment 1 Documented in Chart     Comment 2 Notify RN    RENAL FUNCTION PANEL     Status: Abnormal   Collection Time    04/16/12  6:30 AM      Result Value Range   Sodium 141  135 - 145 mEq/L   Potassium 3.3 (*) 3.5 - 5.1 mEq/L   Chloride 102  96 - 112 mEq/L   CO2 25  19 - 32 mEq/L   Glucose, Bld 82  70 - 99 mg/dL   BUN 9  6 - 23 mg/dL   Creatinine, Ser 7.82 (*) 0.50 - 1.10 mg/dL   Calcium 9.2  8.4 - 95.6 mg/dL   Phosphorus 2.7  2.3 - 4.6 mg/dL   Albumin 3.1 (*) 3.5 - 5.2 g/dL   GFR calc non Af Amer >90  >90 mL/min   GFR calc Af Amer >90  >90 mL/min   Comment:            The  eGFR has been calculated     using the CKD EPI equation.     This calculation has not been     validated in all clinical     situations.     eGFR's persistently     <90 mL/min signify     possible Chronic Kidney Disease.  CBC     Status: Abnormal   Collection Time    04/16/12  6:30 AM      Result Value Range   WBC 8.9  4.0 - 10.5 K/uL   RBC 3.98  3.87 - 5.11 MIL/uL   Hemoglobin 11.3 (*) 12.0 - 15.0 g/dL   HCT 21.3 (*) 08.6 - 57.8 %   MCV 89.4  78.0 - 100.0 fL   MCH 28.4  26.0 - 34.0 pg   MCHC 31.7  30.0 - 36.0 g/dL   RDW 46.9  62.9 - 52.8 %   Platelets 409 (*) 150 - 400 K/uL  MAGNESIUM     Status: None   Collection Time    04/16/12  6:30 AM      Result Value Range   Magnesium 1.6  1.5 - 2.5 mg/dL  GLUCOSE, CAPILLARY     Status: Abnormal   Collection Time    04/16/12  7:02 AM      Result Value Range   Glucose-Capillary 112 (*) 70 - 99 mg/dL   Comment 1 Documented in Chart     Comment 2 Notify RN    GLUCOSE, CAPILLARY     Status: Abnormal   Collection Time    04/16/12  7:30 AM      Result Value Range   Glucose-Capillary 124 (*) 70 - 99 mg/dL  TROPONIN I     Status: None   Collection Time    04/16/12  9:05 AM      Result Value Range   Troponin I <0.30  <0.30 ng/mL   Comment:            Due to the release kinetics of cTnI,     a negative result within the first hours     of the onset of symptoms does not rule out     myocardial infarction with certainty.     If myocardial infarction is still suspected,     repeat the test at appropriate intervals.  BASIC METABOLIC PANEL  Status: Abnormal   Collection Time    04/16/12  9:05 AM      Result Value Range   Sodium 136  135 - 145 mEq/L   Potassium 3.7  3.5 - 5.1 mEq/L   Chloride 97  96 - 112 mEq/L   CO2 24  19 - 32 mEq/L   Glucose, Bld 174 (*) 70 - 99 mg/dL   BUN 9  6 - 23 mg/dL   Creatinine, Ser 9.14 (*) 0.50 - 1.10 mg/dL   Calcium 9.1  8.4 - 78.2 mg/dL   GFR calc non Af Amer >90  >90 mL/min   GFR calc Af Amer  >90  >90 mL/min   Comment:            The eGFR has been calculated     using the CKD EPI equation.     This calculation has not been     validated in all clinical     situations.     eGFR's persistently     <90 mL/min signify     possible Chronic Kidney Disease.  GLUCOSE, CAPILLARY     Status: Abnormal   Collection Time    04/16/12 12:07 PM      Result Value Range   Glucose-Capillary 186 (*) 70 - 99 mg/dL    Ct Head Wo Contrast  04/16/2012  *RADIOLOGY REPORT*  Clinical Data: Altered mental status, cerebral palsy  CT HEAD WITHOUT CONTRAST  Technique:  Contiguous axial images were obtained from the base of the skull through the vertex without contrast.  Comparison: 04/08/2012  Findings: Stable postop changes in the anterior right frontal lobe. Diffuse brain atrophy noted with ventricular enlargement as before. No significant interval change.  No acute intracranial hemorrhage, definite mass lesion, acute infarction, focal edema, mass effect, midline shift, or extra-axial fluid collection.  Cisterns patent. Cerebellar atrophy as well.  Exam is limited because of oblique acquisition.  Chronic periventricular white matter microvascular ischemic changes noted diffusely.  Basal ganglia lacunar infarcts noted bilaterally.  IMPRESSION: Stable chronic atrophy and microvascular ischemic changes as well as postoperative findings.  No acute process by noncontrast CT or significant interval change.   Original Report Authenticated By: Judie Petit. Miles Costain, M.D.    Dg Abd Portable 1v  04/16/2012  *RADIOLOGY REPORT*  Clinical Data: Vomiting.  PORTABLE ABDOMEN - 1 VIEW  Comparison: Abdominal radiographs dated 03/22/2012 and CT scan dated 03/03/2012  Findings: There is minimal air in the bowel.  No dilated loops of bowel.  No visible free air on this supine radiograph.  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  IMPRESSION: Benign-appearing abdomen.   Original Report Authenticated By: Francene Boyers, M.D.       Assessment/Plan: 72 YO female with new change in mental status, eyes deviated to the left and no response to external stimulation.  With patients history of right parietal surgery, significant white matter changes on CT scan and physical findings, seizure is high on the differential. Initial CT head was negative for infarct however changes were noted less than 12 hours ago and may not be visible.  Recommend: 1) Move patient to step down (discussed with Dr. Suanne Marker and she is moving patient) 2) Load with fosphenytoin 1000 mg now, dilantin level in AM and pharmacy to follow 3) Give 1000 mg Keppra now and BID 4)  Agree with EEG  Discussed with Dr. Suanne Marker  Assessment and plan discussed with with attending physician and they are in agreement.    Felicie Morn PA-C  [facility] [phone]  This patient was seen and examined by me, and I approved the assessment and management recommendations.  C.R. Roseanne Reno, MD [facility] [phone]  04/16/2012, 3:59 PM

## 2012-04-16 NOTE — Progress Notes (Signed)
PHARMACY - PHENYTOIN INITIAL CONSULT NOTE  Indication:  New change in mental status.  Neurology note indicates suspicion for seizure  Allergies  Allergen Reactions  . Codeine Nausea And Vomiting  . Morphine And Related Nausea And Vomiting    Patient Measurements: Height: 5\' 3"  (160 cm) Weight: 172 lb 9.9 oz (78.3 kg) IBW/kg (Calculated) : 52.4 Adjusted Body Weight: 60 kg  Vital Signs: Temp: 98.8 F (37.1 C) (03/10 1501) Temp src: Axillary (03/10 1501) BP: 156/92 mmHg (03/10 1501) Pulse Rate: 109 (03/10 1501) Intake/Output from previous day: 03/09 0701 - 03/10 0700 In: 4737 [I.V.:3000] Out: -  Intake/Output from this shift: Total I/O In: 1045.8 [I.V.:1045.8] Out: -   Labs:  Recent Labs  04/14/12 0445 04/15/12 0415 04/16/12 0630 04/16/12 0905  WBC 9.2 8.2 8.9  --   HGB 11.5* 10.8* 11.3*  --   HCT 35.7* 33.9* 35.6*  --   PLT 328 351 409*  --   CREATININE 0.38* 0.38*  0.38* 0.38* 0.38*  MG 1.6 1.7 1.6  --   PHOS 2.8 2.8 2.7  --   ALBUMIN 3.0* 3.1* 3.1*  --    Estimated Creatinine Clearance: 65.8 ml/min (by C-G formula based on Cr of 0.38).    Medications:  Scheduled:  . amLODipine  5 mg Per Tube Daily  . docusate  100 mg Per Tube BID  . enoxaparin (LOVENOX) injection  40 mg Subcutaneous Q24H  . fenofibrate  160 mg Oral Daily  . ferrous sulfate  300 mg Per Tube Q breakfast  . folic acid  1 mg Per Tube Daily  . [COMPLETED] fosPHENYtoin (CEREBYX) IV  1,000 mg PE Intravenous STAT  . hydrocortisone cream   Topical TID  . insulin aspart  0-20 Units Subcutaneous Q4H  . insulin detemir  60 Units Subcutaneous BID  . [COMPLETED] levETIRAcetam  1,000 mg Intravenous Once  . [START ON 04/17/2012] levETIRAcetam  1,000 mg Intravenous Q12H  . [COMPLETED] levETIRAcetam  500 mg Intravenous NOW  . levofloxacin  750 mg Per Tube Daily  . [COMPLETED] LORazepam  1 mg Intravenous Once  . metoprolol tartrate  25 mg Per Tube BID  . multivitamin with minerals  1 tablet Per Tube  Daily  . PARoxetine  40 mg Per Tube q morning - 10a  . ranitidine  150 mg Per Tube Daily  . thiamine  100 mg Per Tube Daily   Or  . thiamine  100 mg Intravenous Daily  . [DISCONTINUED] feeding supplement (JEVITY 1.2 CAL)  237 mL Per Tube 5 X Daily  . [DISCONTINUED] free water  240 mL Per Tube 5 X Daily  . [DISCONTINUED] insulin aspart  3 Units Subcutaneous Q4H  . [DISCONTINUED] levETIRAcetam  500 mg Intravenous Q12H  . [DISCONTINUED] metoprolol tartrate  12.5 mg Per Tube BID    Assessment:  Asked to assist with phenytoin therapy for this 72 year-old female with a new mental status change and suspected seizure.  Ms. Morning in known to Pharmacy from a previous antibiotic consult.  The following anti-seizure medications were administered today 04/16/12:  Keppra 1000 mg at 12:26, Keppra 500 mg at 16:19, and Fosphenytoin 1000 mg at 17:31.  The Fosphenytoin dose is calculated as 16.7 mg/kg using an adjusted weight of 60 kg.   Goal of Therapy:  Total Phenytoin level of 10-20 mcg/ml  Plan:   Evaluate the total phenytoin level ordered for 05:00 on 04/1112.  Order subsequent doses of phenytoin to achieve a level within the therapeutic range.  Polo Riley R.Ph. 04/16/2012 6:13 PM

## 2012-04-16 NOTE — Progress Notes (Signed)
Upon assessment this am pt did not appear to be at her baseline. Pt was not verbally responsive as she has been in the past. Pt also not making eye contact and eyes seem to be drifting to the upper left. Pupils appear to be reactive to light. Pt is bed bound and a left below the elbow amputee so it is difficult to determine change in strength or grip. VS stable, BP 150/91, HR 117, temp 98.3 AX, Resp 28 and sats 100% on RA. Pt also found to be projectile vomiting up tube feeding this am. Pt suctioned to keep airway clear. MD made aware of findings and she came to the bedside to assess pt. Orders received for CT head and Abd xray. Rapid repsonse RN had been at the bedside and she and the charge nurse accompained pt to xray. Will continue to monitor pt for changes.   Arta Bruce [facility] 04/16/2012 8:43 AM

## 2012-04-16 NOTE — Progress Notes (Signed)
PT Cancellation Note  Patient Details Name: [name] MRN: [id number] DOB: 02-Mar-1940   Cancelled Treatment:    Per PT/OT Note from 04/09/12: "Pt is bed bound and has A with all ADL activities. Husband states they have hoyer lift at home if she gets out of bed. PT and OT signing off as pt not appropriate for skilled therapy."   LEMYRE,KATHrine E 04/16/2012, 11:40 AM Pager: [phone]

## 2012-04-16 NOTE — Significant Event (Signed)
Rapid Response Event Note  Overview: Called for altered level of consciousness      Initial Focused Assessment: protecting airway with vomitting, suction as needed, reposition, VS noted.   Interventions: Transport to CT scan with 4th floor charge nurse   Event Summary: Dr Donna Bernard at bedside, see orders.   at      at          [facility], Ferd Hibbs

## 2012-04-16 NOTE — Progress Notes (Signed)
[facility] PROGRESS NOTE  [name]:[id number] DOB: 03/20/1940 DOA: 04/08/2012 PCP: No primary provider on file.  Assessment/Plan:   1).Unresponsivenes -seizure vs CVA - stat CT today of head again negative -Given IV Ativan, and EEG ordered  -Neurology consulted, discussed patient with a Dr. Roseanne Reno who recommends Keppra IV 1000mg  x1 and he'll see patient -follow and transfer to step down if not improving. -Patient also vomiting, will obtain abdominal x-ray, hold off tube feedings and follow   2) Metabolic Encephalopathy in patient with MR - - Most likely multifactorial given Pneumonia and per initial reports metabolic acidosis and possible bacteremia - Initial CT of head interpreted as no acute intracranial abnormality - pt had improved clinically and appeared to be at baseline on 3/9- as per famiily at bedside, but on followup today she was unresponsive as above- please see work up as above  3) HAP - flu negative - Urine legionella neg  - step pneumonia Ag neg - was initially placed on broad spectrum antibiotics - initial chest x ray interpreted as Developing opacity at the left lung base concerning for pneumonia - she subsequently improved clinically and her antibiotics were deescalated to levaquin  And she remaining afebrie and hemodynamically stable  4) Positive blood culture - could likely be contaminant.  Subsequent blood cultures have been negative.  I suspect that initial 1 positive blood culture was a contaminant. - patient is non toxic appearing - Had been Discussed with ID per rounding hospitalist and they agree that initial positive blood culture was likely contaminant.  5) Hypokalemia - replace k    5) Anxiety and depression - continue home regimen paxil  6) HTN, UNCONTROLLED -On norvasc, increase metoprolol added 3/8  - continue hydralazine prn - continue to monitor bp's  7) DM II -better BG control on current regimen, will continue  and monitor  8) Severe protein calorie malnutrition with albumin 1.9 - continue current tube feeds as listed below.    Access: Picc line  IVF: OFF  Proph: lovenox   Code Status: full  Family Communication: spoke with patient and her husband  Disposition Plan:   Appreciate PT/OT/Nutrition assistance. Likely D/c to home with RN, SW, and aid in am.    Consultants:  Dietitian  Procedures:  none  Antibiotics:  Please see above  HPI/Subjective: Per nursing pt has been unresponsive and vomiting up TF this am.  Objective: Filed Vitals:   04/16/12 0700 04/16/12 0745 04/16/12 0808 04/16/12 0900  BP: 164/87 182/74 150/91 169/91  Pulse:  113 117 114  Temp:  98.3 F (36.8 C)    TempSrc:  Axillary    Resp:  16 28   Height:      Weight:      SpO2:  100% 100% 99%    Intake/Output Summary (Last 24 hours) at 04/16/12 0938 Last data filed at 04/16/12 0700  Gross per 24 hour  Intake   4737 ml  Output      0 ml  Net   4737 ml   Filed Weights   04/08/12 1300  Weight: 78.3 kg (172 lb 9.9 oz)    Exam:   General:  Unresponsive, to voice and touch, in no resp distress  HEENT: L.gaze preference, twitching/shaking on L. Side of face and L.upper extremity   Cardiovascular: RRR, no MRG  Respiratory: decreased breath sounds at bases, no wheezes no increased work of breathing  Abdomen: soft, NT, ND  Musculoskeletal: no cyanosis   Data Reviewed: Basic  Metabolic Panel:  Recent Labs Lab 04/12/12 0430 04/13/12 0520 04/14/12 0445 04/15/12 0415 04/16/12 0630  NA 137 137 135 135 141  K 3.4* 3.7 3.7 3.6 3.3*  CL 101 100 98 99 102  CO2 25 26 24 27 25   GLUCOSE 250* 173* 248* 190* 82  BUN 15 13 11 11 9   CREATININE 0.43* 0.43* 0.38* 0.38*  0.38* 0.38*  CALCIUM 9.8 9.6 9.7 9.5 9.2  MG 2.0 1.8 1.6 1.7 1.6  PHOS 3.1 2.7 2.8 2.8 2.7   Liver Function Tests:  Recent Labs Lab 04/12/12 0430 04/13/12 0520 04/14/12 0445 04/15/12 0415 04/16/12 0630  ALBUMIN 2.9* 2.9*  3.0* 3.1* 3.1*   No results found for this basename: LIPASE, AMYLASE,  in the last 168 hours No results found for this basename: AMMONIA,  in the last 168 hours CBC:  Recent Labs Lab 04/12/12 0430 04/13/12 0520 04/14/12 0445 04/15/12 0415 04/16/12 0630  WBC 6.3 7.6 9.2 8.2 8.9  HGB 11.3* 10.8* 11.5* 10.8* 11.3*  HCT 34.9* 33.8* 35.7* 33.9* 35.6*  MCV 89.5 89.7 89.7 89.7 89.4  PLT 256 303 328 351 409*   Cardiac Enzymes: No results found for this basename: CKTOTAL, CKMB, CKMBINDEX, TROPONINI,  in the last 168 hours BNP (last 3 results) No results found for this basename: PROBNP,  in the last 8760 hours CBG:  Recent Labs Lab 04/15/12 2011 04/15/12 2350 04/16/12 0410 04/16/12 0702 04/16/12 0730  GLUCAP 185* 154* 70 112* 124*    Recent Results (from the past 240 hour(s))  CULTURE, BLOOD (ROUTINE X 2)     Status: None   Collection Time    04/08/12  8:30 AM      Result Value Range Status   Specimen Description BLOOD RIGHT ANTECUBITAL   Final   Special Requests BOTTLES DRAWN AEROBIC AND ANAEROBIC 4 CC EA   Final   Culture  Setup Time 04/08/2012 15:22   Final   Culture NO GROWTH 5 DAYS   Final   Report Status 04/14/2012 FINAL   Final  URINE CULTURE     Status: None   Collection Time    04/08/12  8:52 AM      Result Value Range Status   Specimen Description URINE, CATHETERIZED   Final   Special Requests NONE   Final   Culture  Setup Time 04/08/2012 16:32   Final   Colony Count NO GROWTH   Final   Culture NO GROWTH   Final   Report Status 04/09/2012 FINAL   Final  CULTURE, BLOOD (ROUTINE X 2)     Status: None   Collection Time    04/08/12  9:11 AM      Result Value Range Status   Specimen Description BLOOD RIGHT HAND  1 ML IN [facility] BOTTLE   Final   Special Requests NONE   Final   Culture  Setup Time 04/08/2012 15:22   Final   Culture     Final   Value: STAPHYLOCOCCUS SPECIES (COAGULASE NEGATIVE)     Note: THE SIGNIFICANCE OF ISOLATING THIS ORGANISM FROM A SINGLE  SET OF BLOOD CULTURES WHEN MULTIPLE SETS ARE DRAWN IS UNCERTAIN. PLEASE NOTIFY THE MICROBIOLOGY DEPARTMENT WITHIN ONE WEEK IF SPECIATION AND SENSITIVITIES ARE REQUIRED.     Note: Gram Stain Report Called to,Read Back By and Verified With: KRISTY SYKES ON 04/09/2012 AT 9:33P BY WILEJ   Report Status 04/12/2012 FINAL   Final  MRSA PCR SCREENING     Status: None   Collection  Time    04/08/12 11:43 AM      Result Value Range Status   MRSA by PCR NEGATIVE  NEGATIVE Final   Comment:            The GeneXpert MRSA Assay (FDA     approved for NASAL specimens     only), is one component of a     comprehensive MRSA colonization     surveillance program. It is not     intended to diagnose MRSA     infection nor to guide or     monitor treatment for     MRSA infections.  CULTURE, BLOOD (ROUTINE X 2)     Status: None   Collection Time    04/10/12  8:10 AM      Result Value Range Status   Specimen Description BLOOD LEFT ARM   Final   Special Requests BOTTLES DRAWN AEROBIC AND ANAEROBIC 5CC   Final   Culture  Setup Time 04/10/2012 13:46   Final   Culture     Final   Value:        BLOOD CULTURE RECEIVED NO GROWTH TO DATE CULTURE WILL BE HELD FOR 5 DAYS BEFORE ISSUING A FINAL NEGATIVE REPORT   Report Status PENDING   Incomplete  CULTURE, BLOOD (ROUTINE X 2)     Status: None   Collection Time    04/10/12  8:20 AM      Result Value Range Status   Specimen Description BLOOD LEFT ARM   Final   Special Requests BOTTLES DRAWN AEROBIC ONLY 1CC   Final   Culture  Setup Time 04/10/2012 13:46   Final   Culture     Final   Value:        BLOOD CULTURE RECEIVED NO GROWTH TO DATE CULTURE WILL BE HELD FOR 5 DAYS BEFORE ISSUING A FINAL NEGATIVE REPORT   Report Status PENDING   Incomplete     Studies: Ct Head Wo Contrast  04/16/2012  *RADIOLOGY REPORT*  Clinical Data: Altered mental status, cerebral palsy  CT HEAD WITHOUT CONTRAST  Technique:  Contiguous axial images were obtained from the base of the skull  through the vertex without contrast.  Comparison: 04/08/2012  Findings: Stable postop changes in the anterior right frontal lobe. Diffuse brain atrophy noted with ventricular enlargement as before. No significant interval change.  No acute intracranial hemorrhage, definite mass lesion, acute infarction, focal edema, mass effect, midline shift, or extra-axial fluid collection.  Cisterns patent. Cerebellar atrophy as well.  Exam is limited because of oblique acquisition.  Chronic periventricular white matter microvascular ischemic changes noted diffusely.  Basal ganglia lacunar infarcts noted bilaterally.  IMPRESSION: Stable chronic atrophy and microvascular ischemic changes as well as postoperative findings.  No acute process by noncontrast CT or significant interval change.   Original Report Authenticated By: Judie Petit. Miles Costain, M.D.    Dg Abd Portable 1v  04/16/2012  *RADIOLOGY REPORT*  Clinical Data: Vomiting.  PORTABLE ABDOMEN - 1 VIEW  Comparison: Abdominal radiographs dated 03/22/2012 and CT scan dated 03/03/2012  Findings: There is minimal air in the bowel.  No dilated loops of bowel.  No visible free air on this supine radiograph.  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  IMPRESSION: Benign-appearing abdomen.   Original Report Authenticated By: Francene Boyers, M.D.     Scheduled Meds: . amLODipine  5 mg Per Tube Daily  . docusate  100 mg Per Tube BID  . enoxaparin (LOVENOX) injection  40 mg Subcutaneous  Q24H  . feeding supplement (JEVITY 1.2 CAL)  237 mL Per Tube 5 X Daily  . fenofibrate  160 mg Oral Daily  . ferrous sulfate  300 mg Per Tube Q breakfast  . folic acid  1 mg Per Tube Daily  . free water  240 mL Per Tube 5 X Daily  . hydrocortisone cream   Topical TID  . insulin aspart  0-20 Units Subcutaneous Q4H  . insulin aspart  3 Units Subcutaneous Q4H  . insulin detemir  60 Units Subcutaneous BID  . levofloxacin  750 mg Per Tube Daily  . LORazepam  1 mg Intravenous Once  . metoprolol  tartrate  25 mg Per Tube BID  . multivitamin with minerals  1 tablet Per Tube Daily  . PARoxetine  40 mg Per Tube q morning - 10a  . ranitidine  150 mg Per Tube Daily  . thiamine  100 mg Per Tube Daily   Or  . thiamine  100 mg Intravenous Daily   Continuous Infusions: . sodium chloride 1,000 mL (04/16/12 0743)    Active Problems:   Constipation   Fever   Anxiety and depression   Dyslipidemia   Diabetes mellitus   Sinus tachycardia   HCAP (healthcare-associated pneumonia)   Normocytic anemia   Hypokalemia   Metabolic acidosis, normal anion gap (NAG)   Hypocalcemia   Hypomagnesemia   Protein-calorie malnutrition, severe   Encephalopathy, metabolic   Elevated blood pressure   HTN (hypertension)    Time spent: > 35 minutes    Wyett Narine C  [facility] Pager [phone] If 7PM-7AM, please contact night-coverage at [email or web address], password [facility] 04/16/2012, 9:38 AM  LOS: 8 days

## 2012-04-17 ENCOUNTER — Inpatient Hospital Stay (HOSPITAL_COMMUNITY)
Admit: 2012-04-17 | Discharge: 2012-04-17 | Disposition: A | Discharge status: Home / Self Care | Payer: 59 | Attending: Internal Medicine | Admitting: Internal Medicine

## 2012-04-17 LAB — CBC
MCH: 28.7 pg (ref 26.0–34.0)
MCHC: 32.3 g/dL (ref 30.0–36.0)
MCV: 88.9 fL (ref 78.0–100.0)
Platelets: 334 10*3/uL (ref 150–400)
RBC: 3.41 MIL/uL — ABNORMAL LOW (ref 3.87–5.11)

## 2012-04-17 LAB — RENAL FUNCTION PANEL
Albumin: 2.7 g/dL — ABNORMAL LOW (ref 3.5–5.2)
BUN: 12 mg/dL (ref 6–23)
Calcium: 8.5 mg/dL (ref 8.4–10.5)
Creatinine, Ser: 0.5 mg/dL (ref 0.50–1.10)
GFR calc non Af Amer: >90 mL/min (ref >90)

## 2012-04-17 LAB — URINE CULTURE
Culture: NO GROWTH
Special Requests: NORMAL

## 2012-04-17 LAB — MAGNESIUM: Magnesium: 1.7 mg/dL (ref 1.5–2.5)

## 2012-04-17 MED ORDER — JEVITY 1.2 CAL PO LIQD: 1000.0000 mL | ORAL | Status: DC

## 2012-04-17 MED ORDER — FREE WATER
200.0000 mL | Status: DC
  Administered 2012-04-17 – 2012-04-21 (×23): 200 mL

## 2012-04-17 MED ORDER — PHENYTOIN SODIUM EXTENDED 100 MG PO CAPS
300.0000 mg | ORAL_CAPSULE | Freq: Every day | ORAL | Status: DC
  Filled 2012-04-17: qty 3

## 2012-04-17 MED ORDER — BIOTENE DRY MOUTH MT LIQD
15.0000 mL | Freq: Two times a day (BID) | OROMUCOSAL | Status: DC
  Administered 2012-04-18 – 2012-04-20 (×5): 15 mL via OROMUCOSAL

## 2012-04-17 MED ORDER — VITAMINS A & D EX OINT
TOPICAL_OINTMENT | CUTANEOUS | Status: AC
  Administered 2012-04-17: 08:00:00
  Filled 2012-04-17: qty 5

## 2012-04-17 MED ORDER — SODIUM CHLORIDE 0.9 % IV SOLN
1000.0000 mL | INTRAVENOUS | Status: DC
  Administered 2012-04-18: 1000 mL via INTRAVENOUS

## 2012-04-17 MED ORDER — JEVITY 1.2 CAL PO LIQD
1000.0000 mL | ORAL | Status: DC
  Administered 2012-04-18 – 2012-04-20 (×3): 1000 mL
  Filled 2012-04-17 (×4): qty 1000

## 2012-04-17 MED ORDER — JEVITY 1.2 CAL PO LIQD
1000.0000 mL | ORAL | Status: DC
  Administered 2012-04-17: 1000 mL

## 2012-04-17 MED ORDER — SODIUM CHLORIDE 0.9 % IV SOLN
300.0000 mg | Freq: Every day | INTRAVENOUS | Status: DC
  Administered 2012-04-17 – 2012-04-21 (×5): 300 mg via INTRAVENOUS
  Filled 2012-04-17 (×7): qty 6

## 2012-04-17 MED ORDER — CHLORHEXIDINE GLUCONATE 0.12 % MT SOLN
15.0000 mL | Freq: Two times a day (BID) | OROMUCOSAL | Status: DC
  Administered 2012-04-18 – 2012-04-21 (×7): 15 mL via OROMUCOSAL
  Filled 2012-04-17 (×9): qty 15

## 2012-04-17 NOTE — Progress Notes (Signed)
MEDICATION RELATED CONSULT NOTE - FOLLOW UP   Pharmacy Consult for Phenytoin Indication: Unresponsiveness, suspected seizure  Allergies  Allergen Reactions  . Codeine Nausea And Vomiting  . Morphine And Related Nausea And Vomiting    Patient Measurements: Height: 5\' 3"  (160 cm) Weight: 164 lb 14.5 oz (74.8 kg) IBW/kg (Calculated) : 52.4 Adjusted Body Weight: 61.4kg  Vital Signs: Temp: 99.4 F (37.4 C) (03/11 1210) Temp src: Axillary (03/11 1210) BP: 117/56 mmHg (03/11 1200) Pulse Rate: 79 (03/11 1200) Intake/Output from previous day: 03/10 0701 - 03/11 0700 In: 3955 [I.V.:3125; IV Piggyback:830] Out: 345 [Urine:345] Intake/Output from this shift: Total I/O In: 847.5 [I.V.:550; Other:110; IV Piggyback:187.5] Out: 500 [Urine:500]  Labs:  Recent Labs  04/15/12 0415 04/16/12 0630 04/16/12 0905 04/17/12 0350  WBC 8.2 8.9  --  9.1  HGB 10.8* 11.3*  --  9.8*  HCT 33.9* 35.6*  --  30.3*  PLT 351 409*  --  334  CREATININE 0.38*  0.38* 0.38* 0.38* 0.50  MG 1.7 1.6  --  1.7  PHOS 2.8 2.7  --  3.0  ALBUMIN 3.1* 3.1*  --  2.7*  2.7*   Estimated Creatinine Clearance: 64.3 ml/min (by C-G formula based on Cr of 0.5).   Microbiology: Recent Results (from the past 720 hour(s))  CULTURE, BLOOD (ROUTINE X 2)     Status: None   Collection Time    04/08/12  8:30 AM      Result Value Range Status   Specimen Description BLOOD RIGHT ANTECUBITAL   Final   Special Requests BOTTLES DRAWN AEROBIC AND ANAEROBIC 4 CC EA   Final   Culture  Setup Time 04/08/2012 15:22   Final   Culture NO GROWTH 5 DAYS   Final   Report Status 04/14/2012 FINAL   Final  URINE CULTURE     Status: None   Collection Time    04/08/12  8:52 AM      Result Value Range Status   Specimen Description URINE, CATHETERIZED   Final   Special Requests NONE   Final   Culture  Setup Time 04/08/2012 16:32   Final   Colony Count NO GROWTH   Final   Culture NO GROWTH   Final   Report Status 04/09/2012 FINAL    Final  CULTURE, BLOOD (ROUTINE X 2)     Status: None   Collection Time    04/08/12  9:11 AM      Result Value Range Status   Specimen Description BLOOD RIGHT HAND  1 ML IN [facility] BOTTLE   Final   Special Requests NONE   Final   Culture  Setup Time 04/08/2012 15:22   Final   Culture     Final   Value: STAPHYLOCOCCUS SPECIES (COAGULASE NEGATIVE)     Note: THE SIGNIFICANCE OF ISOLATING THIS ORGANISM FROM A SINGLE SET OF BLOOD CULTURES WHEN MULTIPLE SETS ARE DRAWN IS UNCERTAIN. PLEASE NOTIFY THE MICROBIOLOGY DEPARTMENT WITHIN ONE WEEK IF SPECIATION AND SENSITIVITIES ARE REQUIRED.     Note: Gram Stain Report Called to,Read Back By and Verified With: KRISTY SYKES ON 04/09/2012 AT 9:33P BY WILEJ   Report Status 04/12/2012 FINAL   Final  MRSA PCR SCREENING     Status: None   Collection Time    04/08/12 11:43 AM      Result Value Range Status   MRSA by PCR NEGATIVE  NEGATIVE Final   Comment:            The  GeneXpert MRSA Assay (FDA     approved for NASAL specimens     only), is one component of a     comprehensive MRSA colonization     surveillance program. It is not     intended to diagnose MRSA     infection nor to guide or     monitor treatment for     MRSA infections.  CULTURE, BLOOD (ROUTINE X 2)     Status: None   Collection Time    04/10/12  8:10 AM      Result Value Range Status   Specimen Description BLOOD LEFT ARM   Final   Special Requests BOTTLES DRAWN AEROBIC AND ANAEROBIC 5CC   Final   Culture  Setup Time 04/10/2012 13:46   Final   Culture NO GROWTH 5 DAYS   Final   Report Status 04/16/2012 FINAL   Final  CULTURE, BLOOD (ROUTINE X 2)     Status: None   Collection Time    04/10/12  8:20 AM      Result Value Range Status   Specimen Description BLOOD LEFT ARM   Final   Special Requests BOTTLES DRAWN AEROBIC ONLY 1CC   Final   Culture  Setup Time 04/10/2012 13:46   Final   Culture NO GROWTH 5 DAYS   Final   Report Status 04/16/2012 FINAL   Final    Medications:  3/10:  Keppra 1g IV x 1 @ 1226 3/10: Keppra 500mg  IV x 1 @ 1619 3/10: Fosphenytoin 1g x 1 @ 1731 3/11: Keppra 1g q 12 hours (started 0416) 3/11: Phenytoin 300mg  IV q 24 hours (started 1500)  Assessment: 29 yoF with metabolic encephalopathy with MR has acute episode of non-responsiveness. Hx right parietal surgery.  Neurology consulted and loading doses of keppra and fosphenytoin administered.   Pharmacy asked to assist in dosing phenytoin.   CT scan results: new change in mental status, eyes deviated to the left and no response to external stimulation. With patient's history of right parietal surgery, significant white matter changes on CT scan and physical findings, seizure is high on the differential. Initial CT head was negative for infarct however changes were noted less than 12 hours ago and may not be visible."  EEG pending  Phenytoin level = 9.9, albumin 2.7, corrected phenytoin = 15.5  CrCl >10 ml/min  No significant DDIs noted  Goal of Therapy:  Phenytoin level 10-20 mcg/ml  Plan:  1.  Begin phenytoin 300mg  IV daily.   2.  F/u phenytoin level in 5-7 days or sooner as appropriate.  3.  Monitoring for IV use: Continuous cardiac monitoring and observation during administration and BP, pulse q 15 minutes x 1 hour after administration 4.  F/u renal function and concomitant medications for DDIs  Haynes Hoehn, PharmD 04/17/2012 2:45 PM  Pager: [phone]

## 2012-04-17 NOTE — Progress Notes (Signed)
[facility] PROGRESS NOTE  SHAQUETA CASADY WUJ:[id number] DOB: 29-May-1940 DOA: 04/08/2012 PCP: No primary Hermon Zea on file.  BRIEF HISTORY The patient is a 71 y.o. year-old female with history of CP, possible MR, Parkinsonism, DM, renal cancer, dysphagia with recurrent aspiration pneumonia, bladder spasms who presented with altered mental status and fever. The patient had been at her baseline health until the last two days prior to admission. Per her husband who is her primary care taker, she had subjective fevers on day prior to admission and had been sleepier than usual. Also it was reported that her CBGs had been elevated recently. In the ER, she was noted to have hgb 8.8mg /dl, potassium 2.5, CO2 17, calcium 5.8. CT head was neg for acute process, but CXR demonstrated LLL opacity. WBC 5.9 and afebrile in ER. UDS was positive for narcotics (takes vicodin at home) but negative for benzos.She was admitted for further evaluation and management.   Assessment/Plan:   1).Unresponsivenes-likely secondary to seizure -Pt  Had improved back to baseline following admission with metabolic encephalopatehy, but was noted to be unresponsive on am of 3/10,  - stat CT on 3/10  of head again negative - She was Given IV Ativan, and EEG ordered 3/10and is still pending to date, -Neurology consulted,  Dr. Roseanne Reno who recommended Keppra IV 1000mg  x1 and upon evaluating pt she she was given another 1000mg   and loaded with fosphenytoin >>transferred to step down for close monitoring and started daily on dilantin -Patient was also vomiting>> TF held>>abd xray neg for acute findings. Husband was updated 3/10 -Pt remains unresponsive this am  But no further L. Sided twitching/shaking -continue current meds as above, follow up on EEG -appreciate Neuro assistance.  2) Metabolic Encephalopathy in patient with CP/MR  - on admission the impression was that this was Most likely multifactorial given Pneumonia and  per initial reports metabolic acidosis and possible bacteremia - Initial CT of head interpreted as no acute intracranial abnormality - pt had improved clinically and appeared to be at baseline on 3/9- as per famiily at bedside, but on followup on 3/10 she was unresponsive as above- please see work up as above  3) HAP - flu negative - Urine legionella neg  - step pneumonia Ag neg - was initially placed on broad spectrum antibiotics - initial chest x ray interpreted as Developing opacity at the left lung base concerning for pneumonia, - she subsequently improved clinically and her antibiotics were deescalated to levaquin  And she remaining afebrie and hemodynamically stable - follow CXR this am neg for acute infiltrates or effusion  4) Positive blood culture - was thought be contaminant.  Subsequent blood cultures have been negative.  - Had been Discussed with ID per rounding hospitalist and they agreed that initial 1 positive blood culture was likely contaminant. - follow up on the pending blood cultures ordered 3/10   5) Hypokalemia - resolved, s/p repletion    5) Anxiety and depression - continue home regimen paxil  6) HTN, UNCONTROLLED -continue norvasc, and metoprolol (added this hospitalization) - continue hydralazine prn - continue to monitor bp's and further  7) DM II -hold levemir until tolerating TF again then resume, continue SSI  8) Severe protein calorie malnutrition with albumin 1.9/Nutrition - resume tube feeds and slowly advance back to goal as previously    Access: Picc line  IVF: OFF  Proph: lovenox   Code Status: full  Family Communication:  Disposition Plan:  Continue monitoring in step down  Consultants:  Dietitian  Procedures:  none  Antibiotics:  Please see above  HPI/Subjective: Per nursing pt still unresponsive this am, no further vomiting  Objective: Filed Vitals:   04/17/12 0000 04/17/12 0200 04/17/12 0400 04/17/12 0600  BP:  127/60 125/52 124/48 129/50  Pulse: 107 102 95 96  Temp: 101.3 F (38.5 C) 100.6 F (38.1 C) 99.1 F (37.3 C) 99 F (37.2 C)  TempSrc: Core (Comment)     Resp: 19 37 29 18  Height:      Weight:   74.8 kg (164 lb 14.5 oz)   SpO2: 96% 95% 96% 93%    Intake/Output Summary (Last 24 hours) at 04/17/12 0842 Last data filed at 04/17/12 0800  Gross per 24 hour  Intake   3980 ml  Output    405 ml  Net   3575 ml   Filed Weights   04/08/12 1300 04/16/12 1745 04/17/12 0400  Weight: 78.3 kg (172 lb 9.9 oz) 78.3 kg (172 lb 9.9 oz) 74.8 kg (164 lb 14.5 oz)    Exam:   General: remains unresponsive, to voice and touch, s.snoring.  HEENT: no further l.sided twitching  Cardiovascular: RRR, no MRG  Respiratory: decreased breath sounds at bases, no wheezes, no increased work of breathing  Abdomen: soft, NT, ND +BS  Musculoskeletal: no cyanosis, trace-+1 UE edema. LE no edema   Data Reviewed: Basic Metabolic Panel:  Recent Labs Lab 04/13/12 0520 04/14/12 0445 04/15/12 0415 04/16/12 0630 04/16/12 0905 04/17/12 0350  NA 137 135 135 141 136 136  K 3.7 3.7 3.6 3.3* 3.7 3.5  CL 100 98 99 102 97 101  CO2 26 24 27 25 24 25   GLUCOSE 173* 248* 190* 82 174* 130*  BUN 13 11 11 9 9 12   CREATININE 0.43* 0.38* 0.38*  0.38* 0.38* 0.38* 0.50  CALCIUM 9.6 9.7 9.5 9.2 9.1 8.5  MG 1.8 1.6 1.7 1.6  --  1.7  PHOS 2.7 2.8 2.8 2.7  --  3.0   Liver Function Tests:  Recent Labs Lab 04/13/12 0520 04/14/12 0445 04/15/12 0415 04/16/12 0630 04/17/12 0350  ALBUMIN 2.9* 3.0* 3.1* 3.1* 2.7*  2.7*   No results found for this basename: LIPASE, AMYLASE,  in the last 168 hours No results found for this basename: AMMONIA,  in the last 168 hours CBC:  Recent Labs Lab 04/13/12 0520 04/14/12 0445 04/15/12 0415 04/16/12 0630 04/17/12 0350  WBC 7.6 9.2 8.2 8.9 9.1  HGB 10.8* 11.5* 10.8* 11.3* 9.8*  HCT 33.8* 35.7* 33.9* 35.6* 30.3*  MCV 89.7 89.7 89.7 89.4 88.9  PLT 303 328 351 409* 334    Cardiac Enzymes:  Recent Labs Lab 04/16/12 0905 04/16/12 1500 04/16/12 2200  TROPONINI <0.30 <0.30 <0.30   BNP (last 3 results) No results found for this basename: PROBNP,  in the last 8760 hours CBG:  Recent Labs Lab 04/16/12 1626 04/16/12 2013 04/16/12 2343 04/17/12 0348 04/17/12 0753  GLUCAP 176* 177* 170* 118* 96    Recent Results (from the past 240 hour(s))  CULTURE, BLOOD (ROUTINE X 2)     Status: None   Collection Time    04/08/12  8:30 AM      Result Value Range Status   Specimen Description BLOOD RIGHT ANTECUBITAL   Final   Special Requests BOTTLES DRAWN AEROBIC AND ANAEROBIC 4 CC EA   Final   Culture  Setup Time 04/08/2012 15:22   Final   Culture NO GROWTH 5 DAYS   Final  Report Status 04/14/2012 FINAL   Final  URINE CULTURE     Status: None   Collection Time    04/08/12  8:52 AM      Result Value Range Status   Specimen Description URINE, CATHETERIZED   Final   Special Requests NONE   Final   Culture  Setup Time 04/08/2012 16:32   Final   Colony Count NO GROWTH   Final   Culture NO GROWTH   Final   Report Status 04/09/2012 FINAL   Final  CULTURE, BLOOD (ROUTINE X 2)     Status: None   Collection Time    04/08/12  9:11 AM      Result Value Range Status   Specimen Description BLOOD RIGHT HAND  1 ML IN [facility] BOTTLE   Final   Special Requests NONE   Final   Culture  Setup Time 04/08/2012 15:22   Final   Culture     Final   Value: STAPHYLOCOCCUS SPECIES (COAGULASE NEGATIVE)     Note: THE SIGNIFICANCE OF ISOLATING THIS ORGANISM FROM A SINGLE SET OF BLOOD CULTURES WHEN MULTIPLE SETS ARE DRAWN IS UNCERTAIN. PLEASE NOTIFY THE MICROBIOLOGY DEPARTMENT WITHIN ONE WEEK IF SPECIATION AND SENSITIVITIES ARE REQUIRED.     Note: Gram Stain Report Called to,Read Back By and Verified With: KRISTY SYKES ON 04/09/2012 AT 9:33P BY WILEJ   Report Status 04/12/2012 FINAL   Final  MRSA PCR SCREENING     Status: None   Collection Time    04/08/12 11:43 AM      Result Value  Range Status   MRSA by PCR NEGATIVE  NEGATIVE Final   Comment:            The GeneXpert MRSA Assay (FDA     approved for NASAL specimens     only), is one component of a     comprehensive MRSA colonization     surveillance program. It is not     intended to diagnose MRSA     infection nor to guide or     monitor treatment for     MRSA infections.  CULTURE, BLOOD (ROUTINE X 2)     Status: None   Collection Time    04/10/12  8:10 AM      Result Value Range Status   Specimen Description BLOOD LEFT ARM   Final   Special Requests BOTTLES DRAWN AEROBIC AND ANAEROBIC 5CC   Final   Culture  Setup Time 04/10/2012 13:46   Final   Culture NO GROWTH 5 DAYS   Final   Report Status 04/16/2012 FINAL   Final  CULTURE, BLOOD (ROUTINE X 2)     Status: None   Collection Time    04/10/12  8:20 AM      Result Value Range Status   Specimen Description BLOOD LEFT ARM   Final   Special Requests BOTTLES DRAWN AEROBIC ONLY 1CC   Final   Culture  Setup Time 04/10/2012 13:46   Final   Culture NO GROWTH 5 DAYS   Final   Report Status 04/16/2012 FINAL   Final     Studies: Ct Head Wo Contrast  04/16/2012  *RADIOLOGY REPORT*  Clinical Data: Altered mental status, cerebral palsy  CT HEAD WITHOUT CONTRAST  Technique:  Contiguous axial images were obtained from the base of the skull through the vertex without contrast.  Comparison: 04/08/2012  Findings: Stable postop changes in the anterior right frontal lobe. Diffuse brain atrophy noted with ventricular  enlargement as before. No significant interval change.  No acute intracranial hemorrhage, definite mass lesion, acute infarction, focal edema, mass effect, midline shift, or extra-axial fluid collection.  Cisterns patent. Cerebellar atrophy as well.  Exam is limited because of oblique acquisition.  Chronic periventricular white matter microvascular ischemic changes noted diffusely.  Basal ganglia lacunar infarcts noted bilaterally.  IMPRESSION: Stable chronic atrophy  and microvascular ischemic changes as well as postoperative findings.  No acute process by noncontrast CT or significant interval change.   Original Report Authenticated By: Judie Petit. Miles Costain, M.D.    Dg Chest Port 1 View  04/17/2012  *RADIOLOGY REPORT*  Clinical Data: Weakness, shortness of breath  PORTABLE CHEST - 1 VIEW  Comparison: Portable chest x-ray of 04/11/2012  Findings: There is still slight elevation of the right hemidiaphragm.  No infiltrate or effusion is seen.  The right PICC line is unchanged in position, with the tip overlying the mid upper SVC.  Heart size is stable.  IMPRESSION: No active infiltrate or effusion.  No change in right PICC line position.   Original Report Authenticated By: Dwyane Dee, M.D.    Dg Abd Portable 1v  04/16/2012  *RADIOLOGY REPORT*  Clinical Data: Vomiting.  PORTABLE ABDOMEN - 1 VIEW  Comparison: Abdominal radiographs dated 03/22/2012 and CT scan dated 03/03/2012  Findings: There is minimal air in the bowel.  No dilated loops of bowel.  No visible free air on this supine radiograph.  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  IMPRESSION: Benign-appearing abdomen.   Original Report Authenticated By: Francene Boyers, M.D.     Scheduled Meds: . amLODipine  5 mg Per Tube Daily  . docusate  100 mg Per Tube BID  . enoxaparin (LOVENOX) injection  40 mg Subcutaneous Q24H  . fenofibrate  160 mg Oral Daily  . ferrous sulfate  300 mg Per Tube Q breakfast  . folic acid  1 mg Per Tube Daily  . hydrocortisone cream   Topical TID  . insulin aspart  0-15 Units Subcutaneous Q4H  . levETIRAcetam  1,000 mg Intravenous Q12H  . metoprolol tartrate  25 mg Per Tube BID  . multivitamin with minerals  1 tablet Per Tube Daily  . PARoxetine  40 mg Per Tube q morning - 10a  . piperacillin-tazobactam (ZOSYN)  IV  3.375 g Intravenous Q8H  . ranitidine  150 mg Per Tube Daily  . thiamine  100 mg Per Tube Daily   Or  . thiamine  100 mg Intravenous Daily  . vancomycin  750 mg  Intravenous Q12H   Continuous Infusions: . sodium chloride      Active Problems:   Constipation   Fever   Anxiety and depression   Dyslipidemia   Diabetes mellitus   Sinus tachycardia   HCAP (healthcare-associated pneumonia)   Normocytic anemia   Hypokalemia   Metabolic acidosis, normal anion gap (NAG)   Hypocalcemia   Hypomagnesemia   Protein-calorie malnutrition, severe   Encephalopathy, metabolic   Elevated blood pressure   HTN (hypertension)    Time spent: > 35 minutes    VIYUOH,ADELINE C  [facility] Pager [phone] If 7PM-7AM, please contact night-coverage at [email or web address], password [facility] 04/17/2012, 8:42 AM  LOS: 9 days

## 2012-04-17 NOTE — Progress Notes (Signed)
NEURO HOSPITALIST PROGRESS NOTE   SUBJECTIVE:                                                                                                                        Patient is awake and able to tell me she is at [facility], she follows most commands.    OBJECTIVE:                                                                                                                           Vital signs in last 24 hours: Temp:  [99 F (37.2 C)-103.4 F (39.7 C)] 99.4 F (37.4 C) (03/11 1210) Pulse Rate:  [79-118] 84 (03/11 1556) Resp:  [13-37] 13 (03/11 1556) BP: (117-152)/(48-107) 128/53 mmHg (03/11 1500) SpO2:  [93 %-99 %] 99 % (03/11 1556) Weight:  [74.8 kg (164 lb 14.5 oz)-78.3 kg (172 lb 9.9 oz)] 74.8 kg (164 lb 14.5 oz) (03/11 0400)  Intake/Output from previous day: 03/10 0701 - 03/11 0700 In: 3955 [I.V.:3125; IV Piggyback:830] Out: 345 [Urine:345] Intake/Output this shift: Total I/O In: 847.5 [I.V.:550; Other:110; IV Piggyback:187.5] Out: 850 [Urine:850] Nutritional status: NPO  Past Medical History  Diagnosis Date  . Diabetes mellitus   . Renal disorder   . CP (cerebral palsy)   . Renal cancer     s/p nephrectomy  . Parkinsonism   . Dysphagia   . Recurrent aspiration pneumonia   . Bladder spasms   . Amputation of arm   . Recurrent UTI      Neurologic Exam:   Mental Status: Alert, oriented, speaks intermittently.  Speech fluent without evidence of aphasia.  Able to follow simple commands without difficulty. Cranial Nerves: II: Visual fields grossly normal, pupils equal, round, reactive to light and accommodation III,IV, VI: ptosis present left eye, extra-ocular motions intact bilaterally V,VII: smile symmetric, facial light touch sensation normal bilaterally VIII: hearing normal bilaterally IX,X: gag reflex present XI: bilateral shoulder shrug XII: midline tongue extension Motor: Bilateral UE shows a postural  tremor and action tremor.  She is able to minimally raise both arms.  Increased tone bilateral UE, normal tone right LE and left leg is fused.  Tone and bulk:normal tone throughout; no atrophy noted Sensory: Pinprick and  light touch intact throughout, bilaterally Deep Tendon Reflexes: 2+ and symmetric throughout UE with no KJ or AJ bilaterally Plantars: Mute bilaterally   Lab Results: No results found for this basename: cbc, bmp, coags, chol, tri, ldl, hga1c   Lipid Panel No results found for this basename: CHOL, TRIG, HDL, CHOLHDL, VLDL, LDLCALC,  in the last 72 hours  Studies/Results: Ct Head Wo Contrast  04/16/2012  *RADIOLOGY REPORT*  Clinical Data: Altered mental status, cerebral palsy  CT HEAD WITHOUT CONTRAST  Technique:  Contiguous axial images were obtained from the base of the skull through the vertex without contrast.  Comparison: 04/08/2012  Findings: Stable postop changes in the anterior right frontal lobe. Diffuse brain atrophy noted with ventricular enlargement as before. No significant interval change.  No acute intracranial hemorrhage, definite mass lesion, acute infarction, focal edema, mass effect, midline shift, or extra-axial fluid collection.  Cisterns patent. Cerebellar atrophy as well.  Exam is limited because of oblique acquisition.  Chronic periventricular white matter microvascular ischemic changes noted diffusely.  Basal ganglia lacunar infarcts noted bilaterally.  IMPRESSION: Stable chronic atrophy and microvascular ischemic changes as well as postoperative findings.  No acute process by noncontrast CT or significant interval change.   Original Report Authenticated By: Judie Petit. Miles Costain, M.D.    Dg Chest Port 1 View  04/17/2012  *RADIOLOGY REPORT*  Clinical Data: Weakness, shortness of breath  PORTABLE CHEST - 1 VIEW  Comparison: Portable chest x-ray of 04/11/2012  Findings: There is still slight elevation of the right hemidiaphragm.  No infiltrate or effusion is seen.  The  right PICC line is unchanged in position, with the tip overlying the mid upper SVC.  Heart size is stable.  IMPRESSION: No active infiltrate or effusion.  No change in right PICC line position.   Original Report Authenticated By: Dwyane Dee, M.D.    Dg Abd Portable 1v  04/16/2012  *RADIOLOGY REPORT*  Clinical Data: Vomiting.  PORTABLE ABDOMEN - 1 VIEW  Comparison: Abdominal radiographs dated 03/22/2012 and CT scan dated 03/03/2012  Findings: There is minimal air in the bowel.  No dilated loops of bowel.  No visible free air on this supine radiograph.  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  IMPRESSION: Benign-appearing abdomen.   Original Report Authenticated By: Francene Boyers, M.D.     MEDICATIONS                                                                                                                        Scheduled: . amLODipine  5 mg Per Tube Daily  . docusate  100 mg Per Tube BID  . enoxaparin (LOVENOX) injection  40 mg Subcutaneous Q24H  . fenofibrate  160 mg Oral Daily  . ferrous sulfate  300 mg Per Tube Q breakfast  . folic acid  1 mg Per Tube Daily  . hydrocortisone cream   Topical TID  . insulin aspart  0-15 Units Subcutaneous Q4H  . levETIRAcetam  1,000 mg Intravenous Q12H  .  metoprolol tartrate  25 mg Per Tube BID  . multivitamin with minerals  1 tablet Per Tube Daily  . PARoxetine  40 mg Per Tube q morning - 10a  . phenytoin (DILANTIN) IV  300 mg Intravenous Q1500  . piperacillin-tazobactam (ZOSYN)  IV  3.375 g Intravenous Q8H  . ranitidine  150 mg Per Tube Daily  . thiamine  100 mg Per Tube Daily   Or  . thiamine  100 mg Intravenous Daily  . vancomycin  750 mg Intravenous Q12H    ASSESSMENT/PLAN:                                                                                                             Seizure: Patient showing no further seizure activity.  Corrected Dilantin is 15.  EEG pending.    Recommend: 1) Continue Dilantin and Keppra with  pharmacy following Dilantin. 2) EEG pending.     Assessment and plan discussed with with attending physician and they are in agreement.    Felicie Morn PA-C [facility] [phone]  04/17/2012, 4:02 PM

## 2012-04-17 NOTE — Progress Notes (Signed)
Patient moved to ICU. No current CSW needs. Will report to ICU CSW if any future needs should arise.  Bethany C. Corcoran MSW, LCSW [phone]

## 2012-04-17 NOTE — Progress Notes (Signed)
Nutrition Brief Note  Called pt's husband who stated that pt was on continuous tube feedings at 55 ml/hr PTA and power is back on at pt's house. Recommend continuous feeds instead of bolus.  Recommend restarting Jevity 1.2 @ 20 ml/hr via PEG and increase by 10 ml every 4 hours to goal rate of 55 ml/hr. At goal rate, tube feeding regimen will provide 1584 kcal, 75 grams of protein, and 1070 ml of H2O. Recommend 200 ml water flushes q 4 hrs.  Ian Malkin RD, LDN Inpatient Clinical Dietitian Pager: [phone] After Hours Pager: [phone]

## 2012-04-17 NOTE — Progress Notes (Signed)
EEG completed at bedside 

## 2012-04-17 NOTE — Progress Notes (Signed)
[facility] PROGRESS NOTE  [name] ZOX:[id number] DOB: [birth year] DOA: 04/08/2012 PCP: No primary provider on file.  Assessment/Plan:   1).Unresponsivenes -seizure vs CVA - stat CT today of head again negative -Given IV Ativan, and EEG ordered  -Neurology consulted, discussed patient with a Dr. Roseanne Reno who recommends Keppra IV 1000mg  x1 and he'll see patient -follow and transfer to step down if not improving. -Patient also vomiting, will obtain abdominal x-ray, hold off tube feedings and follow   2) Metabolic Encephalopathy in patient with MR - - Most likely multifactorial given Pneumonia and per initial reports metabolic acidosis and possible bacteremia - Initial CT of head interpreted as no acute intracranial abnormality - pt had improved clinically and appeared to be at baseline on 3/9- as per famiily at bedside, but on followup today she was unresponsive as above- please see work up as above  3) HAP - flu negative - Urine legionella neg  - step pneumonia Ag neg - was initially placed on broad spectrum antibiotics - initial chest x ray interpreted as Developing opacity at the left lung base concerning for pneumonia - she subsequently improved clinically and her antibiotics were deescalated to levaquin  And she remaining afebrie and hemodynamically stable  4) Positive blood culture - could likely be contaminant.  Subsequent blood cultures have been negative.  I suspect that initial 1 positive blood culture was a contaminant. - patient is non toxic appearing - Had been Discussed with ID per rounding hospitalist and they agree that initial positive blood culture was likely contaminant.  5) Hypokalemia - replace k    5) Anxiety and depression - continue home regimen paxil  6) HTN, UNCONTROLLED -On norvasc, increase metoprolol added 3/8  - continue hydralazine prn - continue to monitor bp's  7) DM II -better BG control on current regimen, will continue  and monitor  8) Severe protein calorie malnutrition with albumin 1.9 - continue current tube feeds as listed below. 9.Fever -follow up on blood and urine culture -CXR this am neg as above -continue abx broadened to vanc and zosyn(from Levaquin) last PM pending cultures   Access: Picc line  IVF: OFF  Proph: lovenox   Code Status: full  Family Communication: spoke with patient and her husband  Disposition Plan:   Appreciate PT/OT/Nutrition assistance. Likely D/c to home with RN, SW, and aid in am.    Consultants:  Dietitian  Procedures:  none  Antibiotics:  Please see above  HPI/Subjective: Per nursing pt has been unresponsive and vomiting up TF this am.  Objective: Filed Vitals:   04/17/12 0000 04/17/12 0200 04/17/12 0400 04/17/12 0600  BP: 127/60 125/52 124/48 129/50  Pulse: 107 102 95 96  Temp: 101.3 F (38.5 C) 100.6 F (38.1 C) 99.1 F (37.3 C) 99 F (37.2 C)  TempSrc: Core (Comment)     Resp: 19 37 29 18  Height:      Weight:   74.8 kg (164 lb 14.5 oz)   SpO2: 96% 95% 96% 93%    Intake/Output Summary (Last 24 hours) at 04/17/12 0842 Last data filed at 04/17/12 0800  Gross per 24 hour  Intake   3980 ml  Output    405 ml  Net   3575 ml   Filed Weights   04/08/12 1300 04/16/12 1745 04/17/12 0400  Weight: 78.3 kg (172 lb 9.9 oz) 78.3 kg (172 lb 9.9 oz) 74.8 kg (164 lb 14.5 oz)    Exam:   General:  Unresponsive, to  voice and touch, in no resp distress  HEENT: L.gaze preference, twitching/shaking on L. Side of face and L.upper extremity   Cardiovascular: RRR, no MRG  Respiratory: decreased breath sounds at bases, no wheezes no increased work of breathing  Abdomen: soft, NT, ND  Musculoskeletal: no cyanosis   Data Reviewed: Basic Metabolic Panel:  Recent Labs Lab 04/13/12 0520 04/14/12 0445 04/15/12 0415 04/16/12 0630 04/16/12 0905 04/17/12 0350  NA 137 135 135 141 136 136  K 3.7 3.7 3.6 3.3* 3.7 3.5  CL 100 98 99 102 97 101   CO2 26 24 27 25 24 25   GLUCOSE 173* 248* 190* 82 174* 130*  BUN 13 11 11 9 9 12   CREATININE 0.43* 0.38* 0.38*  0.38* 0.38* 0.38* 0.50  CALCIUM 9.6 9.7 9.5 9.2 9.1 8.5  MG 1.8 1.6 1.7 1.6  --  1.7  PHOS 2.7 2.8 2.8 2.7  --  3.0   Liver Function Tests:  Recent Labs Lab 04/13/12 0520 04/14/12 0445 04/15/12 0415 04/16/12 0630 04/17/12 0350  ALBUMIN 2.9* 3.0* 3.1* 3.1* 2.7*  2.7*   No results found for this basename: LIPASE, AMYLASE,  in the last 168 hours No results found for this basename: AMMONIA,  in the last 168 hours CBC:  Recent Labs Lab 04/13/12 0520 04/14/12 0445 04/15/12 0415 04/16/12 0630 04/17/12 0350  WBC 7.6 9.2 8.2 8.9 9.1  HGB 10.8* 11.5* 10.8* 11.3* 9.8*  HCT 33.8* 35.7* 33.9* 35.6* 30.3*  MCV 89.7 89.7 89.7 89.4 88.9  PLT 303 328 351 409* 334   Cardiac Enzymes:  Recent Labs Lab 04/16/12 0905 04/16/12 1500 04/16/12 2200  TROPONINI <0.30 <0.30 <0.30   BNP (last 3 results) No results found for this basename: PROBNP,  in the last 8760 hours CBG:  Recent Labs Lab 04/16/12 1626 04/16/12 2013 04/16/12 2343 04/17/12 0348 04/17/12 0753  GLUCAP 176* 177* 170* 118* 96    Recent Results (from the past 240 hour(s))  CULTURE, BLOOD (ROUTINE X 2)     Status: None   Collection Time    04/08/12  8:30 AM      Result Value Range Status   Specimen Description BLOOD RIGHT ANTECUBITAL   Final   Special Requests BOTTLES DRAWN AEROBIC AND ANAEROBIC 4 CC EA   Final   Culture  Setup Time 04/08/2012 15:22   Final   Culture NO GROWTH 5 DAYS   Final   Report Status 04/14/2012 FINAL   Final  URINE CULTURE     Status: None   Collection Time    04/08/12  8:52 AM      Result Value Range Status   Specimen Description URINE, CATHETERIZED   Final   Special Requests NONE   Final   Culture  Setup Time 04/08/2012 16:32   Final   Colony Count NO GROWTH   Final   Culture NO GROWTH   Final   Report Status 04/09/2012 FINAL   Final  CULTURE, BLOOD (ROUTINE X 2)      Status: None   Collection Time    04/08/12  9:11 AM      Result Value Range Status   Specimen Description BLOOD RIGHT HAND  1 ML IN [facility] BOTTLE   Final   Special Requests NONE   Final   Culture  Setup Time 04/08/2012 15:22   Final   Culture     Final   Value: STAPHYLOCOCCUS SPECIES (COAGULASE NEGATIVE)     Note: THE SIGNIFICANCE OF ISOLATING THIS ORGANISM  FROM A SINGLE SET OF BLOOD CULTURES WHEN MULTIPLE SETS ARE DRAWN IS UNCERTAIN. PLEASE NOTIFY THE MICROBIOLOGY DEPARTMENT WITHIN ONE WEEK IF SPECIATION AND SENSITIVITIES ARE REQUIRED.     Note: Gram Stain Report Called to,Read Back By and Verified With: KRISTY SYKES ON 04/09/2012 AT 9:33P BY WILEJ   Report Status 04/12/2012 FINAL   Final  MRSA PCR SCREENING     Status: None   Collection Time    04/08/12 11:43 AM      Result Value Range Status   MRSA by PCR NEGATIVE  NEGATIVE Final   Comment:            The GeneXpert MRSA Assay (FDA     approved for NASAL specimens     only), is one component of a     comprehensive MRSA colonization     surveillance program. It is not     intended to diagnose MRSA     infection nor to guide or     monitor treatment for     MRSA infections.  CULTURE, BLOOD (ROUTINE X 2)     Status: None   Collection Time    04/10/12  8:10 AM      Result Value Range Status   Specimen Description BLOOD LEFT ARM   Final   Special Requests BOTTLES DRAWN AEROBIC AND ANAEROBIC 5CC   Final   Culture  Setup Time 04/10/2012 13:46   Final   Culture NO GROWTH 5 DAYS   Final   Report Status 04/16/2012 FINAL   Final  CULTURE, BLOOD (ROUTINE X 2)     Status: None   Collection Time    04/10/12  8:20 AM      Result Value Range Status   Specimen Description BLOOD LEFT ARM   Final   Special Requests BOTTLES DRAWN AEROBIC ONLY 1CC   Final   Culture  Setup Time 04/10/2012 13:46   Final   Culture NO GROWTH 5 DAYS   Final   Report Status 04/16/2012 FINAL   Final     Studies: Ct Head Wo Contrast  04/16/2012  *RADIOLOGY REPORT*   Clinical Data: Altered mental status, cerebral palsy  CT HEAD WITHOUT CONTRAST  Technique:  Contiguous axial images were obtained from the base of the skull through the vertex without contrast.  Comparison: 04/08/2012  Findings: Stable postop changes in the anterior right frontal lobe. Diffuse brain atrophy noted with ventricular enlargement as before. No significant interval change.  No acute intracranial hemorrhage, definite mass lesion, acute infarction, focal edema, mass effect, midline shift, or extra-axial fluid collection.  Cisterns patent. Cerebellar atrophy as well.  Exam is limited because of oblique acquisition.  Chronic periventricular white matter microvascular ischemic changes noted diffusely.  Basal ganglia lacunar infarcts noted bilaterally.  IMPRESSION: Stable chronic atrophy and microvascular ischemic changes as well as postoperative findings.  No acute process by noncontrast CT or significant interval change.   Original Report Authenticated By: Judie Petit. Miles Costain, M.D.    Dg Chest Port 1 View  04/17/2012  *RADIOLOGY REPORT*  Clinical Data: Weakness, shortness of breath  PORTABLE CHEST - 1 VIEW  Comparison: Portable chest x-ray of 04/11/2012  Findings: There is still slight elevation of the right hemidiaphragm.  No infiltrate or effusion is seen.  The right PICC line is unchanged in position, with the tip overlying the mid upper SVC.  Heart size is stable.  IMPRESSION: No active infiltrate or effusion.  No change in right PICC line position.  Original Report Authenticated By: Dwyane Dee, M.D.    Dg Abd Portable 1v  04/16/2012  *RADIOLOGY REPORT*  Clinical Data: Vomiting.  PORTABLE ABDOMEN - 1 VIEW  Comparison: Abdominal radiographs dated 03/22/2012 and CT scan dated 03/03/2012  Findings: There is minimal air in the bowel.  No dilated loops of bowel.  No visible free air on this supine radiograph.  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  IMPRESSION: Benign-appearing abdomen.   Original Report  Authenticated By: Francene Boyers, M.D.     Scheduled Meds: . amLODipine  5 mg Per Tube Daily  . docusate  100 mg Per Tube BID  . enoxaparin (LOVENOX) injection  40 mg Subcutaneous Q24H  . fenofibrate  160 mg Oral Daily  . ferrous sulfate  300 mg Per Tube Q breakfast  . folic acid  1 mg Per Tube Daily  . hydrocortisone cream   Topical TID  . insulin aspart  0-15 Units Subcutaneous Q4H  . levETIRAcetam  1,000 mg Intravenous Q12H  . metoprolol tartrate  25 mg Per Tube BID  . multivitamin with minerals  1 tablet Per Tube Daily  . PARoxetine  40 mg Per Tube q morning - 10a  . piperacillin-tazobactam (ZOSYN)  IV  3.375 g Intravenous Q8H  . ranitidine  150 mg Per Tube Daily  . thiamine  100 mg Per Tube Daily   Or  . thiamine  100 mg Intravenous Daily  . vancomycin  750 mg Intravenous Q12H   Continuous Infusions: . sodium chloride      Active Problems:   Constipation   Fever   Anxiety and depression   Dyslipidemia   Diabetes mellitus   Sinus tachycardia   HCAP (healthcare-associated pneumonia)   Normocytic anemia   Hypokalemia   Metabolic acidosis, normal anion gap (NAG)   Hypocalcemia   Hypomagnesemia   Protein-calorie malnutrition, severe   Encephalopathy, metabolic   Elevated blood pressure   HTN (hypertension)    Time spent: > 35 minutes    VIYUOH,ADELINE C  [facility] Pager [phone] If 7PM-7AM, please contact night-coverage at [email or web address], password [facility] 04/17/2012, 8:42 AM  LOS: 9 days

## 2012-04-18 DIAGNOSIS — R569 Unspecified convulsions: Secondary | ICD-10-CM

## 2012-04-18 LAB — CBC
HCT: 32.3 % — ABNORMAL LOW (ref 36.0–46.0)
Hemoglobin: 10.3 g/dL — ABNORMAL LOW (ref 12.0–15.0)
MCH: 28.7 pg (ref 26.0–34.0)
MCHC: 31.9 g/dL (ref 30.0–36.0)
MCV: 90 fL (ref 78.0–100.0)

## 2012-04-18 LAB — GLUCOSE, CAPILLARY
Glucose-Capillary: 144 mg/dL — ABNORMAL HIGH (ref 70–99)
Glucose-Capillary: 151 mg/dL — ABNORMAL HIGH (ref 70–99)
Glucose-Capillary: 174 mg/dL — ABNORMAL HIGH (ref 70–99)
Glucose-Capillary: 121 mg/dL — ABNORMAL HIGH (ref 70–99)
Glucose-Capillary: 209 mg/dL — ABNORMAL HIGH (ref 70–99)

## 2012-04-18 LAB — RENAL FUNCTION PANEL
BUN: 9 mg/dL (ref 6–23)
CO2: 23 mEq/L (ref 19–32)
Calcium: 8.5 mg/dL (ref 8.4–10.5)
Creatinine, Ser: 0.42 mg/dL — ABNORMAL LOW (ref 0.50–1.10)
Glucose, Bld: 163 mg/dL — ABNORMAL HIGH (ref 70–99)

## 2012-04-18 LAB — POTASSIUM: Potassium: 3.9 mEq/L (ref 3.5–5.1)

## 2012-04-18 MED ORDER — POTASSIUM CHLORIDE 10 MEQ/100ML IV SOLN
10.0000 meq | INTRAVENOUS | Status: AC
  Administered 2012-04-18 (×4): 10 meq via INTRAVENOUS
  Filled 2012-04-18 (×3): qty 100

## 2012-04-18 MED ORDER — MAGNESIUM SULFATE IN D5W 10-5 MG/ML-% IV SOLN
1.0000 g | Freq: Once | INTRAVENOUS | Status: AC
  Administered 2012-04-18: 1 g via INTRAVENOUS
  Filled 2012-04-18: qty 100

## 2012-04-18 MED ORDER — POTASSIUM CHLORIDE 20 MEQ/15ML (10%) PO LIQD
20.0000 meq | Freq: Once | ORAL | Status: AC
  Administered 2012-04-18: 20 meq
  Filled 2012-04-18: qty 15

## 2012-04-18 NOTE — Progress Notes (Signed)
Subjective: Patient awake.  No evidence of further seizure activity.  On Keppra and Dilantin.  Dilantin level on 3/11 corrected to therapeutic.    Objective: Current vital signs: BP 166/56  Pulse 81  Temp(Src) 98.1 F (36.7 C) (Core (Comment))  Resp 15  Ht 5\' 3"  (1.6 m)  Wt 75.7 kg (166 lb 14.2 oz)  BMI 29.57 kg/m2  SpO2 98% Vital signs in last 24 hours: Temp:  [98.1 F (36.7 C)-98.6 F (37 C)] 98.1 F (36.7 C) (03/12 0400) Pulse Rate:  [81-97] 81 (03/12 1300) Resp:  [10-22] 15 (03/12 1300) BP: (142-180)/(49-77) 166/56 mmHg (03/12 1300) SpO2:  [93 %-99 %] 98 % (03/12 1300) Weight:  [75.7 kg (166 lb 14.2 oz)] 75.7 kg (166 lb 14.2 oz) (03/12 0300)  Intake/Output from previous day: 03/11 0701 - 03/12 0700 In: 3887.5 [I.V.:1900; IV Piggyback:657.5] Out: 2300 [Urine:2300] Intake/Output this shift: Total I/O In: 1865 [I.V.:450; Other:1015; IV Piggyback:400] Out: 330 [Urine:330] Nutritional status: NPO  Neurologic Exam: Mental Status: Alert.  Speech fluent but dysarthric.  Follows simple commands.   Cranial Nerves: II: Blinks to bilateral confrontation.   III,IV, VI: extra-ocular motions grossly intact  Motor: Postural tremor noted in both upper extremities.   Deep Tendon Reflexes: 2+ in the upper extremities and absent in the lower extremities.   Plantars: Right: mute   Left: mute  Lab Results: Basic Metabolic Panel:  Recent Labs Lab 04/14/12 0445 04/15/12 0415 04/16/12 0630 04/16/12 0905 04/17/12 0350 04/18/12 0430  NA 135 135 141 136 136 138  K 3.7 3.6 3.3* 3.7 3.5 3.0*  CL 98 99 102 97 101 103  CO2 24 27 25 24 25 23   GLUCOSE 248* 190* 82 174* 130* 163*  BUN 11 11 9 9 12 9   CREATININE 0.38* 0.38*  0.38* 0.38* 0.38* 0.50 0.42*  CALCIUM 9.7 9.5 9.2 9.1 8.5 8.5  MG 1.6 1.7 1.6  --  1.7 1.7  PHOS 2.8 2.8 2.7  --  3.0 2.8    Liver Function Tests:  Recent Labs Lab 04/14/12 0445 04/15/12 0415 04/16/12 0630 04/17/12 0350 04/18/12 0430  ALBUMIN 3.0*  3.1* 3.1* 2.7*  2.7* 2.6*   No results found for this basename: LIPASE, AMYLASE,  in the last 168 hours No results found for this basename: AMMONIA,  in the last 168 hours  CBC:  Recent Labs Lab 04/14/12 0445 04/15/12 0415 04/16/12 0630 04/17/12 0350 04/18/12 0430  WBC 9.2 8.2 8.9 9.1 8.2  HGB 11.5* 10.8* 11.3* 9.8* 10.3*  HCT 35.7* 33.9* 35.6* 30.3* 32.3*  MCV 89.7 89.7 89.4 88.9 90.0  PLT 328 351 409* 334 321    Cardiac Enzymes:  Recent Labs Lab 04/16/12 0905 04/16/12 1500 04/16/12 2200  TROPONINI <0.30 <0.30 <0.30    Lipid Panel: No results found for this basename: CHOL, TRIG, HDL, CHOLHDL, VLDL, LDLCALC,  in the last 168 hours  CBG:  Recent Labs Lab 04/17/12 2058 04/18/12 0010 04/18/12 0328 04/18/12 0807 04/18/12 1143  GLUCAP 166* 144* 151* 174* 251*    Microbiology: Results for orders placed during the hospital encounter of 04/08/12  CULTURE, BLOOD (ROUTINE X 2)     Status: None   Collection Time    04/08/12  8:30 AM      Result Value Range Status   Specimen Description BLOOD RIGHT ANTECUBITAL   Final   Special Requests BOTTLES DRAWN AEROBIC AND ANAEROBIC 4 CC EA   Final   Culture  Setup Time 04/08/2012 15:22   Final  Culture NO GROWTH 5 DAYS   Final   Report Status 04/14/2012 FINAL   Final  URINE CULTURE     Status: None   Collection Time    04/08/12  8:52 AM      Result Value Range Status   Specimen Description URINE, CATHETERIZED   Final   Special Requests NONE   Final   Culture  Setup Time 04/08/2012 16:32   Final   Colony Count NO GROWTH   Final   Culture NO GROWTH   Final   Report Status 04/09/2012 FINAL   Final  CULTURE, BLOOD (ROUTINE X 2)     Status: None   Collection Time    04/08/12  9:11 AM      Result Value Range Status   Specimen Description BLOOD RIGHT HAND  1 ML IN [facility] BOTTLE   Final   Special Requests NONE   Final   Culture  Setup Time 04/08/2012 15:22   Final   Culture     Final   Value: STAPHYLOCOCCUS SPECIES  (COAGULASE NEGATIVE)     Note: THE SIGNIFICANCE OF ISOLATING THIS ORGANISM FROM A SINGLE SET OF BLOOD CULTURES WHEN MULTIPLE SETS ARE DRAWN IS UNCERTAIN. PLEASE NOTIFY THE MICROBIOLOGY DEPARTMENT WITHIN ONE WEEK IF SPECIATION AND SENSITIVITIES ARE REQUIRED.     Note: Gram Stain Report Called to,Read Back By and Verified With: KRISTY SYKES ON 04/09/2012 AT 9:33P BY WILEJ   Report Status 04/12/2012 FINAL   Final  MRSA PCR SCREENING     Status: None   Collection Time    04/08/12 11:43 AM      Result Value Range Status   MRSA by PCR NEGATIVE  NEGATIVE Final   Comment:            The GeneXpert MRSA Assay (FDA     approved for NASAL specimens     only), is one component of a     comprehensive MRSA colonization     surveillance program. It is not     intended to diagnose MRSA     infection nor to guide or     monitor treatment for     MRSA infections.  CULTURE, BLOOD (ROUTINE X 2)     Status: None   Collection Time    04/10/12  8:10 AM      Result Value Range Status   Specimen Description BLOOD LEFT ARM   Final   Special Requests BOTTLES DRAWN AEROBIC AND ANAEROBIC 5CC   Final   Culture  Setup Time 04/10/2012 13:46   Final   Culture NO GROWTH 5 DAYS   Final   Report Status 04/16/2012 FINAL   Final  CULTURE, BLOOD (ROUTINE X 2)     Status: None   Collection Time    04/10/12  8:20 AM      Result Value Range Status   Specimen Description BLOOD LEFT ARM   Final   Special Requests BOTTLES DRAWN AEROBIC ONLY 1CC   Final   Culture  Setup Time 04/10/2012 13:46   Final   Culture NO GROWTH 5 DAYS   Final   Report Status 04/16/2012 FINAL   Final  URINE CULTURE     Status: None   Collection Time    04/16/12  7:21 PM      Result Value Range Status   Specimen Description URINE, CATHETERIZED   Final   Special Requests levaquin (d/c'ed) Normal   Final   Culture  Setup Time 04/17/2012 03:02  Final   Colony Count NO GROWTH   Final   Culture NO GROWTH   Final   Report Status 04/17/2012 FINAL    Final  CULTURE, BLOOD (ROUTINE X 2)     Status: None   Collection Time    04/16/12 10:00 PM      Result Value Range Status   Specimen Description BLOOD LEFT ARM   Final   Special Requests BOTTLES DRAWN AEROBIC AND ANAEROBIC   Final   Culture  Setup Time 04/17/2012 02:54   Final   Culture     Final   Value:        BLOOD CULTURE RECEIVED NO GROWTH TO DATE CULTURE WILL BE HELD FOR 5 DAYS BEFORE ISSUING A FINAL NEGATIVE REPORT   Report Status PENDING   Incomplete  CULTURE, BLOOD (ROUTINE X 2)     Status: None   Collection Time    04/16/12 10:19 PM      Result Value Range Status   Specimen Description BLOOD LEFT   Final   Special Requests BOTTLES DRAWN AEROBIC AND ANAEROBIC   Final   Culture  Setup Time 04/17/2012 02:54   Final   Culture     Final   Value:        BLOOD CULTURE RECEIVED NO GROWTH TO DATE CULTURE WILL BE HELD FOR 5 DAYS BEFORE ISSUING A FINAL NEGATIVE REPORT   Report Status PENDING   Incomplete    Coagulation Studies: No results found for this basename: LABPROT, INR,  in the last 72 hours  Imaging: Dg Chest Port 1 View  04/17/2012  *RADIOLOGY REPORT*  Clinical Data: Weakness, shortness of breath  PORTABLE CHEST - 1 VIEW  Comparison: Portable chest x-ray of 04/11/2012  Findings: There is still slight elevation of the right hemidiaphragm.  No infiltrate or effusion is seen.  The right PICC line is unchanged in position, with the tip overlying the mid upper SVC.  Heart size is stable.  IMPRESSION: No active infiltrate or effusion.  No change in right PICC line position.   Original Report Authenticated By: Dwyane Dee, M.D.     Medications:  I have reviewed the patient's current medications. Scheduled: . amLODipine  5 mg Per Tube Daily  . antiseptic oral rinse  15 mL Mouth Rinse q12n4p  . chlorhexidine  15 mL Mouth Rinse BID  . docusate  100 mg Per Tube BID  . enoxaparin (LOVENOX) injection  40 mg Subcutaneous Q24H  . fenofibrate  160 mg Oral Daily  . ferrous  sulfate  300 mg Per Tube Q breakfast  . folic acid  1 mg Per Tube Daily  . free water  200 mL Per Tube Q4H  . hydrocortisone cream   Topical TID  . insulin aspart  0-15 Units Subcutaneous Q4H  . levETIRAcetam  1,000 mg Intravenous Q12H  . metoprolol tartrate  25 mg Per Tube BID  . multivitamin with minerals  1 tablet Per Tube Daily  . PARoxetine  40 mg Per Tube q morning - 10a  . phenytoin (DILANTIN) IV  300 mg Intravenous Q1500  . piperacillin-tazobactam (ZOSYN)  IV  3.375 g Intravenous Q8H  . ranitidine  150 mg Per Tube Daily  . thiamine  100 mg Per Tube Daily   Or  . thiamine  100 mg Intravenous Daily  . vancomycin  750 mg Intravenous Q12H    Assessment/Plan: Seizure   Assessment: No further seizures noted.  With history of surgery and recent  infection to decrease seizure threshold this has likely led to the recent breakthrough.  Patient on Keppra and Dilantin.  Seems to be tolerating well.  EEG slow.   Plan:  Continue Keppra and Dilantin at current doses.      LOS: 10 days   Thana Farr, MD [facility] [phone] 04/18/2012  3:49 PM

## 2012-04-18 NOTE — Progress Notes (Signed)
[facility]                                                                                Patient Demographics  [name], is a 72 y.o. female, DOB - 01-10-1941, ZOX:[id number], WJX:[id number]  Admit date - 04/08/2012  Admitting Physician Renae Fickle, MD  Outpatient Primary MD for the patient is No primary provider on file.  LOS - 10   No chief complaint on file.       Assessment & Plan    Assumed care of the patient 04/18/2012 on the day 10 of her hospital stay.    Brief summary   This is a 72 year old female with history of cerebral palsy, mental titration, not in system, diabetes mellitus type 2, renal cancer, dysphagia with history of recurrent aspiration pneumonia status post PEG tube, who was admitted on 04-2012 with altered mental status, bladder spasms and fever. Patient's primary caretaker is her husband and the history was provided by him upon admission. Upon admission patient had evidence of left lower lobe pneumonia, elevated blood sugars, hypokalemia, all contemplating to metabolic encephalopathy with decreased mental status, she was seen by neurology, her mental status declined on 04/16/2012 which prompted a neurology consult, differential at that time was seizure activity versus CVA. CT of the head was negative.  Patient also had a false positive blood culture with coag-negative staph, she continues to be on broad-spectrum antibiotics for healthcare associated pneumonia, currently clinically improving.    1) Metabolic Encephalopathy in patient with MR - with questionable underlying seizure activity or CVA on 04/16/2012 causing worsening of mental status. Which has started to improve on 04/18/2012 - Most likely multifactorial given Pneumonia and per initial reports metabolic acidosis, EEG consistent with metabolic encephalopathy, CT head negative. -Continue seizure medications per neurology, along with supportive care, some improvement in  mentation noted on 04/18/2012. - Initial CT of head interpreted as no acute intracranial abnormality      2) HAP  - flu negative  - Urine legionella neg  - step pneumonia Ag neg  - was replaced on broad spectrum antibiotics on 04/16/2012 when she spiked a low-grade fever and mental status declined again. -If she continues to improve clinically and x-ray remained stable will DC broad-spectrum antibiotics quickly.    3) Positive blood culture  - could likely be contaminant. Subsequent blood cultures have been negative. I suspect that initial 1 positive blood culture was a contaminant.  - patient is non toxic appearing , Had been Discussed with ID per rounding hospitalist and they agree that initial positive blood culture was likely contaminant.     4) Hypokalemia  - replace k     6) Anxiety and depression  - continue home regimen paxil     7) HTN, UNCONTROLLED  -On norvasc, increase metoprolol added 3/8  - continue hydralazine prn  - continue to monitor bp's     8) DM II  -better BG control on current regimen, will continue and monitor   CBG (last 3)   Recent Labs  04/18/12 0010 04/18/12 0328 04/18/12 0807  GLUCAP 144* 151* 174*    Lab Results  Component Value Date   HGBA1C  8.7* 03/02/2012     9) Severe protein calorie malnutrition with albumin 1.9  - continue current tube feeds as listed below.     10) Fever on 04/16/2012 causing worsening of metabolic encephalopathy - etiology unclear could be due to her initial left lower lobe pneumonia. -Repeat blood cultures negative, UA unremarkable, portable chest x-ray stable with no further worsening, much better on broad-spectrum antibiotics, we will continue to monitor closely.     Code Status: Full  Family Communication:   Disposition Plan: SNF   Procedures CT brain, EEG, PICC   Consults  Neurology   DVT Prophylaxis  Lovenox    Lab Results  Component Value Date   PLT 321 04/18/2012     Medications  Scheduled Meds: . amLODipine  5 mg Per Tube Daily  . antiseptic oral rinse  15 mL Mouth Rinse q12n4p  . chlorhexidine  15 mL Mouth Rinse BID  . docusate  100 mg Per Tube BID  . enoxaparin (LOVENOX) injection  40 mg Subcutaneous Q24H  . fenofibrate  160 mg Oral Daily  . ferrous sulfate  300 mg Per Tube Q breakfast  . folic acid  1 mg Per Tube Daily  . free water  200 mL Per Tube Q4H  . hydrocortisone cream   Topical TID  . insulin aspart  0-15 Units Subcutaneous Q4H  . levETIRAcetam  1,000 mg Intravenous Q12H  . metoprolol tartrate  25 mg Per Tube BID  . multivitamin with minerals  1 tablet Per Tube Daily  . PARoxetine  40 mg Per Tube q morning - 10a  . phenytoin (DILANTIN) IV  300 mg Intravenous Q1500  . piperacillin-tazobactam (ZOSYN)  IV  3.375 g Intravenous Q8H  . potassium chloride  10 mEq Intravenous Q1 Hr x 4  . potassium chloride  20 mEq Per Tube Once  . ranitidine  150 mg Per Tube Daily  . thiamine  100 mg Per Tube Daily   Or  . thiamine  100 mg Intravenous Daily  . vancomycin  750 mg Intravenous Q12H   Continuous Infusions: . sodium chloride 1,000 mL (04/18/12 0912)  . feeding supplement (JEVITY 1.2 CAL) 1,000 mL (04/18/12 0400)   PRN Meds:.hydrALAZINE, levalbuterol, ondansetron, polyethylene glycol, sennosides, sodium chloride  Antibiotics    Anti-infectives   Start     Dose/Rate Route Frequency Ordered Stop   04/16/12 2000  piperacillin-tazobactam (ZOSYN) IVPB 3.375 g     3.375 g 12.5 mL/hr over 240 Minutes Intravenous Every 8 hours 04/16/12 1854     04/16/12 2000  vancomycin (VANCOCIN) 750 mg in sodium chloride 0.9 % 150 mL IVPB     750 mg 150 mL/hr over 60 Minutes Intravenous Every 12 hours 04/16/12 1915     04/14/12 1200  levofloxacin (LEVAQUIN) tablet 750 mg  Status:  Discontinued     750 mg Per Tube Daily 04/14/12 1129 04/16/12 1830   04/11/12 1443  ceFEPIme (MAXIPIME) 1 g in dextrose 5 % 50 mL IVPB  Status:  Discontinued     1 g 100  mL/hr over 30 Minutes Intravenous 3 times per day 04/11/12 1443 04/14/12 0929   04/10/12 1600  vancomycin (VANCOCIN) 750 mg in sodium chloride 0.9 % 150 mL IVPB  Status:  Discontinued     750 mg 150 mL/hr over 60 Minutes Intravenous Every 12 hours 04/10/12 1525 04/14/12 0929   04/08/12 2200  vancomycin (VANCOCIN) IVPB 1000 mg/200 mL premix  Status:  Discontinued     1,000 mg 200  mL/hr over 60 Minutes Intravenous Every 12 hours 04/08/12 1217 04/10/12 0953   04/08/12 1400  ceFEPIme (MAXIPIME) 1 g in dextrose 5 % 50 mL IVPB  Status:  Discontinued     1 g 100 mL/hr over 30 Minutes Intravenous 3 times per day 04/08/12 1202 04/11/12 1443   04/08/12 1300  levofloxacin (LEVAQUIN) IVPB 750 mg     750 mg 100 mL/hr over 90 Minutes Intravenous Every 24 hours 04/08/12 1202 04/10/12 1413   04/08/12 0900  vancomycin (VANCOCIN) IVPB 1000 mg/200 mL premix     1,000 mg 200 mL/hr over 60 Minutes Intravenous To Emergency Dept 04/08/12 0808 04/08/12 1038   04/08/12 0900  piperacillin-tazobactam (ZOSYN) IVPB 3.375 g     3.375 g 100 mL/hr over 30 Minutes Intravenous To Emergency Dept 04/08/12 0808 04/08/12 1012       Time Spent in minutes  35   Susa Raring K M.D on 04/18/2012 at 10:16 AM  Between 7am to 7pm - Pager - [phone]  After 7pm go to [email or web address] - password [email or web address]  And look for the night coverage person covering for me after hours  [facility] Office  [phone]    Subjective:   Shanecia Hoganson today is in bed, unreliable historian, answers a few questions, no distress noted.Denies headache-chest-abd pain.  Objective:   Filed Vitals:   04/18/12 0300 04/18/12 0400 04/18/12 0500 04/18/12 0700  BP: 149/51  164/59 162/52  Pulse: 84  89 88  Temp:  98.1 F (36.7 C)    TempSrc:  Core (Comment)    Resp: 19  22 17   Height:      Weight: 75.7 kg (166 lb 14.2 oz)     SpO2: 94%  96% 96%    Wt Readings from Last 3 Encounters:  04/18/12 75.7 kg (166 lb 14.2 oz)   03/05/12 78.336 kg (172 lb 11.2 oz)     Intake/Output Summary (Last 24 hours) at 04/18/12 1016 Last data filed at 04/18/12 0600  Gross per 24 hour  Intake 3197.5 ml  Output   1980 ml  Net 1217.5 ml    Exam Awake answers a few questions .AT,PERRAL Supple Neck,No JVD, No cervical lymphadenopathy appriciated.  Symmetrical Chest wall movement, Good air movement bilaterally, CTAB RRR,No Gallops,Rubs or new Murmurs, No Parasternal Heave +ve B.Sounds, Abd Soft, Non tender, No organomegaly appriciated, No rebound - guarding or rigidity. PEG tube in place No Cyanosis, Clubbing or edema, No new Rash or bruise     Data Review   Micro Results Recent Results (from the past 240 hour(s))  MRSA PCR SCREENING     Status: None   Collection Time    04/08/12 11:43 AM      Result Value Range Status   MRSA by PCR NEGATIVE  NEGATIVE Final   Comment:            The GeneXpert MRSA Assay (FDA     approved for NASAL specimens     only), is one component of a     comprehensive MRSA colonization     surveillance program. It is not     intended to diagnose MRSA     infection nor to guide or     monitor treatment for     MRSA infections.  CULTURE, BLOOD (ROUTINE X 2)     Status: None   Collection Time    04/10/12  8:10 AM      Result Value Range Status   Specimen Description BLOOD LEFT  ARM   Final   Special Requests BOTTLES DRAWN AEROBIC AND ANAEROBIC 5CC   Final   Culture  Setup Time 04/10/2012 13:46   Final   Culture NO GROWTH 5 DAYS   Final   Report Status 04/16/2012 FINAL   Final  CULTURE, BLOOD (ROUTINE X 2)     Status: None   Collection Time    04/10/12  8:20 AM      Result Value Range Status   Specimen Description BLOOD LEFT ARM   Final   Special Requests BOTTLES DRAWN AEROBIC ONLY 1CC   Final   Culture  Setup Time 04/10/2012 13:46   Final   Culture NO GROWTH 5 DAYS   Final   Report Status 04/16/2012 FINAL   Final  URINE CULTURE     Status: None   Collection Time    04/16/12   7:21 PM      Result Value Range Status   Specimen Description URINE, CATHETERIZED   Final   Special Requests levaquin (d/c'ed) Normal   Final   Culture  Setup Time 04/17/2012 03:02   Final   Colony Count NO GROWTH   Final   Culture NO GROWTH   Final   Report Status 04/17/2012 FINAL   Final  CULTURE, BLOOD (ROUTINE X 2)     Status: None   Collection Time    04/16/12 10:00 PM      Result Value Range Status   Specimen Description BLOOD LEFT ARM   Final   Special Requests BOTTLES DRAWN AEROBIC AND ANAEROBIC   Final   Culture  Setup Time 04/17/2012 02:54   Final   Culture     Final   Value:        BLOOD CULTURE RECEIVED NO GROWTH TO DATE CULTURE WILL BE HELD FOR 5 DAYS BEFORE ISSUING A FINAL NEGATIVE REPORT   Report Status PENDING   Incomplete  CULTURE, BLOOD (ROUTINE X 2)     Status: None   Collection Time    04/16/12 10:19 PM      Result Value Range Status   Specimen Description BLOOD LEFT   Final   Special Requests BOTTLES DRAWN AEROBIC AND ANAEROBIC   Final   Culture  Setup Time 04/17/2012 02:54   Final   Culture     Final   Value:        BLOOD CULTURE RECEIVED NO GROWTH TO DATE CULTURE WILL BE HELD FOR 5 DAYS BEFORE ISSUING A FINAL NEGATIVE REPORT   Report Status PENDING   Incomplete    Radiology Reports    Ct Head Wo Contrast  04/08/2012  *RADIOLOGY REPORT*  Clinical Data: Altered mental status.  CT HEAD WITHOUT CONTRAST  Technique:  Contiguous axial images were obtained from the base of the skull through the vertex without contrast.  Comparison: 07/30/2009  Findings: Postsurgical changes in the right frontal lobe.  These are stable since prior study.  Atrophy.  Associated ventriculomegaly, likely related to ex vacuo dilatation.  No acute infarction or hemorrhage.  No extra-axial fluid collection.  No acute calvarial abnormality. Visualized paranasal sinuses and mastoids clear.  Orbital soft tissues unremarkable.  IMPRESSION: No acute intracranial abnormality.  No change  since prior study.   Original Report Authenticated By: Charlett Nose, M.D.    Dg Chest Port 1 View  04/17/2012  *RADIOLOGY REPORT*  Clinical Data: Weakness, shortness of breath  PORTABLE CHEST - 1 VIEW  Comparison: Portable chest x-ray of 04/11/2012  Findings: There  is still slight elevation of the right hemidiaphragm.  No infiltrate or effusion is seen.  The right PICC line is unchanged in position, with the tip overlying the mid upper SVC.  Heart size is stable.  IMPRESSION: No active infiltrate or effusion.  No change in right PICC line position.   Original Report Authenticated By: Dwyane Dee, M.D.      Dg Abd Portable 1v  04/16/2012  *RADIOLOGY REPORT*  Clinical Data: Vomiting.  PORTABLE ABDOMEN - 1 VIEW  Comparison: Abdominal radiographs dated 03/22/2012 and CT scan dated 03/03/2012  Findings: There is minimal air in the bowel.  No dilated loops of bowel.  No visible free air on this supine radiograph.  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  IMPRESSION: Benign-appearing abdomen.   Original Report Authenticated By: Francene Boyers, M.D.     [facility]  Recent Labs Lab 04/14/12 [phone] 04/15/12 0415 04/16/12 0630 04/17/12 0350 04/18/12 0430  WBC 9.2 8.2 8.9 9.1 8.2  HGB 11.5* 10.8* 11.3* 9.8* 10.3*  HCT 35.7* 33.9* 35.6* 30.3* 32.3*  PLT 328 351 409* 334 321  MCV 89.7 89.7 89.4 88.9 90.0  MCH 28.9 28.6 28.4 28.7 28.7  MCHC 32.2 31.9 31.7 32.3 31.9  RDW 15.2 15.2 15.1 15.4 15.1    Chemistries   Recent Labs Lab 04/14/12 0445 04/15/12 0415 04/16/12 0630 04/16/12 0905 04/17/12 0350 04/18/12 0430  NA 135 135 141 136 136 138  K 3.7 3.6 3.3* 3.7 3.5 3.0*  CL 98 99 102 97 101 103  CO2 24 27 25 24 25 23   GLUCOSE 248* 190* 82 174* 130* 163*  BUN 11 11 9 9 12 9   CREATININE 0.38* 0.38*  0.38* 0.38* 0.38* 0.50 0.42*  CALCIUM 9.7 9.5 9.2 9.1 8.5 8.5  MG 1.6 1.7 1.6  --  1.7 1.7    ------------------------------------------------------------------------------------------------------------------ estimated creatinine clearance is 64.6 ml/min (by C-G formula based on Cr of 0.42). ------------------------------------------------------------------------------------------------------------------ No results found for this basename: HGBA1C,  in the last 72 hours ------------------------------------------------------------------------------------------------------------------ No results found for this basename: CHOL, HDL, LDLCALC, TRIG, CHOLHDL, LDLDIRECT,  in the last 72 hours ------------------------------------------------------------------------------------------------------------------ No results found for this basename: TSH, T4TOTAL, FREET3, T3FREE, THYROIDAB,  in the last 72 hours ------------------------------------------------------------------------------------------------------------------ No results found for this basename: VITAMINB12, FOLATE, FERRITIN, TIBC, IRON, RETICCTPCT,  in the last 72 hours  Coagulation profile No results found for this basename: INR, PROTIME,  in the last 168 hours  No results found for this basename: DDIMER,  in the last 72 hours  Cardiac Enzymes  Recent Labs Lab 04/16/12 0905 04/16/12 1500 04/16/12 2200  TROPONINI <0.30 <0.30 <0.30   ------------------------------------------------------------------------------------------------------------------ No components found with this basename: POCBNP,

## 2012-04-18 NOTE — Procedures (Signed)
EEG NUMBER:  [id number].  INDICATION FOR STUDY:  A 72 year old lady with altered mental status, as well as a apparent focal seizure activity on April 16, 2012.  Study is being performed to assess severity of encephalopathy as well as to rule out new onset seizure disorder.  DESCRIPTION:  This is a routine EEG recording performed during wakefulness.  Predominant background activity consisted of irregular 2-6 hertz diffuse symmetrical continuous delta and theta activity.  Photic stimulation produced a symmetrical occipital driving response. Hyperventilation was not performed.  No epileptiform discharges were recorded.  There were no areas of disproportionate focal slowing.  INTERPRETATION:  EEG showed moderately severe generalized nonspecific slowing of cerebral activity.  This pattern of slowing can be seen with degenerative as well as metabolic encephalopathies.  No evidence of an epileptic disorder was demonstrated.     Noel Christmas, MD    AV:[id number] D:  04/17/2012 18:14:33  T:  04/18/2012 04:31:34  Job #:  [id number]

## 2012-04-19 LAB — RENAL FUNCTION PANEL
CO2: 24 mEq/L (ref 19–32)
Chloride: 100 mEq/L (ref 96–112)
Creatinine, Ser: 0.39 mg/dL — ABNORMAL LOW (ref 0.50–1.10)
GFR calc Af Amer: >90 mL/min (ref >90)
GFR calc non Af Amer: >90 mL/min (ref >90)
Glucose, Bld: 238 mg/dL — ABNORMAL HIGH (ref 70–99)

## 2012-04-19 LAB — CBC
MCV: 88.7 fL (ref 78.0–100.0)
Platelets: 350 10*3/uL (ref 150–400)
RBC: 3.82 MIL/uL — ABNORMAL LOW (ref 3.87–5.11)
WBC: 10.4 10*3/uL (ref 4.0–10.5)

## 2012-04-19 LAB — SODIUM, URINE, RANDOM: Sodium, Ur: 122 mEq/L

## 2012-04-19 LAB — GLUCOSE, CAPILLARY: Glucose-Capillary: 212 mg/dL — ABNORMAL HIGH (ref 70–99)

## 2012-04-19 MED ORDER — METOPROLOL TARTRATE 50 MG PO TABS
75.0000 mg | ORAL_TABLET | Freq: Two times a day (BID) | ORAL | Status: DC
  Administered 2012-04-19 – 2012-04-21 (×5): 75 mg
  Filled 2012-04-19 (×7): qty 1

## 2012-04-19 MED ORDER — METOPROLOL TARTRATE 1 MG/ML IV SOLN: 5.0000 mg | INTRAVENOUS | Status: DC | PRN

## 2012-04-19 MED ORDER — CLONAZEPAM 0.5 MG PO TABS
0.5000 mg | ORAL_TABLET | Freq: Two times a day (BID) | ORAL | Status: DC
  Administered 2012-04-19 – 2012-04-21 (×4): 0.5 mg
  Filled 2012-04-19 (×4): qty 1

## 2012-04-19 MED ORDER — POTASSIUM CHLORIDE 20 MEQ/15ML (10%) PO LIQD
40.0000 meq | Freq: Once | ORAL | Status: AC
  Administered 2012-04-19: 40 meq
  Filled 2012-04-19: qty 30

## 2012-04-19 MED ORDER — MAGNESIUM SULFATE 40 MG/ML IJ SOLN
2.0000 g | Freq: Once | INTRAMUSCULAR | Status: AC
  Administered 2012-04-19: 2 g via INTRAVENOUS
  Filled 2012-04-19: qty 50

## 2012-04-19 MED ORDER — INSULIN GLARGINE 100 UNIT/ML ~~LOC~~ SOLN
15.0000 [IU] | Freq: Every day | SUBCUTANEOUS | Status: DC
  Administered 2012-04-19: 15 [IU] via SUBCUTANEOUS

## 2012-04-19 NOTE — Progress Notes (Signed)
[facility]                                                                                Patient Demographics  [name], is a 72 y.o. female, DOB - 1940-10-09, ZOX:[id number], WJX:[id number]  Admit date - 04/08/2012  Admitting Physician Renae Fickle, MD  Outpatient Primary MD for the patient is No primary provider on file.  LOS - 11   No chief complaint on file.       Assessment & Plan    Assumed care of the patient 04/18/2012 on the day 10 of her hospital stay.    Brief summary   This is a 72 year old female with history of cerebral palsy, mental titration, not in system, diabetes mellitus type 2, renal cancer, dysphagia with history of recurrent aspiration pneumonia status post PEG tube, who was admitted on 04-2012 with altered mental status, bladder spasms and fever. Patient's primary caretaker is her husband and the history was provided by him upon admission. Upon admission patient had evidence of left lower lobe pneumonia, elevated blood sugars, hypokalemia, all contemplating to metabolic encephalopathy with decreased mental status, she was seen by neurology, her mental status declined on 04/16/2012 which prompted a neurology consult, differential at that time was seizure activity versus CVA. CT of the head was negative.  Patient also had a false positive blood culture with coag-negative staph, she continues to be on broad-spectrum antibiotics for healthcare associated pneumonia, currently clinically improving.    1) Metabolic Encephalopathy in patient with MR - with breakthrough seizure activity  on 04/16/2012 due to fevers causing worsening of mental status. Which has started to improve on 04/18/2012 - Most likely multifactorial given Pneumonia and per initial reports metabolic acidosis, EEG consistent with metabolic encephalopathy, CT head negative. -Continue seizure medications per neurology, along with supportive care, mentation much better now  likely close to her baseline, we'll transfer her out to step down.     2) HAP  - flu negative  - Urine legionella neg  - step pneumonia Ag neg  - was replaced on broad spectrum antibiotics on 04/16/2012 when she spiked a low-grade fever and mental status declined again. -If she continues to improve clinically and x-ray remained stable will DC broad-spectrum antibiotics 04/20/2012 and transition to oral.    3) Positive blood culture  - could likely be contaminant. Subsequent blood cultures have been negative. I suspect that initial 1 positive blood culture was a contaminant.  - patient is non toxic appearing , Had been Discussed with ID per rounding hospitalist and they agree that initial positive blood culture was likely contaminant.     4) Hypokalemia  - replaced k stable, will monitor potassium and magnesium closely.    6) Anxiety and depression  - continue home regimen paxil     7) HTN, UNCONTROLLED  -On norvasc, increase metoprolol added 3/8  - continue hydralazine prn  - continue to monitor bp's     8) DM II  - Will add Lantus to sliding scale insulin for better control  CBG (last 3)   Recent Labs  04/18/12 2356 04/19/12 0343 04/19/12 0752  GLUCAP 234* 211* 212*    Lab  Results  Component Value Date   HGBA1C 8.7* 03/02/2012     9) Severe protein calorie malnutrition with albumin 1.9  - continue current tube feeds as listed below.     10) Fever on 04/16/2012 causing worsening of metabolic encephalopathy - etiology unclear could be due to her initial left lower lobe pneumonia. -Repeat blood cultures negative, UA unremarkable, portable chest x-ray stable with no further worsening, much better on broad-spectrum antibiotics, we will continue to monitor closely.     Code Status: Full  Family Communication:   Disposition Plan: SNF   Procedures CT brain, EEG, PICC   Consults  Neurology   DVT Prophylaxis  Lovenox    Lab Results  Component  Value Date   PLT 350 04/19/2012    Medications  Scheduled Meds: . antiseptic oral rinse  15 mL Mouth Rinse q12n4p  . chlorhexidine  15 mL Mouth Rinse BID  . docusate  100 mg Per Tube BID  . enoxaparin (LOVENOX) injection  40 mg Subcutaneous Q24H  . fenofibrate  160 mg Oral Daily  . ferrous sulfate  300 mg Per Tube Q breakfast  . folic acid  1 mg Per Tube Daily  . free water  200 mL Per Tube Q4H  . hydrocortisone cream   Topical TID  . insulin aspart  0-15 Units Subcutaneous Q4H  . levETIRAcetam  1,000 mg Intravenous Q12H  . magnesium sulfate 1 - 4 g bolus IVPB  2 g Intravenous Once  . metoprolol tartrate  75 mg Per Tube BID  . multivitamin with minerals  1 tablet Per Tube Daily  . PARoxetine  40 mg Per Tube q morning - 10a  . phenytoin (DILANTIN) IV  300 mg Intravenous Q1500  . piperacillin-tazobactam (ZOSYN)  IV  3.375 g Intravenous Q8H  . potassium chloride  40 mEq Oral Once  . ranitidine  150 mg Per Tube Daily  . thiamine  100 mg Per Tube Daily   Or  . thiamine  100 mg Intravenous Daily  . vancomycin  750 mg Intravenous Q12H   Continuous Infusions: . feeding supplement (JEVITY 1.2 CAL) 1,000 mL (04/18/12 1555)   PRN Meds:.hydrALAZINE, levalbuterol, metoprolol, ondansetron, polyethylene glycol, sennosides, sodium chloride  Antibiotics    Anti-infectives   Start     Dose/Rate Route Frequency Ordered Stop   04/16/12 2000  piperacillin-tazobactam (ZOSYN) IVPB 3.375 g     3.375 g 12.5 mL/hr over 240 Minutes Intravenous Every 8 hours 04/16/12 1854     04/16/12 2000  vancomycin (VANCOCIN) 750 mg in sodium chloride 0.9 % 150 mL IVPB     750 mg 150 mL/hr over 60 Minutes Intravenous Every 12 hours 04/16/12 1915     04/14/12 1200  levofloxacin (LEVAQUIN) tablet 750 mg  Status:  Discontinued     750 mg Per Tube Daily 04/14/12 1129 04/16/12 1830   04/11/12 1443  ceFEPIme (MAXIPIME) 1 g in dextrose 5 % 50 mL IVPB  Status:  Discontinued     1 g 100 mL/hr over 30 Minutes  Intravenous 3 times per day 04/11/12 1443 04/14/12 0929   04/10/12 1600  vancomycin (VANCOCIN) 750 mg in sodium chloride 0.9 % 150 mL IVPB  Status:  Discontinued     750 mg 150 mL/hr over 60 Minutes Intravenous Every 12 hours 04/10/12 1525 04/14/12 0929   04/08/12 2200  vancomycin (VANCOCIN) IVPB 1000 mg/200 mL premix  Status:  Discontinued     1,000 mg 200 mL/hr over 60 Minutes Intravenous Every  12 hours 04/08/12 1217 04/10/12 0953   04/08/12 1400  ceFEPIme (MAXIPIME) 1 g in dextrose 5 % 50 mL IVPB  Status:  Discontinued     1 g 100 mL/hr over 30 Minutes Intravenous 3 times per day 04/08/12 1202 04/11/12 1443   04/08/12 1300  levofloxacin (LEVAQUIN) IVPB 750 mg     750 mg 100 mL/hr over 90 Minutes Intravenous Every 24 hours 04/08/12 1202 04/10/12 1413   04/08/12 0900  vancomycin (VANCOCIN) IVPB 1000 mg/200 mL premix     1,000 mg 200 mL/hr over 60 Minutes Intravenous To Emergency Dept 04/08/12 0808 04/08/12 1038   04/08/12 0900  piperacillin-tazobactam (ZOSYN) IVPB 3.375 g     3.375 g 100 mL/hr over 30 Minutes Intravenous To Emergency Dept 04/08/12 0808 04/08/12 1012       Time Spent in minutes  35   Susa Raring K M.D on 04/19/2012 at 10:51 AM  Between 7am to 7pm - Pager - [phone]  After 7pm go to [email or web address] - password [email or web address]  And look for the night coverage person covering for me after hours  [facility] Office  [phone]    Subjective:   [name] today is in bed, unreliable historian, answers a few questions, no distress noted.Denies headache-chest-abd pain.  Objective:   Filed Vitals:   04/19/12 0200 04/19/12 0400 04/19/12 0700 04/19/12 0800  BP: 160/76 147/68 180/84 168/76  Pulse: 97 98 108 108  Temp:  99.1 F (37.3 C)  98.9 F (37.2 C)  TempSrc:  Core (Comment)  Core (Comment)  Resp: 11 11 17 10   Height:      Weight:  74 kg (163 lb 2.3 oz)    SpO2: 98% 98% 98% 97%    Wt Readings from Last 3 Encounters:  04/19/12 74 kg (163  lb 2.3 oz)  03/05/12 78.336 kg (172 lb 11.2 oz)     Intake/Output Summary (Last 24 hours) at 04/19/12 1051 Last data filed at 04/19/12 0800  Gross per 24 hour  Intake   3825 ml  Output   3423 ml  Net    402 ml    Exam Awake answers a few questions [place].AT,PERRAL Supple Neck,No JVD, No cervical lymphadenopathy appriciated.  Symmetrical Chest wall movement, Good air movement bilaterally, CTAB RRR,No Gallops,Rubs or new Murmurs, No Parasternal Heave +ve B.Sounds, Abd Soft, Non tender, No organomegaly appriciated, No rebound - guarding or rigidity. PEG tube in place No Cyanosis, Clubbing or edema, No new Rash or bruise     Data Review   Micro Results Recent Results (from the past 240 hour(s))  CULTURE, BLOOD (ROUTINE X 2)     Status: None   Collection Time    04/10/12  8:10 AM      Result Value Range Status   Specimen Description BLOOD LEFT ARM   Final   Special Requests BOTTLES DRAWN AEROBIC AND ANAEROBIC 5CC   Final   Culture  Setup Time 04/10/2012 13:46   Final   Culture NO GROWTH 5 DAYS   Final   Report Status 04/16/2012 FINAL   Final  CULTURE, BLOOD (ROUTINE X 2)     Status: None   Collection Time    04/10/12  8:20 AM      Result Value Range Status   Specimen Description BLOOD LEFT ARM   Final   Special Requests BOTTLES DRAWN AEROBIC ONLY 1CC   Final   Culture  Setup Time 04/10/2012 13:46   Final   Culture NO GROWTH 5  DAYS   Final   Report Status 04/16/2012 FINAL   Final  URINE CULTURE     Status: None   Collection Time    04/16/12  7:21 PM      Result Value Range Status   Specimen Description URINE, CATHETERIZED   Final   Special Requests levaquin (d/c'ed) Normal   Final   Culture  Setup Time 04/17/2012 03:02   Final   Colony Count NO GROWTH   Final   Culture NO GROWTH   Final   Report Status 04/17/2012 FINAL   Final  CULTURE, BLOOD (ROUTINE X 2)     Status: None   Collection Time    04/16/12 10:00 PM      Result Value Range Status   Specimen Description  BLOOD LEFT ARM   Final   Special Requests BOTTLES DRAWN AEROBIC AND ANAEROBIC   Final   Culture  Setup Time 04/17/2012 02:54   Final   Culture     Final   Value:        BLOOD CULTURE RECEIVED NO GROWTH TO DATE CULTURE WILL BE HELD FOR 5 DAYS BEFORE ISSUING A FINAL NEGATIVE REPORT   Report Status PENDING   Incomplete  CULTURE, BLOOD (ROUTINE X 2)     Status: None   Collection Time    04/16/12 10:19 PM      Result Value Range Status   Specimen Description BLOOD LEFT   Final   Special Requests BOTTLES DRAWN AEROBIC AND ANAEROBIC   Final   Culture  Setup Time 04/17/2012 02:54   Final   Culture     Final   Value:        BLOOD CULTURE RECEIVED NO GROWTH TO DATE CULTURE WILL BE HELD FOR 5 DAYS BEFORE ISSUING A FINAL NEGATIVE REPORT   Report Status PENDING   Incomplete    Radiology Reports    Ct Head Wo Contrast  04/08/2012  *RADIOLOGY REPORT*  Clinical Data: Altered mental status.  CT HEAD WITHOUT CONTRAST  Technique:  Contiguous axial images were obtained from the base of the skull through the vertex without contrast.  Comparison: 07/30/2009  Findings: Postsurgical changes in the right frontal lobe.  These are stable since prior study.  Atrophy.  Associated ventriculomegaly, likely related to ex vacuo dilatation.  No acute infarction or hemorrhage.  No extra-axial fluid collection.  No acute calvarial abnormality. Visualized paranasal sinuses and mastoids clear.  Orbital soft tissues unremarkable.  IMPRESSION: No acute intracranial abnormality.  No change since prior study.   Original Report Authenticated By: Charlett Nose, M.D.    Dg Chest Port 1 View  04/17/2012  *RADIOLOGY REPORT*  Clinical Data: Weakness, shortness of breath  PORTABLE CHEST - 1 VIEW  Comparison: Portable chest x-ray of 04/11/2012  Findings: There is still slight elevation of the right hemidiaphragm.  No infiltrate or effusion is seen.  The right PICC line is unchanged in position, with the tip overlying the mid upper  SVC.  Heart size is stable.  IMPRESSION: No active infiltrate or effusion.  No change in right PICC line position.   Original Report Authenticated By: Dwyane Dee, M.D.      Dg Abd Portable 1v  04/16/2012  *RADIOLOGY REPORT*  Clinical Data: Vomiting.  PORTABLE ABDOMEN - 1 VIEW  Comparison: Abdominal radiographs dated 03/22/2012 and CT scan dated 03/03/2012  Findings: There is minimal air in the bowel.  No dilated loops of bowel.  No visible free air on this  supine radiograph.  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  IMPRESSION: Benign-appearing abdomen.   Original Report Authenticated By: Francene Boyers, M.D.     [facility]  Recent Labs Lab 04/15/12 0415 04/16/12 0630 04/17/12 0350 04/18/12 0430 04/19/12 0500  WBC 8.2 8.9 9.1 8.2 10.4  HGB 10.8* 11.3* 9.8* 10.3* 10.9*  HCT 33.9* 35.6* 30.3* 32.3* 33.9*  PLT 351 409* 334 321 350  MCV 89.7 89.4 88.9 90.0 88.7  MCH 28.6 28.4 28.7 28.7 28.5  MCHC 31.9 31.7 32.3 31.9 32.2  RDW 15.2 15.1 15.4 15.1 15.3    Chemistries   Recent Labs Lab 04/16/12 0630 04/16/12 0905 04/17/12 0350 04/18/12 0430 04/18/12 1540 04/19/12 0500  NA 141 136 136 138  --  134*  K 3.3* 3.7 3.5 3.0* 3.9 3.5  CL 102 97 101 103  --  100  CO2 25 24 25 23   --  24  GLUCOSE 82 174* 130* 163*  --  238*  BUN 9 9 12 9   --  6  CREATININE 0.38* 0.38* 0.50 0.42*  --  0.39*  CALCIUM 9.2 9.1 8.5 8.5  --  9.0  MG 1.6  --  1.7 1.7 1.9 1.7   ------------------------------------------------------------------------------------------------------------------ estimated creatinine clearance is 63.9 ml/min (by C-G formula based on Cr of 0.39). ------------------------------------------------------------------------------------------------------------------ No results found for this basename: HGBA1C,  in the last 72 hours ------------------------------------------------------------------------------------------------------------------ No results found for this basename: CHOL,  HDL, LDLCALC, TRIG, CHOLHDL, LDLDIRECT,  in the last 72 hours ------------------------------------------------------------------------------------------------------------------ No results found for this basename: TSH, T4TOTAL, FREET3, T3FREE, THYROIDAB,  in the last 72 hours ------------------------------------------------------------------------------------------------------------------ No results found for this basename: VITAMINB12, FOLATE, FERRITIN, TIBC, IRON, RETICCTPCT,  in the last 72 hours  Coagulation profile No results found for this basename: INR, PROTIME,  in the last 168 hours  No results found for this basename: DDIMER,  in the last 72 hours  Cardiac Enzymes  Recent Labs Lab 04/16/12 0905 04/16/12 1500 04/16/12 2200  TROPONINI <0.30 <0.30 <0.30   ------------------------------------------------------------------------------------------------------------------ No components found with this basename: POCBNP,

## 2012-04-19 NOTE — Progress Notes (Signed)
Inpatient Diabetes Program Recommendations  AACE/ADA: New Consensus Statement on Inpatient Glycemic Control (2013)  Target Ranges:  Prepandial:   less than 140 mg/dL      Peak postprandial:   less than 180 mg/dL (1-2 hours)      Critically ill patients:  140 - 180 mg/dL   Reason for Visit: Hyperglycemia Results for [name], [name] (MRN [id number]) as of 04/19/2012 16:00  Ref. Range 04/18/2012 11:43 04/18/2012 17:33 04/18/2012 20:46 04/18/2012 23:56 04/19/2012 03:43 04/19/2012 07:52 04/19/2012 11:52  Glucose-Capillary Latest Range: 70-99 mg/dL 440 (H) 347 (H) 425 (H) 234 (H) 211 (H) 212 (H) 259 (H)    Inpatient Diabetes Program Recommendations Insulin - Basal: Pt previously on Levemir 60 units bid at home.  Consider Levemir 20 units bid in place of Lantus.  Will need to uptitrate until FBS < 140 mg/dL Insulin - Meal Coverage: Add TF coverage - Novolog 4 units Q4 hours for CHO coverage.    Note: Will continue to follow.  Thank you. Ailene Ards, RD, LDN, CDE Inpatient Diabetes Coordinator [phone]

## 2012-04-19 NOTE — Clinical Social Work Note (Signed)
Please note that the current plan is for husband to take Pt home as she is bed bound at baseline. He has an aide to assist with Pt at home. CSW signing off as no other needs noted currently.  Pt will not qualify for SNF unless there is a skilled nursing need.   Doreen Salvage, LCSW ICU/Stepdown Clinical Social Worker [facility] Cell [phone] Hours 8am-1200pm M-F

## 2012-04-19 NOTE — Progress Notes (Signed)
ANTIBIOTIC CONSULT NOTE - FOLLOW UP  Pharmacy Consult for Vancomycin/Zosyn Indication: pneumonia  Allergies  Allergen Reactions  . Codeine Nausea And Vomiting  . Morphine And Related Nausea And Vomiting    Patient Measurements: Height: 5\' 3"  (160 cm) Weight: 163 lb 2.3 oz (74 kg) IBW/kg (Calculated) : 52.4  Vital Signs: Temp: 98.9 F (37.2 C) (03/13 0800) Temp src: Core (Comment) (03/13 0800) BP: 168/76 mmHg (03/13 0800) Pulse Rate: 108 (03/13 0800) Intake/Output from previous day: 03/12 0701 - 03/13 0700 In: 4115 [I.V.:1275; IV Piggyback:850] Out: 3303 [Urine:3300; Stool:3] Intake/Output from this shift: Total I/O In: 800 [I.V.:600; Other:200] Out: 450 [Urine:450]  Labs:  Recent Labs  04/17/12 0350 04/18/12 0430 04/19/12 0500  WBC 9.1 8.2 10.4  HGB 9.8* 10.3* 10.9*  PLT 334 321 350  CREATININE 0.50 0.42* 0.39*   Estimated Creatinine Clearance: 63.9 ml/min (by C-G formula based on Cr of 0.39).  Microbiology: 3/2 blood: 1of2 Coag neg staph 3/4 blood: NGF x2 3/2 urine: NGF 32: influenza panel negaive, urine legionella/strep negative 3/10: blood: NGTD x2 --> new cxs drawn d/t new fever 3/10: Urine: NGF  Assessment: 71 YOF on day #4 vancomycin 750 mg IV q12h and Zosyn 3.375g IV q8h for possible pneumonia.  Broad spectrum antibiotics restarted when pt experienced new fever 4 days ago.  New cultures show no growth to date.  Vancomycin restarted at previous dose which provided therapeutic levels when trough level was checked 3/7.  Renal function stable.  Per progress note, MD plans on de-escalating to oral antibiotics tomorrow if patient continues to demonstrate clinical improvement.  If vancomycin continued, will plan on re-checking a trough level tomorrow to make sure patient isn't accumulating drug.  Goal of Therapy:  Vancomycin trough level 15-20 mcg/ml  Plan:  Continue vancomycin and zosyn at current dosing. F/u plan for narrowing abx tomorrow.  Clance Boll 04/19/2012,12:05 PM

## 2012-04-20 LAB — GLUCOSE, CAPILLARY
Glucose-Capillary: 211 mg/dL — ABNORMAL HIGH (ref 70–99)
Glucose-Capillary: 219 mg/dL — ABNORMAL HIGH (ref 70–99)
Glucose-Capillary: 246 mg/dL — ABNORMAL HIGH (ref 70–99)
Glucose-Capillary: 247 mg/dL — ABNORMAL HIGH (ref 70–99)

## 2012-04-20 LAB — CBC
Hemoglobin: 10.1 g/dL — ABNORMAL LOW (ref 12.0–15.0)
MCH: 28.2 pg (ref 26.0–34.0)
MCV: 91.1 fL (ref 78.0–100.0)
RBC: 3.58 MIL/uL — ABNORMAL LOW (ref 3.87–5.11)

## 2012-04-20 LAB — RENAL FUNCTION PANEL
CO2: 26 mEq/L (ref 19–32)
Calcium: 9.2 mg/dL (ref 8.4–10.5)
Creatinine, Ser: 0.47 mg/dL — ABNORMAL LOW (ref 0.50–1.10)
GFR calc Af Amer: >90 mL/min (ref >90)
Glucose, Bld: 250 mg/dL — ABNORMAL HIGH (ref 70–99)

## 2012-04-20 LAB — OSMOLALITY, URINE: Osmolality, Ur: 383 mOsm/kg — ABNORMAL LOW (ref 390–1090)

## 2012-04-20 MED ORDER — AMOXICILLIN-POT CLAVULANATE 875-125 MG PO TABS
1.0000 | ORAL_TABLET | Freq: Two times a day (BID) | ORAL | Status: DC
  Administered 2012-04-20 – 2012-04-21 (×3): 1 via ORAL
  Filled 2012-04-20 (×4): qty 1

## 2012-04-20 MED ORDER — INSULIN GLARGINE 100 UNIT/ML ~~LOC~~ SOLN
15.0000 [IU] | Freq: Two times a day (BID) | SUBCUTANEOUS | Status: DC
  Administered 2012-04-20 – 2012-04-21 (×3): 15 [IU] via SUBCUTANEOUS

## 2012-04-20 NOTE — Progress Notes (Signed)
[facility]                                                                                Patient Demographics  [name], is a 72 y.o. female, DOB - February 22, 1940, ONG:[id number], GMW:[id number]  Admit date - 04/08/2012  Admitting Physician Renae Fickle, MD  Outpatient Primary MD for the patient is No primary provider on file.  LOS - 12   No chief complaint on file.       Assessment & Plan    Assumed care of the patient 04/18/2012 on the day 10 of her hospital stay.    Brief summary   This is a 72 year old female with history of cerebral palsy, mental titration, not in system, diabetes mellitus type 2, renal cancer, dysphagia with history of recurrent aspiration pneumonia status post PEG tube, who was admitted on 04-2012 with altered mental status, bladder spasms and fever. Patient's primary caretaker is her husband and the history was provided by him upon admission. Upon admission patient had evidence of left lower lobe pneumonia, elevated blood sugars, hypokalemia, all contemplating to metabolic encephalopathy with decreased mental status, she was seen by neurology, her mental status declined on 04/16/2012 which prompted a neurology consult, differential at that time was seizure activity versus CVA. CT of the head was negative.  Patient also had a false positive blood culture with coag-negative staph, she continues to be on broad-spectrum antibiotics for healthcare associated pneumonia, currently clinically improving.    1) Metabolic Encephalopathy in patient with MR - with breakthrough seizure activity  on 04/16/2012 due to fevers causing worsening of mental status. Which has started to improve since 04/18/2012 - Most likely multifactorial given Pneumonia and per initial reports along with metabolic acidosis, EEG consistent with metabolic encephalopathy, CT head negative. -Continue seizure medications per neurology, along with supportive care, mentation  much better now likely close to her baseline.    2) HAP  - flu negative  - Urine legionella neg  - step pneumonia Ag neg  - stable now, repeat CXR and WBC counts stable, afebrile, taper to Augmentin 04-20-12    3) Positive blood culture  - could likely be contaminant. Subsequent blood cultures have been negative. I suspect that initial 1 positive blood culture was a contaminant.  - patient is non toxic appearing , Had been Discussed with ID per rounding hospitalist and they agree that initial positive blood culture was likely contaminant.     4) Hypokalemia  - replaced k stable, will monitor potassium and magnesium closely.    6) Anxiety and depression  - continue home regimen paxil     7) HTN, UNCONTROLLED  -On norvasc, increase metoprolol added 3/8  - continue hydralazine prn  - continue to monitor bp's     8) DM II  - increased to BID Lantus and continue sliding scale insulin for better control  CBG (last 3)   Recent Labs  04/20/12 0029 04/20/12 0435 04/20/12 0759  GLUCAP 247* 211* 219*    Lab Results  Component Value Date   HGBA1C 8.7* 03/02/2012     9) Severe protein calorie malnutrition with albumin 1.9  - continue current tube feeds as  listed below.     10) Fever on 04/16/2012 causing worsening of metabolic encephalopathy/Seizure -   due to her initial left lower lobe pneumonia.  -Repeat blood cultures negative, UA unremarkable, portable chest x-ray stable with no further worsening, tapered to oral antibiotic on 04-20-12, we will continue to monitor closely.     Code Status: Full  Family Communication:   Disposition Plan: SNF   Procedures CT brain, EEG, PICC   Consults  Neurology   DVT Prophylaxis  Lovenox    Lab Results  Component Value Date   PLT 356 04/20/2012    Medications  Scheduled Meds: . amoxicillin-clavulanate  1 tablet Oral Q12H  . antiseptic oral rinse  15 mL Mouth Rinse q12n4p  . chlorhexidine  15 mL Mouth  Rinse BID  . clonazePAM  0.5 mg Per Tube BID  . docusate  100 mg Per Tube BID  . enoxaparin (LOVENOX) injection  40 mg Subcutaneous Q24H  . fenofibrate  160 mg Oral Daily  . ferrous sulfate  300 mg Per Tube Q breakfast  . folic acid  1 mg Per Tube Daily  . free water  200 mL Per Tube Q4H  . hydrocortisone cream   Topical TID  . insulin aspart  0-15 Units Subcutaneous Q4H  . insulin glargine  15 Units Subcutaneous BID  . levETIRAcetam  1,000 mg Intravenous Q12H  . metoprolol tartrate  75 mg Per Tube BID  . multivitamin with minerals  1 tablet Per Tube Daily  . PARoxetine  40 mg Per Tube q morning - 10a  . phenytoin (DILANTIN) IV  300 mg Intravenous Q1500  . ranitidine  150 mg Per Tube Daily  . thiamine  100 mg Per Tube Daily   Or  . thiamine  100 mg Intravenous Daily   Continuous Infusions: . feeding supplement (JEVITY 1.2 CAL) 1,000 mL (04/19/12 1416)   PRN Meds:.hydrALAZINE, levalbuterol, metoprolol, ondansetron, polyethylene glycol, sennosides, sodium chloride  Antibiotics    Anti-infectives   Start     Dose/Rate Route Frequency Ordered Stop   04/20/12 0900  amoxicillin-clavulanate (AUGMENTIN) 875-125 MG per tablet 1 tablet     1 tablet Oral Every 12 hours 04/20/12 0828     04/16/12 2000  piperacillin-tazobactam (ZOSYN) IVPB 3.375 g  Status:  Discontinued     3.375 g 12.5 mL/hr over 240 Minutes Intravenous Every 8 hours 04/16/12 1854 04/20/12 0828   04/16/12 2000  vancomycin (VANCOCIN) 750 mg in sodium chloride 0.9 % 150 mL IVPB  Status:  Discontinued     750 mg 150 mL/hr over 60 Minutes Intravenous Every 12 hours 04/16/12 1915 04/20/12 0828   04/14/12 1200  levofloxacin (LEVAQUIN) tablet 750 mg  Status:  Discontinued     750 mg Per Tube Daily 04/14/12 1129 04/16/12 1830   04/11/12 1443  ceFEPIme (MAXIPIME) 1 g in dextrose 5 % 50 mL IVPB  Status:  Discontinued     1 g 100 mL/hr over 30 Minutes Intravenous 3 times per day 04/11/12 1443 04/14/12 0929   04/10/12 1600   vancomycin (VANCOCIN) 750 mg in sodium chloride 0.9 % 150 mL IVPB  Status:  Discontinued     750 mg 150 mL/hr over 60 Minutes Intravenous Every 12 hours 04/10/12 1525 04/14/12 0929   04/08/12 2200  vancomycin (VANCOCIN) IVPB 1000 mg/200 mL premix  Status:  Discontinued     1,000 mg 200 mL/hr over 60 Minutes Intravenous Every 12 hours 04/08/12 1217 04/10/12 0953   04/08/12 1400  ceFEPIme (MAXIPIME) 1 g in dextrose 5 % 50 mL IVPB  Status:  Discontinued     1 g 100 mL/hr over 30 Minutes Intravenous 3 times per day 04/08/12 1202 04/11/12 1443   04/08/12 1300  levofloxacin (LEVAQUIN) IVPB 750 mg     750 mg 100 mL/hr over 90 Minutes Intravenous Every 24 hours 04/08/12 1202 04/10/12 1413   04/08/12 0900  vancomycin (VANCOCIN) IVPB 1000 mg/200 mL premix     1,000 mg 200 mL/hr over 60 Minutes Intravenous To Emergency Dept 04/08/12 0808 04/08/12 1038   04/08/12 0900  piperacillin-tazobactam (ZOSYN) IVPB 3.375 g     3.375 g 100 mL/hr over 30 Minutes Intravenous To Emergency Dept 04/08/12 0808 04/08/12 1012       Time Spent in minutes  35   Susa Raring K M.D on 04/20/2012 at 9:51 AM  Between 7am to 7pm - Pager - [phone]  After 7pm go to [email or web address] - password [email or web address]  And look for the night coverage person covering for me after hours  [facility] Office  [phone]    Subjective:   [name] today is in bed,   no distress noted.Denies headache-chest-abd pain. No SOB.  Objective:   Filed Vitals:   04/19/12 1618 04/19/12 2019 04/19/12 2021 04/20/12 0438  BP: 139/68 171/68 147/54 144/53  Pulse: 87 90  82  Temp: 98.5 F (36.9 C) 99.8 F (37.7 C)  97.9 F (36.6 C)  TempSrc: Axillary Oral  Oral  Resp: 16 16  16   Height:      Weight:      SpO2: 97% 98%  97%    Wt Readings from Last 3 Encounters:  04/19/12 74 kg (163 lb 2.3 oz)  03/05/12 78.336 kg (172 lb 11.2 oz)     Intake/Output Summary (Last 24 hours) at 04/20/12 0951 Last data filed at  04/20/12 0700  Gross per 24 hour  Intake   2465 ml  Output   2900 ml  Net   -435 ml    Exam Awake answers questions [place].AT,PERRAL Supple Neck,No JVD, No cervical lymphadenopathy appriciated.  Symmetrical Chest wall movement, Good air movement bilaterally, CTAB RRR,No Gallops,Rubs or new Murmurs, No Parasternal Heave +ve B.Sounds, Abd Soft, Non tender, No organomegaly appriciated, No rebound - guarding or rigidity. PEG tube in place No Cyanosis, Clubbing or edema, No new Rash or bruise     Data Review   Micro Results Recent Results (from the past 240 hour(s))  URINE CULTURE     Status: None   Collection Time    04/16/12  7:21 PM      Result Value Range Status   Specimen Description URINE, CATHETERIZED   Final   Special Requests levaquin (d/c'ed) Normal   Final   Culture  Setup Time 04/17/2012 03:02   Final   Colony Count NO GROWTH   Final   Culture NO GROWTH   Final   Report Status 04/17/2012 FINAL   Final  CULTURE, BLOOD (ROUTINE X 2)     Status: None   Collection Time    04/16/12 10:00 PM      Result Value Range Status   Specimen Description BLOOD LEFT ARM   Final   Special Requests BOTTLES DRAWN AEROBIC AND ANAEROBIC   Final   Culture  Setup Time 04/17/2012 02:54   Final   Culture     Final   Value:        BLOOD CULTURE RECEIVED NO GROWTH TO DATE CULTURE  WILL BE HELD FOR 5 DAYS BEFORE ISSUING A FINAL NEGATIVE REPORT   Report Status PENDING   Incomplete  CULTURE, BLOOD (ROUTINE X 2)     Status: None   Collection Time    04/16/12 10:19 PM      Result Value Range Status   Specimen Description BLOOD LEFT   Final   Special Requests BOTTLES DRAWN AEROBIC AND ANAEROBIC   Final   Culture  Setup Time 04/17/2012 02:54   Final   Culture     Final   Value:        BLOOD CULTURE RECEIVED NO GROWTH TO DATE CULTURE WILL BE HELD FOR 5 DAYS BEFORE ISSUING A FINAL NEGATIVE REPORT   Report Status PENDING   Incomplete    Radiology Reports    Ct Head Wo  Contrast  04/08/2012  *RADIOLOGY REPORT*  Clinical Data: Altered mental status.  CT HEAD WITHOUT CONTRAST  Technique:  Contiguous axial images were obtained from the base of the skull through the vertex without contrast.  Comparison: 07/30/2009  Findings: Postsurgical changes in the right frontal lobe.  These are stable since prior study.  Atrophy.  Associated ventriculomegaly, likely related to ex vacuo dilatation.  No acute infarction or hemorrhage.  No extra-axial fluid collection.  No acute calvarial abnormality. Visualized paranasal sinuses and mastoids clear.  Orbital soft tissues unremarkable.  IMPRESSION: No acute intracranial abnormality.  No change since prior study.   Original Report Authenticated By: Charlett Nose, M.D.    Dg Chest Port 1 View  04/17/2012  *RADIOLOGY REPORT*  Clinical Data: Weakness, shortness of breath  PORTABLE CHEST - 1 VIEW  Comparison: Portable chest x-ray of 04/11/2012  Findings: There is still slight elevation of the right hemidiaphragm.  No infiltrate or effusion is seen.  The right PICC line is unchanged in position, with the tip overlying the mid upper SVC.  Heart size is stable.  IMPRESSION: No active infiltrate or effusion.  No change in right PICC line position.   Original Report Authenticated By: Dwyane Dee, M.D.      Dg Abd Portable 1v  04/16/2012  *RADIOLOGY REPORT*  Clinical Data: Vomiting.  PORTABLE ABDOMEN - 1 VIEW  Comparison: Abdominal radiographs dated 03/22/2012 and CT scan dated 03/03/2012  Findings: There is minimal air in the bowel.  No dilated loops of bowel.  No visible free air on this supine radiograph.  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  IMPRESSION: Benign-appearing abdomen.   Original Report Authenticated By: Francene Boyers, M.D.     [facility]  Recent Labs Lab 04/16/12 0630 04/17/12 0350 04/18/12 0430 04/19/12 0500 04/20/12 0450  WBC 8.9 9.1 8.2 10.4 7.1  HGB 11.3* 9.8* 10.3* 10.9* 10.1*  HCT 35.6* 30.3* 32.3* 33.9* 32.6*  PLT  409* 334 321 350 356  MCV 89.4 88.9 90.0 88.7 91.1  MCH 28.4 28.7 28.7 28.5 28.2  MCHC 31.7 32.3 31.9 32.2 31.0  RDW 15.1 15.4 15.1 15.3 15.5    Chemistries   Recent Labs Lab 04/16/12 0905 04/17/12 0350 04/18/12 0430 04/18/12 1540 04/19/12 0500 04/20/12 0450  NA 136 136 138  --  134* 137  K 3.7 3.5 3.0* 3.9 3.5 3.9  CL 97 101 103  --  100 103  CO2 24 25 23   --  24 26  GLUCOSE 174* 130* 163*  --  238* 250*  BUN 9 12 9   --  6 7  CREATININE 0.38* 0.50 0.42*  --  0.39* 0.47*  CALCIUM 9.1  8.5 8.5  --  9.0 9.2  MG  --  1.7 1.7 1.9 1.7 2.1   ------------------------------------------------------------------------------------------------------------------ estimated creatinine clearance is 63.9 ml/min (by C-G formula based on Cr of 0.47). ------------------------------------------------------------------------------------------------------------------ No results found for this basename: HGBA1C,  in the last 72 hours ------------------------------------------------------------------------------------------------------------------ No results found for this basename: CHOL, HDL, LDLCALC, TRIG, CHOLHDL, LDLDIRECT,  in the last 72 hours ------------------------------------------------------------------------------------------------------------------ No results found for this basename: TSH, T4TOTAL, FREET3, T3FREE, THYROIDAB,  in the last 72 hours ------------------------------------------------------------------------------------------------------------------ No results found for this basename: VITAMINB12, FOLATE, FERRITIN, TIBC, IRON, RETICCTPCT,  in the last 72 hours  Coagulation profile No results found for this basename: INR, PROTIME,  in the last 168 hours  No results found for this basename: DDIMER,  in the last 72 hours  Cardiac Enzymes  Recent Labs Lab 04/16/12 0905 04/16/12 1500 04/16/12 2200  TROPONINI <0.30 <0.30 <0.30    ------------------------------------------------------------------------------------------------------------------ No components found with this basename: POCBNP,

## 2012-04-20 NOTE — Progress Notes (Signed)
NEURO HOSPITALIST PROGRESS NOTE   SUBJECTIVE:                                                                                                                        Patient in bed comfortable, husband at bedside stating she is back to her baseline. He desires to have her discharged.   OBJECTIVE:                                                                                                                           Vital signs in last 24 hours: Temp:  [97.9 F (36.6 C)-99.8 F (37.7 C)] 99.2 F (37.3 C) (03/14 1335) Pulse Rate:  [80-90] 80 (03/14 1335) Resp:  [16-18] 18 (03/14 1335) BP: (139-171)/(53-68) 165/67 mmHg (03/14 1335) SpO2:  [97 %-98 %] 97 % (03/14 1335)  Intake/Output from previous day: 03/13 0701 - 03/14 0700 In: 3265 [I.V.:660; IV Piggyback:580] Out: 3350 [Urine:3350] Intake/Output this shift: Total I/O In: 400 [Other:400] Out: -  Nutritional status: NPO  Past Medical History  Diagnosis Date  . Diabetes mellitus   . Renal disorder   . CP (cerebral palsy)   . Renal cancer     s/p nephrectomy  . Parkinsonism   . Dysphagia   . Recurrent aspiration pneumonia   . Bladder spasms   . Amputation of arm   . Recurrent UTI     Neurologic Exam:   Mental Status: Alert, not oriented and asking me if I am Dr. Tiburcio Pea.  She will converse with me and shows no aphasia. Follows commands. Cranial Nerves: II: Visual fields grossly normal blinks to threat bilaterally, pupils equal, round, reactive to light and accommodation III,IV, VI: ptosis not present, extra-ocular motions intact bilaterally V,VII: smile symmetric, facial light touch sensation normal bilaterally VIII: hearing normal bilaterally XII: midline tongue extension Motor: Postural tremor in UE with decreased strength bilaterally. Left LE fused at the knee and no spontaneous movement in right leg (baseline) Sensory: Pinprick and light touch intact throughout,  bilaterally Deep Tendon Reflexes: 2+ in the upper extremities and absent in the lower extremities.  Plantars:  Right: mute Left: mute    Lab Results: No results found for this basename: cbc, bmp, coags, chol, tri, ldl, hga1c  Lipid Panel No results found for this basename: CHOL, TRIG, HDL, CHOLHDL, VLDL, LDLCALC,  in the last 72 hours  Studies/Results: No results found.  MEDICATIONS                                                                                                                        Scheduled: . amoxicillin-clavulanate  1 tablet Oral Q12H  . antiseptic oral rinse  15 mL Mouth Rinse q12n4p  . chlorhexidine  15 mL Mouth Rinse BID  . clonazePAM  0.5 mg Per Tube BID  . docusate  100 mg Per Tube BID  . enoxaparin (LOVENOX) injection  40 mg Subcutaneous Q24H  . fenofibrate  160 mg Oral Daily  . ferrous sulfate  300 mg Per Tube Q breakfast  . folic acid  1 mg Per Tube Daily  . free water  200 mL Per Tube Q4H  . hydrocortisone cream   Topical TID  . insulin aspart  0-15 Units Subcutaneous Q4H  . insulin glargine  15 Units Subcutaneous BID  . levETIRAcetam  1,000 mg Intravenous Q12H  . metoprolol tartrate  75 mg Per Tube BID  . multivitamin with minerals  1 tablet Per Tube Daily  . PARoxetine  40 mg Per Tube q morning - 10a  . phenytoin (DILANTIN) IV  300 mg Intravenous Q1500  . ranitidine  150 mg Per Tube Daily  . thiamine  100 mg Per Tube Daily   Or  . thiamine  100 mg Intravenous Daily    ASSESSMENT/PLAN:                                                                                                                 Assessment and plan discussed with with attending physician and they are in agreement.    Felicie Morn PA-C [facility] [phone]  04/20/2012, 2:20 PM                                         NEURO HOSPITALIST PROGRESS NOTE   SUBJECTIVE:  OBJECTIVE:                                                                                                                           Vital signs in last 24 hours: Temp:  [97.9 F (36.6 C)-99.8 F (37.7 C)] 99.2 F (37.3 C) (03/14 1335) Pulse Rate:  [80-90] 80 (03/14 1335) Resp:  [16-18] 18 (03/14 1335) BP: (139-171)/(53-68) 165/67 mmHg (03/14 1335) SpO2:  [97 %-98 %] 97 % (03/14 1335)  Intake/Output from previous day: 03/13 0701 - 03/14 0700 In: 3265 [I.V.:660; IV Piggyback:580] Out: 3350 [Urine:3350] Intake/Output this shift: Total I/O In: 400 [Other:400] Out: -  Nutritional status: NPO  Past Medical History  Diagnosis Date  . Diabetes mellitus   . Renal disorder   . CP (cerebral palsy)   . Renal cancer     s/p nephrectomy  . Parkinsonism   . Dysphagia   . Recurrent aspiration pneumonia   . Bladder spasms   . Amputation of arm   . Recurrent UTI     Neurologic Exam:    Lab Results: No results found for this basename: cbc, bmp, coags, chol, tri, ldl, hga1c   Lipid Panel No results found for this basename: CHOL, TRIG, HDL, CHOLHDL, VLDL, LDLCALC,  in the last 72 hours  Studies/Results: No results found.  MEDICATIONS                                                                                                                        Scheduled: . amoxicillin-clavulanate  1 tablet Oral Q12H  . antiseptic oral rinse  15 mL Mouth Rinse q12n4p  . chlorhexidine  15 mL Mouth Rinse BID  . clonazePAM  0.5 mg Per Tube BID  . docusate  100 mg Per Tube BID  . enoxaparin (LOVENOX) injection  40 mg Subcutaneous Q24H  . fenofibrate  160 mg Oral Daily  . ferrous sulfate  300 mg Per Tube Q breakfast  . folic acid  1 mg Per Tube Daily  . free water  200 mL Per Tube Q4H  . hydrocortisone cream   Topical TID  . insulin aspart  0-15 Units Subcutaneous Q4H  . insulin glargine  15 Units Subcutaneous BID  . levETIRAcetam  1,000 mg  Intravenous Q12H  . metoprolol tartrate  75 mg Per Tube BID  . multivitamin with minerals  1 tablet Per Tube Daily  . PARoxetine  40 mg Per Tube q morning -  10a  . phenytoin (DILANTIN) IV  300 mg Intravenous Q1500  . ranitidine  150 mg Per Tube Daily  . thiamine  100 mg Per Tube Daily   Or  . thiamine  100 mg Intravenous Daily    ASSESSMENT/PLAN:                                                                                                             Seizure: Patient remains seizure free on Keppra and Dilantin. Per husband, she is back to her baseline. EEG showed slowing. Recommend continue Keppra and Dilantin at current dose and continue at time of discharge. Patient will need to follow up with out patient neurologist or PCP for Dilatin levels to be checked.    Assessment and plan discussed with with attending physician and they are in agreement.    Felicie Morn PA-C [facility] [phone]  04/20/2012, 2:20 PM

## 2012-04-21 LAB — GLUCOSE, CAPILLARY
Glucose-Capillary: 207 mg/dL — ABNORMAL HIGH (ref 70–99)
Glucose-Capillary: 236 mg/dL — ABNORMAL HIGH (ref 70–99)

## 2012-04-21 MED ORDER — HYDROCORTISONE 1 % EX CREA: TOPICAL_CREAM | Freq: Three times a day (TID) | CUTANEOUS | Status: DC

## 2012-04-21 MED ORDER — LEVETIRACETAM 1000 MG PO TABS: 1000.0000 mg | ORAL_TABLET | Freq: Two times a day (BID) | ORAL | Status: DC

## 2012-04-21 MED ORDER — BIOTENE DRY MOUTH MT LIQD: 15.0000 mL | Freq: Two times a day (BID) | OROMUCOSAL | Status: DC

## 2012-04-21 MED ORDER — PHENYTOIN SODIUM EXTENDED 100 MG PO CAPS: 300.0000 mg | ORAL_CAPSULE | Freq: Every day | ORAL | Status: DC

## 2012-04-21 MED ORDER — FREESTYLE SYSTEM KIT: 1.0000 | PACK | Freq: Three times a day (TID) | Status: DC

## 2012-04-21 MED ORDER — INSULIN ASPART 100 UNIT/ML ~~LOC~~ SOLN: 0.0000 [IU] | SUBCUTANEOUS | Status: DC

## 2012-04-21 MED ORDER — NYSTATIN 100000 UNIT/GM EX POWD: Freq: Two times a day (BID) | CUTANEOUS | Status: DC

## 2012-04-21 MED ORDER — METOPROLOL TARTRATE 100 MG PO TABS: 100.0000 mg | ORAL_TABLET | Freq: Two times a day (BID) | ORAL | Status: DC

## 2012-04-21 MED ORDER — AMOXICILLIN-POT CLAVULANATE 875-125 MG PO TABS: 1.0000 | ORAL_TABLET | Freq: Two times a day (BID) | ORAL | Status: DC

## 2012-04-21 MED ORDER — JEVITY 1.2 CAL PO LIQD: 237.0000 mL | Freq: Four times a day (QID) | ORAL | Status: DC

## 2012-04-21 MED ORDER — ADULT MULTIVITAMIN W/MINERALS CH: 1.0000 | ORAL_TABLET | Freq: Every day | ORAL | Status: DC

## 2012-04-21 MED ORDER — INSULIN DETEMIR 100 UNIT/ML ~~LOC~~ SOLN: 30.0000 [IU] | Freq: Two times a day (BID) | SUBCUTANEOUS | Status: DC

## 2012-04-21 NOTE — Care Management (Signed)
Cm contacted by RN Gerarda Gunther concerning patient's discharge. Per previous CM notes, pt followed by [facility]. CM faxed MD orders and discharge summary faxed to [facility] at [phone] to alert agency of patient's discharge on 04/21/12. CSW notified of pt's need for non-emergent transportaion.  Roxy Manns Yasuo Phimmasone,RN,BSN [phone]

## 2012-04-21 NOTE — Discharge Summary (Addendum)
[facility]                                                                                   [name], is a 72 y.o. female  DOB Jul 08, 1940  MRN [id number].  Admission date:  04/08/2012  Discharge Date:  04/21/2012  Primary MD  No primary provider on file.  Admitting Physician  Renae Fickle, MD  Admission Diagnosis  Altered mental status [780.97] HCAP (healthcare-associated pneumonia) [486]  Discharge Diagnosis     Active Problems:   Constipation   Fever   Anxiety and depression   Dyslipidemia   Diabetes mellitus   Sinus tachycardia   HCAP (healthcare-associated pneumonia)   Normocytic anemia   Hypokalemia   Metabolic acidosis, normal anion gap (NAG)   Hypocalcemia   Hypomagnesemia   Protein-calorie malnutrition, severe   Encephalopathy, metabolic   Elevated blood pressure   HTN (hypertension)   Focal seizure      Past Medical History  Diagnosis Date  . Diabetes mellitus   . Renal disorder   . CP (cerebral palsy)   . Renal cancer     s/p nephrectomy  . Parkinsonism   . Dysphagia   . Recurrent aspiration pneumonia   . Bladder spasms   . Amputation of arm   . Recurrent UTI     Past Surgical History  Procedure Laterality Date  . 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 Peg tube placement    . Knee surgery    . Nephrectomy       Recommendations for primary care physician for things to follow:      Discharge Diagnoses:   Active Problems:   Constipation   Fever   Anxiety and depression   Dyslipidemia   Diabetes mellitus   Sinus tachycardia   HCAP (healthcare-associated pneumonia)   Normocytic anemia   Hypokalemia   Metabolic acidosis, normal anion gap (NAG)   Hypocalcemia   Hypomagnesemia   Protein-calorie malnutrition, severe   Encephalopathy, metabolic   Elevated blood pressure   HTN (hypertension)   Focal seizure    Discharge Condition: Stable   Diet recommendation: See Discharge  Instructions below   Consults Neurology    History of present illness and  Hospital Course:     Kindly see H&P for history of present illness and admission details, please review complete Labs, Consult reports and Test reports for all details in brief [name], is a 72 y.o. female, in breif     This is a 72 year old female with history of cerebral palsy, mental titration, not in system, diabetes mellitus type 2, renal cancer, dysphagia with history of recurrent aspiration pneumonia status post PEG tube, who was admitted on 04-2012 with altered mental status, bladder spasms and fever. Patient's primary caretaker is her husband and the history was provided by him upon admission.    Upon admission patient had evidence of left lower lobe pneumonia, elevated blood sugars, hypokalemia, all causing metabolic encephalopathy along with lowering seizure threshold causing seizures both in turn  Causing decreased mental status, she was seen by neurology, placed on antiseizure meds along with  ABX for PNA. CT of the head was negative. Patient also had a false positive blood culture with coag-negative staph, she continues to be on broad-spectrum antibiotics for healthcare associated pneumonia, currently clinically at baseline, 5 more days of PO Augmentin for PNA. With treatment of underlying pneumonia and on seizure medications patient's gradually return to her baseline. Her feedings will be continued via PEG tube as before along with free water flushes. She will be getting home health RN and aide.   Case marker and social worker evaluated the patient she did not meet requirements for placement per social work.   However I doubt her husband can provide her total care by himself at home, considering her needs and his age.   She will be discharged home on present dose of Keppra and Dilantin. We'll request one time outpatient neurology followup post discharge. Her repeat chest x-ray  stable.   Patient has underlying history of hypertension she discharged home on Lopressor along with her home medications.     For her diabetes mellitus type 2 home dose long-acting insulin will be continued she will be placed on sliding scale with q. a.c. at bedtime Accu-Cheks. Glucometer and testing supplies will be provided. Her A1c was 8.7 this admission.   Patient due to urinary incontinence had developed severe groin rash for which topical creams will be provided, I am discharging her with Foley catheter which will be removed after one week by home nurse, this is to provide some time for her severe groin rash to heal.    Today   Subjective:   [name] today has no headache,no chest abdominal pain,no new weakness tingling or numbness, feels much better wants to go home today.    Objective:   Blood pressure 148/54, pulse 100, temperature 99 F (37.2 C), temperature source Oral, resp. rate 18, height 5\' 3"  (1.6 m), weight 74 kg (163 lb 2.3 oz), SpO2 97.00%.   Intake/Output Summary (Last 24 hours) at 04/21/12 1110 Last data filed at 04/21/12 1610  Gross per 24 hour  Intake   1980 ml  Output   3100 ml  Net  -1120 ml    Exam Awake Alert, Oriented x 2, No new F.N deficits, Normal affect .AT,PERRAL Supple Neck,No JVD, No cervical lymphadenopathy appriciated.  Symmetrical Chest wall movement, Good air movement bilaterally, CTAB RRR,No Gallops,Rubs or new Murmurs, No Parasternal Heave +ve B.Sounds, Abd Soft, Non tender, No organomegaly appriciated, No rebound -guarding or rigidity. PEG site clean No Cyanosis, Clubbing or edema, No new Rash or bruise, except erythematous groin rash.  Chr L arm deformity, and contractures   Data Review   Major procedures and Radiology Reports - PLEASE review detailed and final reports for all details in brief -       Dg Abd 1 View  03/22/2012  *RADIOLOGY REPORT*  Clinical Data: Evaluate G-tube placement.  ABDOMEN - 1 VIEW   Comparison: 10/27/2011.  Findings: A single radiograph of the upper abdomen was obtained following the injection of the patient's G-tube with contrast material.  The injected contrast material is confined completely within the stomach (no extravasation noted).  Multiple surgical clips are seen inferior to the stomach.  Visualized bowel gas pattern is nonobstructive.  IMPRESSION: 1.  G-tube appears properly located.   Original Report Authenticated By: Trudie Reed, M.D.    Ct Head Wo Contrast  04/16/2012  *RADIOLOGY REPORT*  Clinical Data: Altered mental status, cerebral palsy  CT HEAD WITHOUT CONTRAST  Technique:  Contiguous axial  images were obtained from the base of the skull through the vertex without contrast.  Comparison: 04/08/2012  Findings: Stable postop changes in the anterior right frontal lobe. Diffuse brain atrophy noted with ventricular enlargement as before. No significant interval change.  No acute intracranial hemorrhage, definite mass lesion, acute infarction, focal edema, mass effect, midline shift, or extra-axial fluid collection.  Cisterns patent. Cerebellar atrophy as well.  Exam is limited because of oblique acquisition.  Chronic periventricular white matter microvascular ischemic changes noted diffusely.  Basal ganglia lacunar infarcts noted bilaterally.  IMPRESSION: Stable chronic atrophy and microvascular ischemic changes as well as postoperative findings.  No acute process by noncontrast CT or significant interval change.   Original Report Authenticated By: Judie Petit. Miles Costain, M.D.    Ct Head Wo Contrast  04/08/2012  *RADIOLOGY REPORT*  Clinical Data: Altered mental status.  CT HEAD WITHOUT CONTRAST  Technique:  Contiguous axial images were obtained from the base of the skull through the vertex without contrast.  Comparison: 07/30/2009  Findings: Postsurgical changes in the right frontal lobe.  These are stable since prior study.  Atrophy.  Associated ventriculomegaly, likely related to ex  vacuo dilatation.  No acute infarction or hemorrhage.  No extra-axial fluid collection.  No acute calvarial abnormality. Visualized paranasal sinuses and mastoids clear.  Orbital soft tissues unremarkable.  IMPRESSION: No acute intracranial abnormality.  No change since prior study.   Original Report Authenticated By: Charlett Nose, M.D.    Dg Chest Port 1 View  04/17/2012  *RADIOLOGY REPORT*  Clinical Data: Weakness, shortness of breath  PORTABLE CHEST - 1 VIEW  Comparison: Portable chest x-ray of 04/11/2012  Findings: There is still slight elevation of the right hemidiaphragm.  No infiltrate or effusion is seen.  The right PICC line is unchanged in position, with the tip overlying the mid upper SVC.  Heart size is stable.  IMPRESSION: No active infiltrate or effusion.  No change in right PICC line position.   Original Report Authenticated By: Dwyane Dee, M.D.    Dg Chest Port 1 View  04/11/2012  *RADIOLOGY REPORT*  Clinical Data: PICC line placed  PORTABLE CHEST - 1 VIEW  Comparison: Chest radiograph 04/08/2012  Findings: Interval placement of a right PICC line with tip in the distal SVC.  There is no effusion, infiltrate, or pneumothorax.  IMPRESSION: Right PICC line in good position.   Original Report Authenticated By: Genevive Bi, M.D.    Dg Chest Port 1 View  04/08/2012  *RADIOLOGY REPORT*  Clinical Data: Altered mental status, fever.  PORTABLE CHEST - 1 VIEW  Comparison: 03/02/2012  Findings: New opacity noted in the left retrocardiac region concerning for pneumonia.  Low lung volumes.  Right lung is clear. Heart is normal size.  No visible effusions or acute bony abnormality.  IMPRESSION: Developing opacity at the left lung base concerning for pneumonia.  Low lung volumes.   Original Report Authenticated By: Charlett Nose, M.D.    Dg Abd Portable 1v  04/16/2012  *RADIOLOGY REPORT*  Clinical Data: Vomiting.  PORTABLE ABDOMEN - 1 VIEW  Comparison: Abdominal radiographs dated 03/22/2012 and CT scan  dated 03/03/2012  Findings: There is minimal air in the bowel.  No dilated loops of bowel.  No visible free air on this supine radiograph.  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  IMPRESSION: Benign-appearing abdomen.   Original Report Authenticated By: Francene Boyers, M.D.     Micro Results      Recent Results (from the past 240 hour(s))  URINE CULTURE     Status: None   Collection Time    04/16/12  7:21 PM      Result Value Range Status   Specimen Description URINE, CATHETERIZED   Final   Special Requests levaquin (d/c'ed) Normal   Final   Culture  Setup Time 04/17/2012 03:02   Final   Colony Count NO GROWTH   Final   Culture NO GROWTH   Final   Report Status 04/17/2012 FINAL   Final  CULTURE, BLOOD (ROUTINE X 2)     Status: None   Collection Time    04/16/12 10:00 PM      Result Value Range Status   Specimen Description BLOOD LEFT ARM   Final   Special Requests BOTTLES DRAWN AEROBIC AND ANAEROBIC   Final   Culture  Setup Time 04/17/2012 02:54   Final   Culture     Final   Value:        BLOOD CULTURE RECEIVED NO GROWTH TO DATE CULTURE WILL BE HELD FOR 5 DAYS BEFORE ISSUING A FINAL NEGATIVE REPORT   Report Status PENDING   Incomplete  CULTURE, BLOOD (ROUTINE X 2)     Status: None   Collection Time    04/16/12 10:19 PM      Result Value Range Status   Specimen Description BLOOD LEFT   Final   Special Requests BOTTLES DRAWN AEROBIC AND ANAEROBIC   Final   Culture  Setup Time 04/17/2012 02:54   Final   Culture     Final   Value:        BLOOD CULTURE RECEIVED NO GROWTH TO DATE CULTURE WILL BE HELD FOR 5 DAYS BEFORE ISSUING A FINAL NEGATIVE REPORT   Report Status PENDING   Incomplete     CBC w Diff: Lab Results  Component Value Date   WBC 7.1 04/20/2012   HGB 10.1* 04/20/2012   HCT 32.6* 04/20/2012   PLT 356 04/20/2012   LYMPHOPCT 32 04/08/2012   MONOPCT 8 04/08/2012   EOSPCT 0 04/08/2012   BASOPCT 0 04/08/2012    CMP: Lab Results  Component Value Date   NA 137  04/20/2012   K 3.9 04/20/2012   CL 103 04/20/2012   CO2 26 04/20/2012   BUN 7 04/20/2012   CREATININE 0.47* 04/20/2012   PROT 4.8* 04/08/2012   ALBUMIN 2.6* 04/20/2012   BILITOT 0.2* 04/08/2012   ALKPHOS 44 04/08/2012   AST 18 04/08/2012   ALT 14 04/08/2012  .   Discharge Instructions     Follow with Primary MD  in 4 days   Get CBC, CMP, checked 4 days by Primary MD and again as instructed by your Primary MD. Get a Chest X ray done next visit.  Get Medicines reviewed and adjusted.  Accuchecks 4 times/day, Once in AM empty stomach and then before each meal. Log in all results and show them to your Prim.MD in 3 days. If any glucose reading is under 80 or above 300 call your Prim MD immidiately. Follow Low glucose instructions for glucose under 80 as instructed.   Please request your Prim.MD to go over all Hospital Tests and Procedure/Radiological results at the follow up, please get all Hospital records sent to your Prim MD by signing hospital release before you go home.  Activity: As tolerated with Full fall precautions use walker/cane & assistance as needed   Diet:  Tube feeds as before with water flushes via PEG tube, Keep Bed elevated  at 60 degrees while feeding and till 2hrs post feeding.  For Heart failure patients - Check your Weight same time everyday, if you gain over 2 pounds, or you develop in leg swelling, experience more shortness of breath or chest pain, call your Primary MD immediately. Follow Cardiac Low Salt Diet and 1.8 lit/day fluid restriction.  Disposition Home   If you experience worsening of your admission symptoms, develop shortness of breath, life threatening emergency, suicidal or homicidal thoughts you must seek medical attention immediately by calling 911 or calling your MD immediately  if symptoms less severe.  You Must read complete instructions/literature along with all the possible adverse reactions/side effects for all the Medicines you take and that have been  prescribed to you. Take any new Medicines after you have completely understood and accpet all the possible adverse reactions/side effects.           Follow-up Information   Follow up with PCP. Schedule an appointment as soon as possible for a visit in 3 days.      Follow up with Gates Rigg, MD. Schedule an appointment as soon as possible for a visit in 2 weeks.   Contact information:   [address], [address] [facility] [place] [address] [phone]         Discharge Medications     Medication List    TAKE these medications       amoxicillin-clavulanate 875-125 MG per tablet  Commonly known as:  AUGMENTIN  Take 1 tablet by mouth every 12 (twelve) hours.     antiseptic oral rinse Liqd  15 mLs by Mouth Rinse route 2 times daily at 12 noon and 4 pm.     clonazePAM 1 MG tablet  Commonly known as:  KLONOPIN  Place 1 mg into feeding tube 2 (two) times daily.     diphenoxylate-atropine 2.5-0.025 MG per tablet  Commonly known as:  LOMOTIL  Place 1 tablet into feeding tube 3 (three) times daily as needed. For diarrhea     feeding supplement (JEVITY 1.2 CAL) Liqd  Place 237 mLs into feeding tube 4 (four) times daily. Flush with 8oz of water after each tube feed.  1 month supply     fenofibrate 145 MG tablet  Commonly known as:  TRICOR  Place 145 mg into feeding tube at bedtime.     glucose monitoring kit monitoring kit  1 each by Does not apply route 4 (four) times daily - after meals and at bedtime. 1 month Diabetic Testing Supplies for QAC-QHS accuchecks.     HYDROcodone-acetaminophen 5-325 MG per tablet  Commonly known as:  NORCO/VICODIN  Place 1 tablet into feeding tube 3 (three) times daily as needed for pain.     hydrocortisone cream 1 %  Apply topically 3 (three) times daily. Affected skin     insulin aspart 100 UNIT/ML injection  Commonly known as:  novoLOG  Inject 0-15 Units into the skin every 4 (four) hours. Before each  meal 3 times a day, 140-199 - 2 units, 200-250 - 4 units, 251-299 - 6 units,  300-349 - 8 units,  350 or above 10 units.     insulin detemir 100 UNIT/ML injection  Commonly known as:  LEVEMIR  Inject 30 Units into the skin 2 (two) times daily.     levETIRAcetam 1000 MG tablet  Commonly known as:  KEPPRA  Take 1 tablet (1,000 mg total) by mouth 2 (two) times daily.     metFORMIN  1000 MG tablet  Commonly known as:  GLUCOPHAGE  Take 1,000 mg by mouth 2 (two) times daily with a meal.     metoprolol 100 MG tablet  Commonly known as:  LOPRESSOR  Place 1 tablet (100 mg total) into feeding tube 2 (two) times daily.     multivitamin with minerals Tabs  Place 1 tablet into feeding tube daily.     nystatin powder  Commonly known as:  MYCOSTATIN  Apply topically 2 (two) times daily. Affected groin skin     PARoxetine 40 MG tablet  Commonly known as:  PAXIL  Place 40 mg into feeding tube every morning.     phenytoin 100 MG ER capsule  Commonly known as:  DILANTIN  Take 3 capsules (300 mg total) by mouth daily.     pregabalin 150 MG capsule  Commonly known as:  LYRICA  Place 150 mg into feeding tube 2 (two) times daily.     ranitidine 300 MG tablet  Commonly known as:  ZANTAC  Place 300 mg into feeding tube at bedtime.           Total Time in preparing paper work, data evaluation and todays exam - 35 minutes  Leroy Sea M.D on 04/21/2012 at 11:10 AM  [facility] Office  [phone]

## 2012-04-22 ENCOUNTER — Emergency Department (HOSPITAL_COMMUNITY): Payer: 59

## 2012-04-22 ENCOUNTER — Encounter (HOSPITAL_COMMUNITY): Payer: Self-pay

## 2012-04-22 ENCOUNTER — Inpatient Hospital Stay (HOSPITAL_COMMUNITY)
Admission: EM | Admit: 2012-04-22 | Discharge: 2012-04-25 | DRG: 091 | Disposition: A | Discharge status: Home / Self Care | Payer: 59 | Attending: Internal Medicine | Admitting: Internal Medicine

## 2012-04-22 ENCOUNTER — Other Ambulatory Visit: Payer: Self-pay

## 2012-04-22 DIAGNOSIS — T424X5A Adverse effect of benzodiazepines, initial encounter: Secondary | ICD-10-CM | POA: Diagnosis present

## 2012-04-22 DIAGNOSIS — G20A1 Parkinson's disease without dyskinesia, without mention of fluctuations: Secondary | ICD-10-CM | POA: Diagnosis present

## 2012-04-22 DIAGNOSIS — Z87440 Personal history of urinary (tract) infections: Secondary | ICD-10-CM

## 2012-04-22 DIAGNOSIS — G809 Cerebral palsy, unspecified: Secondary | ICD-10-CM | POA: Diagnosis present

## 2012-04-22 DIAGNOSIS — E669 Obesity, unspecified: Secondary | ICD-10-CM | POA: Diagnosis present

## 2012-04-22 DIAGNOSIS — F411 Generalized anxiety disorder: Secondary | ICD-10-CM | POA: Diagnosis present

## 2012-04-22 DIAGNOSIS — E119 Type 2 diabetes mellitus without complications: Secondary | ICD-10-CM | POA: Diagnosis present

## 2012-04-22 DIAGNOSIS — R569 Unspecified convulsions: Secondary | ICD-10-CM | POA: Diagnosis present

## 2012-04-22 DIAGNOSIS — Z794 Long term (current) use of insulin: Secondary | ICD-10-CM

## 2012-04-22 DIAGNOSIS — Y92009 Unspecified place in unspecified non-institutional (private) residence as the place of occurrence of the external cause: Secondary | ICD-10-CM

## 2012-04-22 DIAGNOSIS — Z885 Allergy status to narcotic agent status: Secondary | ICD-10-CM

## 2012-04-22 DIAGNOSIS — G20 Parkinson's disease: Secondary | ICD-10-CM

## 2012-04-22 DIAGNOSIS — Z6828 Body mass index (BMI) 28.0-28.9, adult: Secondary | ICD-10-CM

## 2012-04-22 DIAGNOSIS — R1319 Other dysphagia: Secondary | ICD-10-CM | POA: Diagnosis present

## 2012-04-22 DIAGNOSIS — F329 Major depressive disorder, single episode, unspecified: Secondary | ICD-10-CM | POA: Diagnosis present

## 2012-04-22 DIAGNOSIS — G9340 Encephalopathy, unspecified: Secondary | ICD-10-CM

## 2012-04-22 DIAGNOSIS — F32A Depression, unspecified: Secondary | ICD-10-CM | POA: Diagnosis present

## 2012-04-22 DIAGNOSIS — E43 Unspecified severe protein-calorie malnutrition: Secondary | ICD-10-CM | POA: Diagnosis present

## 2012-04-22 DIAGNOSIS — Z905 Acquired absence of kidney: Secondary | ICD-10-CM

## 2012-04-22 DIAGNOSIS — Z85528 Personal history of other malignant neoplasm of kidney: Secondary | ICD-10-CM

## 2012-04-22 DIAGNOSIS — F3289 Other specified depressive episodes: Secondary | ICD-10-CM | POA: Diagnosis present

## 2012-04-22 DIAGNOSIS — G929 Unspecified toxic encephalopathy: Principal | ICD-10-CM | POA: Diagnosis present

## 2012-04-22 DIAGNOSIS — J189 Pneumonia, unspecified organism: Secondary | ICD-10-CM | POA: Diagnosis present

## 2012-04-22 DIAGNOSIS — Z792 Long term (current) use of antibiotics: Secondary | ICD-10-CM

## 2012-04-22 DIAGNOSIS — G40109 Localization-related (focal) (partial) symptomatic epilepsy and epileptic syndromes with simple partial seizures, not intractable, without status epilepticus: Secondary | ICD-10-CM | POA: Diagnosis present

## 2012-04-22 DIAGNOSIS — Z79899 Other long term (current) drug therapy: Secondary | ICD-10-CM

## 2012-04-22 DIAGNOSIS — E785 Hyperlipidemia, unspecified: Secondary | ICD-10-CM

## 2012-04-22 DIAGNOSIS — I10 Essential (primary) hypertension: Secondary | ICD-10-CM | POA: Diagnosis present

## 2012-04-22 DIAGNOSIS — S68419A Complete traumatic amputation of unspecified hand at wrist level, initial encounter: Secondary | ICD-10-CM

## 2012-04-22 DIAGNOSIS — Z931 Gastrostomy status: Secondary | ICD-10-CM

## 2012-04-22 DIAGNOSIS — B3749 Other urogenital candidiasis: Secondary | ICD-10-CM | POA: Diagnosis present

## 2012-04-22 LAB — CBC WITH DIFFERENTIAL/PLATELET
Basophils Absolute: 0 10*3/uL (ref 0.0–0.1)
HCT: 34.8 % — ABNORMAL LOW (ref 36.0–46.0)
Lymphocytes Relative: 28 % (ref 12–46)
Monocytes Absolute: 0.7 10*3/uL (ref 0.1–1.0)
Neutro Abs: 6.2 10*3/uL (ref 1.7–7.7)
Neutrophils Relative %: 63 % (ref 43–77)
Platelets: 367 10*3/uL (ref 150–400)
RDW: 15.5 % (ref 11.5–15.5)
WBC: 9.8 10*3/uL (ref 4.0–10.5)

## 2012-04-22 LAB — COMPREHENSIVE METABOLIC PANEL
ALT: 14 U/L (ref 0–35)
AST: 18 U/L (ref 0–37)
Alkaline Phosphatase: 70 U/L (ref 39–117)
CO2: 24 mEq/L (ref 19–32)
Chloride: 96 mEq/L (ref 96–112)
GFR calc non Af Amer: >90 mL/min (ref >90)
Potassium: 4.5 mEq/L (ref 3.5–5.1)
Sodium: 132 mEq/L — ABNORMAL LOW (ref 135–145)
Total Bilirubin: 0.1 mg/dL — ABNORMAL LOW (ref 0.3–1.2)

## 2012-04-22 LAB — GLUCOSE, CAPILLARY
Glucose-Capillary: 153 mg/dL — ABNORMAL HIGH (ref 70–99)
Glucose-Capillary: 109 mg/dL — ABNORMAL HIGH (ref 70–99)

## 2012-04-22 LAB — BLOOD GAS, ARTERIAL
Drawn by: 331471
O2 Content: 2 L/min
pCO2 arterial: 41 mmHg (ref 35.0–45.0)
pO2, Arterial: 107 mmHg — ABNORMAL HIGH (ref 80.0–100.0)

## 2012-04-22 LAB — URINALYSIS, ROUTINE W REFLEX MICROSCOPIC
Bilirubin Urine: NEGATIVE
Ketones, ur: NEGATIVE mg/dL
Nitrite: NEGATIVE
Urobilinogen, UA: 0.2 mg/dL (ref 0.0–1.0)
pH: 6 (ref 5.0–8.0)

## 2012-04-22 LAB — MRSA PCR SCREENING: MRSA by PCR: NEGATIVE

## 2012-04-22 LAB — URINE MICROSCOPIC-ADD ON

## 2012-04-22 LAB — PHENYTOIN LEVEL, TOTAL: Phenytoin Lvl: <2.5 ug/mL — ABNORMAL LOW (ref 10.0–20.0)

## 2012-04-22 LAB — LACTIC ACID, PLASMA: Lactic Acid, Venous: 1.6 mmol/L (ref 0.5–2.2)

## 2012-04-22 MED ORDER — PREGABALIN 50 MG PO CAPS: 150.0000 mg | ORAL_CAPSULE | Freq: Two times a day (BID) | ORAL | Status: DC

## 2012-04-22 MED ORDER — ADULT MULTIVITAMIN LIQUID CH
5.0000 mL | Freq: Every day | ORAL | Status: DC
  Administered 2012-04-22 – 2012-04-25 (×4): 5 mL
  Filled 2012-04-22 (×4): qty 5

## 2012-04-22 MED ORDER — SODIUM CHLORIDE 0.9 % IV SOLN
INTRAVENOUS | Status: AC
  Administered 2012-04-22: 15:00:00 via INTRAVENOUS

## 2012-04-22 MED ORDER — NYSTATIN 100000 UNIT/GM EX POWD: Freq: Two times a day (BID) | CUTANEOUS | Status: DC

## 2012-04-22 MED ORDER — ONDANSETRON HCL 4 MG/2ML IJ SOLN: 4.0000 mg | Freq: Three times a day (TID) | INTRAMUSCULAR | Status: AC | PRN

## 2012-04-22 MED ORDER — FENOFIBRATE 160 MG PO TABS
160.0000 mg | ORAL_TABLET | Freq: Every day | ORAL | Status: DC
  Administered 2012-04-22 – 2012-04-25 (×4): 160 mg via ORAL
  Filled 2012-04-22 (×4): qty 1

## 2012-04-22 MED ORDER — AMOXICILLIN-POT CLAVULANATE 400-57 MG/5ML PO SUSR
800.0000 mg | Freq: Two times a day (BID) | ORAL | Status: DC
Start: 2012-04-22 — End: 2012-04-22
  Filled 2012-04-22: qty 10

## 2012-04-22 MED ORDER — INSULIN ASPART 100 UNIT/ML ~~LOC~~ SOLN
0.0000 [IU] | Freq: Three times a day (TID) | SUBCUTANEOUS | Status: DC
  Administered 2012-04-23 (×2): 3 [IU] via SUBCUTANEOUS
  Administered 2012-04-24 (×2): 2 [IU] via SUBCUTANEOUS
  Administered 2012-04-25: 3 [IU] via SUBCUTANEOUS
  Administered 2012-04-25: 5 [IU] via SUBCUTANEOUS
  Administered 2012-04-25: 2 [IU] via SUBCUTANEOUS

## 2012-04-22 MED ORDER — DIPHENOXYLATE-ATROPINE 2.5-0.025 MG PO TABS
1.0000 | ORAL_TABLET | Freq: Three times a day (TID) | ORAL | Status: DC | PRN
  Administered 2012-04-23: 1
  Filled 2012-04-22: qty 1

## 2012-04-22 MED ORDER — METFORMIN HCL 500 MG PO TABS
1000.0000 mg | ORAL_TABLET | Freq: Two times a day (BID) | ORAL | Status: DC
  Administered 2012-04-23 – 2012-04-25 (×5): 1000 mg via ORAL
  Filled 2012-04-22 (×7): qty 2

## 2012-04-22 MED ORDER — METOPROLOL TARTRATE 25 MG/10 ML ORAL SUSPENSION
100.0000 mg | Freq: Two times a day (BID) | ORAL | Status: DC
  Administered 2012-04-22 – 2012-04-25 (×6): 100 mg
  Filled 2012-04-22 (×8): qty 40

## 2012-04-22 MED ORDER — NALOXONE HCL 0.4 MG/ML IJ SOLN
0.4000 mg | Freq: Once | INTRAMUSCULAR | Status: AC
  Administered 2012-04-22: 0.4 mg via INTRAVENOUS
  Filled 2012-04-22: qty 1

## 2012-04-22 MED ORDER — NYSTATIN 100000 UNIT/GM EX POWD
Freq: Two times a day (BID) | CUTANEOUS | Status: DC
  Administered 2012-04-22 – 2012-04-23 (×2): via TOPICAL
  Administered 2012-04-23: 1 via TOPICAL
  Administered 2012-04-24 – 2012-04-25 (×3): via TOPICAL
  Filled 2012-04-22: qty 15

## 2012-04-22 MED ORDER — PHENYTOIN SODIUM EXTENDED 100 MG PO CAPS
300.0000 mg | ORAL_CAPSULE | Freq: Every day | ORAL | Status: DC
  Filled 2012-04-22: qty 3

## 2012-04-22 MED ORDER — SODIUM CHLORIDE 0.9 % IJ SOLN
3.0000 mL | Freq: Two times a day (BID) | INTRAMUSCULAR | Status: DC
Start: 2012-04-22 — End: 2012-04-25
  Administered 2012-04-23: 30 mL via INTRAVENOUS
  Administered 2012-04-24: 3 mL via INTRAVENOUS

## 2012-04-22 MED ORDER — PAROXETINE HCL 20 MG PO TABS
40.0000 mg | ORAL_TABLET | Freq: Every morning | ORAL | Status: DC
  Administered 2012-04-23 – 2012-04-25 (×3): 40 mg
  Filled 2012-04-22 (×3): qty 2

## 2012-04-22 MED ORDER — BIOTENE DRY MOUTH MT LIQD
15.0000 mL | Freq: Two times a day (BID) | OROMUCOSAL | Status: DC
  Administered 2012-04-22 – 2012-04-25 (×7): 15 mL via OROMUCOSAL

## 2012-04-22 MED ORDER — INSULIN DETEMIR 100 UNIT/ML ~~LOC~~ SOLN
30.0000 [IU] | Freq: Two times a day (BID) | SUBCUTANEOUS | Status: DC
  Administered 2012-04-22 – 2012-04-25 (×6): 30 [IU] via SUBCUTANEOUS
  Filled 2012-04-22 (×5): qty 10

## 2012-04-22 MED ORDER — ENOXAPARIN SODIUM 40 MG/0.4ML ~~LOC~~ SOLN
40.0000 mg | SUBCUTANEOUS | Status: DC
  Administered 2012-04-22 – 2012-04-25 (×4): 40 mg via SUBCUTANEOUS
  Filled 2012-04-22 (×4): qty 0.4

## 2012-04-22 MED ORDER — METFORMIN HCL 500 MG PO TABS: 1000.0000 mg | ORAL_TABLET | Freq: Two times a day (BID) | ORAL | Status: DC

## 2012-04-22 MED ORDER — PHENYTOIN 125 MG/5ML PO SUSP
100.0000 mg | Freq: Three times a day (TID) | ORAL | Status: DC
  Administered 2012-04-22 – 2012-04-25 (×9): 100 mg via ORAL
  Filled 2012-04-22 (×12): qty 4

## 2012-04-22 MED ORDER — AMOXICILLIN-POT CLAVULANATE 400-57 MG/5ML PO SUSR
800.0000 mg | Freq: Two times a day (BID) | ORAL | Status: DC
  Administered 2012-04-22 – 2012-04-23 (×2): 800 mg
  Filled 2012-04-22 (×3): qty 10

## 2012-04-22 MED ORDER — FAMOTIDINE 40 MG/5ML PO SUSR
20.0000 mg | Freq: Every day | ORAL | Status: DC
  Filled 2012-04-22: qty 2.5

## 2012-04-22 MED ORDER — AMOXICILLIN-POT CLAVULANATE 875-125 MG PO TABS
1.0000 | ORAL_TABLET | Freq: Two times a day (BID) | ORAL | Status: DC
  Filled 2012-04-22: qty 1

## 2012-04-22 MED ORDER — FAMOTIDINE 20 MG PO TABS: 20.0000 mg | ORAL_TABLET | Freq: Every day | ORAL | Status: DC

## 2012-04-22 MED ORDER — LEVETIRACETAM 100 MG/ML PO SOLN
1000.0000 mg | Freq: Two times a day (BID) | ORAL | Status: DC
  Administered 2012-04-22 – 2012-04-25 (×6): 1000 mg
  Filled 2012-04-22 (×8): qty 10

## 2012-04-22 MED ORDER — FAMOTIDINE 20 MG PO TABS
20.0000 mg | ORAL_TABLET | Freq: Every day | ORAL | Status: DC
  Administered 2012-04-22 – 2012-04-25 (×4): 20 mg via ORAL
  Filled 2012-04-22 (×4): qty 1

## 2012-04-22 MED ORDER — SODIUM CHLORIDE 0.9 % IV SOLN
INTRAVENOUS | Status: DC
  Administered 2012-04-22 – 2012-04-25 (×3): via INTRAVENOUS

## 2012-04-22 MED ORDER — LEVETIRACETAM 500 MG PO TABS: 1000.0000 mg | ORAL_TABLET | Freq: Two times a day (BID) | ORAL | Status: DC

## 2012-04-22 MED ORDER — PREGABALIN 50 MG PO CAPS
150.0000 mg | ORAL_CAPSULE | Freq: Two times a day (BID) | ORAL | Status: DC
  Administered 2012-04-22 – 2012-04-25 (×6): 150 mg
  Filled 2012-04-22 (×4): qty 3
  Filled 2012-04-22: qty 1
  Filled 2012-04-22: qty 2
  Filled 2012-04-22: qty 3

## 2012-04-22 MED ORDER — METOPROLOL TARTRATE 100 MG PO TABS
100.0000 mg | ORAL_TABLET | Freq: Two times a day (BID) | ORAL | Status: DC
  Filled 2012-04-22: qty 1

## 2012-04-22 MED ORDER — ADULT MULTIVITAMIN W/MINERALS CH
1.0000 | ORAL_TABLET | Freq: Every day | ORAL | Status: DC
  Filled 2012-04-22: qty 1

## 2012-04-22 MED ORDER — JEVITY 1.2 CAL PO LIQD
237.0000 mL | Freq: Four times a day (QID) | ORAL | Status: DC
  Administered 2012-04-22 – 2012-04-23 (×3): 237 mL
  Filled 2012-04-22 (×9): qty 237

## 2012-04-22 NOTE — ED Notes (Signed)
Patient also has a rash in her  Groin area extending to her buttocks- nystatin powder applied from her own supply.

## 2012-04-22 NOTE — ED Notes (Signed)
Patient responsive to pain by verbalization but then closes eyes again.

## 2012-04-22 NOTE — ED Notes (Signed)
Patient denies pain.

## 2012-04-22 NOTE — H&P (Signed)
[facility] History and Physical  [name]:[id number] DOB: 01-Feb-1941 DOA: 04/22/2012  Referring physician: Dr. Manus Gunning PCP: No primary provider on file.   Chief Complaint: Brought in by her husband for mental status  HPI:  History of from patient's husband at bedside. 72 year old female with history of cerebral palsy, Parkinson's disease, type 2 diabetes mellitus, renal cancer, dysphagia with history of recurrent aspiration pneumonia, status post PEG tube placed for glucose admitted recently for almost 2 weeks portal mental status with Hospital course prolonged with health care associated pneumonia and focal seizures. She was started on Dilantin and Keppra evaluated by neurology. She was discharged home with home services Only yesterday. As per husband at bedside he gave agent her medication this morning and shortly thereafter she was poorly responsive and very lethargic. EMS arrived and was found to be responding to sternal rubs and brought to the hospital.  Course in the ED Patient's vitals were stable however was found to be very lethargic. Blood work done showed a normal CBC, BMET, normal arterial blood gas and a normal head CT. She was given a dose of Narcan. [facility] called admit patient for altered mental status. On my evaluation patient was quite sleepy and lethargic however was awake to physical stimuli and answering questions appropriately. There has been no changes in her medications at discharge yesterday. Denies any fevers, chills, nausea, vomiting, and chest pain, palpitations, shortness of breath, abdominal pain, bowel or urinary symptoms.  Review of Systems: (Provided primarily by the husband ) Patient unable to provide history because of altered mental status and baseline mental retardation Denies fever, chills, dry nose, chest pain, palpitation, shortness of breath, wheezing, cough, nausea, vomiting, diarrhea, at baseline she is incontinent of  bowel and bladder.   Past Medical History  Diagnosis Date  . Diabetes mellitus   . Renal disorder   . CP (cerebral palsy)   . Parkinsonism   . Dysphagia   . Recurrent aspiration pneumonia   . Bladder spasms   . Amputation of arm   . Recurrent UTI   . Renal cancer     s/p nephrectomy   Past Surgical History  Procedure Laterality Date  . 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 Peg tube placement    . Knee surgery    . Nephrectomy     Social History:  reports that she has never smoked. She has never used smokeless tobacco. She reports that she does not drink alcohol or use illicit drugs.  Allergies  Allergen Reactions  . Codeine Nausea And Vomiting  . Morphine And Related Nausea And Vomiting    No family history on file.  Prior to Admission medications   Medication Sig Start Date End Date Taking? Authorizing Provider  amoxicillin-clavulanate (AUGMENTIN) 875-125 MG per tablet Take 1 tablet by mouth every 12 (twelve) hours. 04/21/12   Leroy Sea, MD  antiseptic oral rinse (BIOTENE) LIQD 15 mLs by Mouth Rinse route 2 times daily at 12 noon and 4 pm. 04/21/12   Leroy Sea, MD  clonazePAM (KLONOPIN) 1 MG tablet Place 1 mg into feeding tube 2 (two) times daily.     Historical Provider, MD  diphenoxylate-atropine (LOMOTIL) 2.5-0.025 MG per tablet Place 1 tablet into feeding tube 3 (three) times daily as needed. For diarrhea    Historical Provider, MD  fenofibrate (TRICOR) 145 MG tablet Place 145 mg into feeding tube at bedtime.     Historical  Provider, MD  glucose monitoring kit (FREESTYLE) monitoring kit 1 each by Does not apply route 4 (four) times daily - after meals and at bedtime. 1 month Diabetic Testing Supplies for QAC-QHS accuchecks. 04/21/12   Leroy Sea, MD  HYDROcodone-acetaminophen (NORCO/VICODIN) 5-325 MG per tablet Place 1 tablet into feeding tube 3 (three) times daily as needed for pain.    Historical Provider, MD  hydrocortisone cream 1 %  Apply topically 3 (three) times daily. Affected skin 04/21/12   Leroy Sea, MD  insulin aspart (NOVOLOG) 100 UNIT/ML injection Inject 0-15 Units into the skin every 4 (four) hours. Before each meal 3 times a day, 140-199 - 2 units, 200-250 - 4 units, 251-299 - 6 units,  300-349 - 8 units,  350 or above 10 units. 04/21/12   Leroy Sea, MD  insulin detemir (LEVEMIR) 100 UNIT/ML injection Inject 30 Units into the skin 2 (two) times daily. 04/21/12   Leroy Sea, MD  levETIRAcetam (KEPPRA) 1000 MG tablet Take 1 tablet (1,000 mg total) by mouth 2 (two) times daily. 04/21/12   Leroy Sea, MD  metFORMIN (GLUCOPHAGE) 1000 MG tablet Take 1,000 mg by mouth 2 (two) times daily with a meal.     Historical Provider, MD  metoprolol tartrate (LOPRESSOR) 100 MG tablet Place 1 tablet (100 mg total) into feeding tube 2 (two) times daily. 04/21/12   Leroy Sea, MD  Multiple Vitamin (MULTIVITAMIN WITH MINERALS) TABS Place 1 tablet into feeding tube daily. 04/21/12   Leroy Sea, MD  Nutritional Supplements (FEEDING SUPPLEMENT, JEVITY 1.2 CAL,) LIQD Place 237 mLs into feeding tube 4 (four) times daily. Flush with 8oz of water after each tube feed. 1 month supply 04/21/12   Leroy Sea, MD  nystatin (MYCOSTATIN) powder Apply topically 2 (two) times daily. Affected groin skin 04/21/12   Leroy Sea, MD  PARoxetine (PAXIL) 40 MG tablet Place 40 mg into feeding tube every morning.     Historical Provider, MD  phenytoin (DILANTIN) 100 MG ER capsule Take 3 capsules (300 mg total) by mouth daily. 04/21/12   Leroy Sea, MD  pregabalin (LYRICA) 150 MG capsule Place 150 mg into feeding tube 2 (two) times daily.     Historical Provider, MD  ranitidine (ZANTAC) 300 MG tablet Place 300 mg into feeding tube at bedtime.     Historical Provider, MD    Physical Exam:  Filed Vitals:   04/22/12 1330 04/22/12 1336 04/22/12 1400 04/22/12 1405  BP: 120/48  122/43   Pulse: 80  74   Temp:  98.9 F  (37.2 C)  97.8 F (36.6 C)  TempSrc:  Rectal  Rectal  Resp: 15  17   SpO2: 100%  100%     Constitutional: Vital signs reviewed.  Patient is an elderly frail female lying in bed sleepy arousable to physical stimuli and well oriented and communicating Head: Normocephalic and atraumatic Ear: TM normal bilaterally Mouth: no erythema or exudates, MMM Eyes: PERRL, EOMI, conjunctivae normal, No scleral icterus.  Neck: Supple, Trachea midline normal ROM, No JVD, mass, thyromegaly, or carotid bruit present.  Cardiovascular: RRR, S1 normal, S2 normal, no MRG, pulses symmetric and intact bilaterally Pulmonary/Chest: CTAB, no wheezes, rales, or rhonchi Abdominal: Soft. PEG tube in place. Non-tender, non-distended, bowel sounds are normal, no masses, organomegaly, or guarding present.  GU: no CVA tenderness, chronic Foley.  Musculoskeletal: No joint deformities, erythema, or stiffness, ROM full and no nontender Ext: no edema and no  cyanosis, pulses palpable bilaterally (DP and PT) Hematology: no cervical, inginal, or axillary adenopathy.  Neurological: Patient is sleepy but arousable to physical stimuli, oriented to conversation. Cranial nerves intact. Unable to move her extremities. Contractures over left hand. Skin: Warm, dry and intact. No rash, cyanosis, or clubbing.  Psychiatric: Normal mood and affect. speech and behavior is normal. Judgment and thought content normal. Cognition and memory are normal.   Labs on Admission:  Basic Metabolic Panel:  Recent Labs Lab 04/16/12 0630  04/17/12 0350 04/18/12 0430 04/18/12 1540 04/19/12 0500 04/20/12 0450 04/22/12 1309  NA 141  < > 136 138  --  134* 137 132*  K 3.3*  < > 3.5 3.0* 3.9 3.5 3.9 4.5  CL 102  < > 101 103  --  100 103 96  CO2 25  < > 25 23  --  24 26 24   GLUCOSE 82  < > 130* 163*  --  238* 250* 204*  BUN 9  < > 12 9  --  6 7 18   CREATININE 0.38*  < > 0.50 0.42*  --  0.39* 0.47* 0.57  CALCIUM 9.2  < > 8.5 8.5  --  9.0 9.2 9.4   MG 1.6  --  1.7 1.7 1.9 1.7 2.1  --   PHOS 2.7  --  3.0 2.8  --  2.7 4.0  --   < > = values in this interval not displayed. Liver Function Tests:  Recent Labs Lab 04/17/12 0350 04/18/12 0430 04/19/12 0500 04/20/12 0450 04/22/12 1309  AST  --   --   --   --  18  ALT  --   --   --   --  14  ALKPHOS  --   --   --   --  70  BILITOT  --   --   --   --  0.1*  PROT  --   --   --   --  6.7  ALBUMIN 2.7*  2.7* 2.6* 2.8* 2.6* 2.7*    Recent Labs Lab 04/22/12 1309  LIPASE 38   No results found for this basename: AMMONIA,  in the last 168 hours CBC:  Recent Labs Lab 04/17/12 0350 04/18/12 0430 04/19/12 0500 04/20/12 0450 04/22/12 1309  WBC 9.1 8.2 10.4 7.1 9.8  NEUTROABS  --   --   --   --  6.2  HGB 9.8* 10.3* 10.9* 10.1* 10.8*  HCT 30.3* 32.3* 33.9* 32.6* 34.8*  MCV 88.9 90.0 88.7 91.1 92.1  PLT 334 321 350 356 367   Cardiac Enzymes:  Recent Labs Lab 04/16/12 0905 04/16/12 1500 04/16/12 2200 04/22/12 1309  TROPONINI <0.30 <0.30 <0.30 <0.30   BNP: No components found with this basename: POCBNP,  CBG:  Recent Labs Lab 04/21/12 0102 04/21/12 0428 04/21/12 0747 04/21/12 1147 04/22/12 1442  GLUCAP 196* 207* 207* 236* 153*    Radiological Exams on Admission: Ct Head Wo Contrast  04/22/2012  *RADIOLOGY REPORT*  Clinical Data: 72 year old female with altered mental status and decreased level of consciousness.  CT HEAD WITHOUT CONTRAST  Technique:  Contiguous axial images were obtained from the base of the skull through the vertex without contrast.  Comparison: 04/16/2012 and prior head CTs dating back to 05/31/2005.  Findings: Chronic small vessel white matter ischemic changes are again identified. Remote lacunar infarcts are present within bilateral basal ganglia, right thalamus and mid brain. No acute intracranial abnormalities are identified, including mass lesion or mass  effect, hydrocephalus, extra-axial fluid collection, midline shift, hemorrhage, or acute  infarction.  The visualized bony calvarium is unremarkable except for surgical changes of the right frontal region.  IMPRESSION: No evidence of acute intracranial abnormality.  Chronic small vessel white matter ischemic changes and remote infarcts as described.   Original Report Authenticated By: Harmon Pier, M.D.    Dg Chest Portable 1 View  04/22/2012  *RADIOLOGY REPORT*  Clinical Data: Altered mental status  PORTABLE CHEST - 1 VIEW  Comparison: 04/16/2012.  Findings: Low volume chest.  No gross consolidation. Cardiopericardial silhouette and mediastinal contours within normal limits allowing for volumes. Monitoring leads are projected over the chest.  IMPRESSION: Low volume chest.   Original Report Authenticated By: Andreas Newport, M.D.       Assessment/Plan Principal Problem:   Encephalopathy Admit to inpatient telemetry No clear cause of her encephalopathy. Patient was discharged only yesterday after a prolonged hospitalization for pneumonia and focal seizures. No seizure like activity noticed. Labs unremarkable. Chest x-ray and head CT unremarkable as well. Arterial blood gas normal. Patient afebrile. She is quite sleepy however is awake on physical stimuli and answers questions appropriately. -Dilantin level sent and will follow. -Patient is on Vicodin and Klonopin at home which I will  discontinued. -Continue neurochecks and avoid sedatives  Active Problems: Focal seizures Patient was recently started on Keppra and Dilantin. Follow Dilantin level. Will resume her Keppra and Dilantin. Since she was only discharged yesterday doubt symptoms are related to medication toxicity.      Anxiety and depression Resume medications.    Diabetes mellitus  continue sliding scale insulin and dose of Lantus.    HCAP (healthcare-associated pneumonia) Patient treated with a long course of antibiotic in the hospital and was discharged on Augmentin. Remains afebrile I will continue oral Augmentin     Protein-calorie malnutrition, severe Continue nutritional supplements    HTN (hypertension) Her pressure stable Continue home medications   Cerebral palsy and Parkinson's disease Seems at baseline. Continue medications    Code Status: full code Family Communication: Husband at bedside Disposition Plan: Inpatient telemetry. Likely need skilled nursing facility. Will discuss with social work.  Eddie North [facility] Pager [phone]  If 7PM-7AM, please contact night-coverage [email or web address] Password [email or web address] 04/22/2012, 3:52 PM     Total time spent on admission: 70 minutes

## 2012-04-22 NOTE — ED Notes (Signed)
Husband called EMS because patient had decreased responsiveness. Patient was actually unresponsive to husband. EMS gave sternal rub, responded to rub by opening eyes and turning head and closed eyes again. Patient had change in seizxure medications possibly on the 14th of March after her release from the hospital due to pnuemonia.

## 2012-04-22 NOTE — ED Provider Notes (Signed)
History     CSN: [id number]  Arrival date & time 04/22/12  1232   First MD Initiated Contact with Patient 04/22/12 1244      Chief Complaint  Patient presents with  . Altered Mental Status    (Consider location/radiation/quality/duration/timing/severity/associated sxs/prior treatment) HPI Comments: Patient presents to the ED with change in mental status that happened today. Family decided that she was less responsive than usual hard to arouse. On EMS arrival she was arousable to sternal rub and woke up to verbal stimuli. Patient is oriented x2 states her name. She denies any pain. She was discharged yesterday after prolonged hospitalization for pneumonia and seizures. No reported fever. No bowel and bladder incontinence a Foley catheter in place.  The history is provided by the patient and the EMS personnel. The history is limited by the condition of the patient.    Past Medical History  Diagnosis Date  . Diabetes mellitus   . Renal disorder   . CP (cerebral palsy)   . Parkinsonism   . Dysphagia   . Recurrent aspiration pneumonia   . Bladder spasms   . Amputation of arm   . Recurrent UTI   . Renal cancer     s/p nephrectomy    Past Surgical History  Procedure Laterality Date  . 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 Peg tube placement    . Knee surgery    . Nephrectomy      No family history on file.  History  Substance Use Topics  . Smoking status: Never Smoker   . Smokeless tobacco: Never Used  . Alcohol Use: No    OB History   Grav Para Term Preterm Abortions TAB SAB Ect Mult Living                  Review of Systems  Unable to perform ROS: Mental status change  Psychiatric/Behavioral: Positive for altered mental status.    Allergies  Codeine and Morphine and related  Home Medications   No current outpatient prescriptions on file.  BP 122/43  Pulse 74  Temp(Src) 97.8 F (36.6 C) (Rectal)  Resp 17  SpO2 100%  Physical Exam   Constitutional: She appears well-developed and well-nourished. No distress.  Chronically ill-appearing  HENT:  Head: Normocephalic and atraumatic.  Mouth/Throat: Oropharynx is clear and moist. No oropharyngeal exudate.  Eyes: EOM are normal. Pupils are equal, round, and reactive to light.  Neck: Normal range of motion. Neck supple.  Cardiovascular: Normal rate, regular rhythm and normal heart sounds.   Pulmonary/Chest: Effort normal and breath sounds normal. No respiratory distress. She exhibits no tenderness.  Abdominal: Soft. There is no tenderness. There is no rebound and no guarding.  PEG tube in place  Musculoskeletal:  Left hand amputation  Neurological: She is alert. No cranial nerve deficit. She exhibits normal muscle tone. Coordination normal.  Decreased strength in lower extremities consistent with CP  Skin: Skin is warm. Rash noted.  Erythematous rash to groin    ED Course  Procedures (including critical care time)  Labs Reviewed  URINALYSIS, ROUTINE W REFLEX MICROSCOPIC - Abnormal; Notable for the following:    APPearance TURBID (*)    Glucose, UA 100 (*)    Hgb urine dipstick SMALL (*)    Protein, ur 30 (*)    Leukocytes, UA MODERATE (*)    All other components within normal limits  CBC WITH DIFFERENTIAL - Abnormal; Notable for the  following:    RBC 3.78 (*)    Hemoglobin 10.8 (*)    HCT 34.8 (*)    All other components within normal limits  COMPREHENSIVE METABOLIC PANEL - Abnormal; Notable for the following:    Sodium 132 (*)    Glucose, Bld 204 (*)    Albumin 2.7 (*)    Total Bilirubin 0.1 (*)    All other components within normal limits  PHENYTOIN LEVEL, TOTAL - Abnormal; Notable for the following:    Phenytoin Lvl <2.5 (*)    All other components within normal limits  BLOOD GAS, ARTERIAL - Abnormal; Notable for the following:    pO2, Arterial 107.0 (*)    Bicarbonate 25.0 (*)    All other components within normal limits  GLUCOSE, CAPILLARY -  Abnormal; Notable for the following:    Glucose-Capillary 153 (*)    All other components within normal limits  GLUCOSE, CAPILLARY - Abnormal; Notable for the following:    Glucose-Capillary 109 (*)    All other components within normal limits  MRSA PCR SCREENING  CULTURE, BLOOD (ROUTINE X 2)  CULTURE, BLOOD (ROUTINE X 2)  URINE CULTURE  LACTIC ACID, PLASMA  PROTIME-INR  LIPASE, BLOOD  TROPONIN I  URINE MICROSCOPIC-ADD ON   Ct Head Wo Contrast  04/22/2012  *RADIOLOGY REPORT*  Clinical Data: 72 year old female with altered mental status and decreased level of consciousness.  CT HEAD WITHOUT CONTRAST  Technique:  Contiguous axial images were obtained from the base of the skull through the vertex without contrast.  Comparison: 04/16/2012 and prior head CTs dating back to 05/31/2005.  Findings: Chronic small vessel white matter ischemic changes are again identified. Remote lacunar infarcts are present within bilateral basal ganglia, right thalamus and mid brain. No acute intracranial abnormalities are identified, including mass lesion or mass effect, hydrocephalus, extra-axial fluid collection, midline shift, hemorrhage, or acute infarction.  The visualized bony calvarium is unremarkable except for surgical changes of the right frontal region.  IMPRESSION: No evidence of acute intracranial abnormality.  Chronic small vessel white matter ischemic changes and remote infarcts as described.   Original Report Authenticated By: Harmon Pier, M.D.    Dg Chest Portable 1 View  04/22/2012  *RADIOLOGY REPORT*  Clinical Data: Altered mental status  PORTABLE CHEST - 1 VIEW  Comparison: 04/16/2012.  Findings: Low volume chest.  No gross consolidation. Cardiopericardial silhouette and mediastinal contours within normal limits allowing for volumes. Monitoring leads are projected over the chest.  IMPRESSION: Low volume chest.   Original Report Authenticated By: Andreas Newport, M.D.      1. Encephalopathy   2.  Diabetes mellitus   3. HCAP (healthcare-associated pneumonia)       MDM  Complex patient discharged yesterday after a prolonged hospitalization for pneumonia and new onset seizures. Finally less responsive and more sleepy today. No history of fevers.  Patient remains encephalopathic but protecting her airway. She arouses to pain. She is oriented to herself. Uncertain cause encephalopathy. Recent medications include Dilantin and Keppra which are new. Dilantin level pending. No evidence of recurrent pneumonia or UTI. CT head unremarkable.  No CO2 retention on ABG. No response to narcan. Dilantin level undetectable. Uncertain cause of encephalopathy. Possible delirium v med effect. No history of seizure. Remains obtunded but arousable, protecting airway.   Date: 04/22/2012  Rate: 75  Rhythm: normal sinus rhythm  QRS Axis: normal  Intervals: normal  ST/T Wave abnormalities: normal  Conduction Disutrbances:none  Narrative Interpretation:   Old EKG  Reviewed: unchanged  CRITICAL CARE Performed by: Glynn Octave   Total critical care time: 30  Critical care time was exclusive of separately billable procedures and treating other patients.  Critical care was necessary to treat or prevent imminent or life-threatening deterioration.  Critical care was time spent personally by me on the following activities: development of treatment plan with patient and/or surrogate as well as nursing, discussions with consultants, evaluation of patient's response to treatment, examination of patient, obtaining history from patient or surrogate, ordering and performing treatments and interventions, ordering and review of laboratory studies, ordering and review of radiographic studies, pulse oximetry and re-evaluation of patient's condition.      Glynn Octave, MD 04/22/12 [id number]

## 2012-04-23 DIAGNOSIS — F341 Dysthymic disorder: Secondary | ICD-10-CM

## 2012-04-23 DIAGNOSIS — R569 Unspecified convulsions: Secondary | ICD-10-CM

## 2012-04-23 LAB — CBC
MCHC: 31.1 g/dL (ref 30.0–36.0)
MCV: 91.6 fL (ref 78.0–100.0)
Platelets: 318 10*3/uL (ref 150–400)
RDW: 15 % (ref 11.5–15.5)
WBC: 7.2 10*3/uL (ref 4.0–10.5)

## 2012-04-23 LAB — CULTURE, BLOOD (ROUTINE X 2)
Culture: NO GROWTH
Culture: NO GROWTH

## 2012-04-23 LAB — BASIC METABOLIC PANEL
BUN: 16 mg/dL (ref 6–23)
CO2: 25 mEq/L (ref 19–32)
Calcium: 9.2 mg/dL (ref 8.4–10.5)
Creatinine, Ser: 0.5 mg/dL (ref 0.50–1.10)

## 2012-04-23 LAB — GLUCOSE, CAPILLARY: Glucose-Capillary: 194 mg/dL — ABNORMAL HIGH (ref 70–99)

## 2012-04-23 MED ORDER — GLUCERNA 1.2 CAL PO LIQD
237.0000 mL | Freq: Every day | ORAL | Status: DC
  Administered 2012-04-23 – 2012-04-25 (×10): 237 mL
  Filled 2012-04-23 (×14): qty 237

## 2012-04-23 MED ORDER — FREE WATER
30.0000 mL | Freq: Every day | Status: DC
  Administered 2012-04-23 – 2012-04-25 (×11): 30 mL

## 2012-04-23 NOTE — Progress Notes (Addendum)
[facility] PROGRESS NOTE  AISA SCHOEPPNER ZOX:[id number] DOB: [birth year] DOA: 04/22/2012 PCP: No primary provider on file.  Assessment/Plan  Somnolence/encephalopathy:  DDx includes incorrect medication administration, side effect of medication administered correctly, seizure in the setting of subtherapeutic phenytoin, illicit drug use.  Patient back to baseline mentation.   -  Urinalysis with yeast, however, this is not a likely cause of unresponsiveness -  On treatment for aspiration pneumonia -  UDS -  Continue previous dose of phenytoin and repeat phenytoin level tomorrow -  Repeat EEG -  ** patient has had 3 head CTs in the last 2.5 weeks**  Aspiration pneumonia, day 16 of antibiotics  -  D/c Augmentin and monitor for signs of decompensation  Focal seizures  -  Continue home Keppra and Dilantin (previously therapeutic) -  Repeat phenytoin level in AM  -  EEG   Anxiety and depression, stable Continue home medications.   Diabetes mellitus  continue sliding scale insulin and dose of Lantus.   Protein-calorie malnutrition, severe.   Continue nutritional supplements -  Appreciate nutrition recommendations and agree with changing tube feeds  HTN (hypertension) BP stable.   Continue home medications   Cerebral palsy and Parkinson's disease  Seems at baseline. Continue medications   Yeast on urinalysis while on antibiotics, asymptomatic -  D/c foley -  D/c antibiotics -  Repeat UA in a few days to verify that yeast has resolved  Diet:  Glucerna 1.2 5X/day Access:  PIV IVF:  NS at 73ml/h -- adjust FW boluses when discontinued Proph:  lovenox  Code Status: full code Family Communication: spoke with patient alone.  Attempted to call husband, but was unavailable at this time.   Disposition Plan: pending EEG, repeat phenytoin level, UDS and monitoring for more events   Consultants:  none  Procedures:  CT head  Antibiotics:  Augmentin OFF    HPI/Subjective:  Denies fevers, chills, headache.  Denies chest pain and shortness of breath.  Denies nausea, vomiting, diarrhea.  Denies constipation.     Objective: Filed Vitals:   04/22/12 1405 04/22/12 2200 04/23/12 0600 04/23/12 1332  BP:  148/67 112/67 104/63  Pulse:  111 77 76  Temp: 97.8 F (36.6 C) 99.3 F (37.4 C) 97.9 F (36.6 C) 99.5 F (37.5 C)  TempSrc: Rectal Oral Oral Oral  Resp:  20 18 18   SpO2:  98% 99% 100%    Intake/Output Summary (Last 24 hours) at 04/23/12 1521 Last data filed at 04/23/12 1100  Gross per 24 hour  Intake      0 ml  Output    250 ml  Net   -250 ml   There were no vitals filed for this visit.  Exam:   General:  Obese F, no acute distress, barely able to turn head from where it is positioned facing right.  HEENT:  NCAT, MMM  Cardiovascular:  RRR, nl S1, S2 no mrg, 2+ pulses, warm extremities  Respiratory:  CTAB, no increased WOB  Abdomen:   NABS, soft, NT/ND.  G-tube site c/d/i  MSK:   Normal tone and bulk, no LEE  Neuro:  1/5 strength bilateral legs, 3/5 upper extremities.  Alert and able to answer questions appropriately.    Data Reviewed: Basic Metabolic Panel:  Recent Labs Lab 04/17/12 0350 04/18/12 0430 04/18/12 1540 04/19/12 0500 04/20/12 0450 04/22/12 1309 04/23/12 0745  NA 136 138  --  134* 137 132* 136  K 3.5 3.0* 3.9 3.5 3.9 4.5 4.3  CL 101 103  --  100 103 96 101  CO2 25 23  --  24 26 24 25   GLUCOSE 130* 163*  --  238* 250* 204* 165*  BUN 12 9  --  6 7 18 16   CREATININE 0.50 0.42*  --  0.39* 0.47* 0.57 0.50  CALCIUM 8.5 8.5  --  9.0 9.2 9.4 9.2  MG 1.7 1.7 1.9 1.7 2.1  --   --   PHOS 3.0 2.8  --  2.7 4.0  --   --    Liver Function Tests:  Recent Labs Lab 04/17/12 0350 04/18/12 0430 04/19/12 0500 04/20/12 0450 04/22/12 1309  AST  --   --   --   --  18  ALT  --   --   --   --  14  ALKPHOS  --   --   --   --  70  BILITOT  --   --   --   --  0.1*  PROT  --   --   --   --  6.7  ALBUMIN 2.7*   2.7* 2.6* 2.8* 2.6* 2.7*    Recent Labs Lab 04/22/12 1309  LIPASE 38   No results found for this basename: AMMONIA,  in the last 168 hours CBC:  Recent Labs Lab 04/18/12 0430 04/19/12 0500 04/20/12 0450 04/22/12 1309 04/23/12 0745  WBC 8.2 10.4 7.1 9.8 7.2  NEUTROABS  --   --   --  6.2  --   HGB 10.3* 10.9* 10.1* 10.8* 10.9*  HCT 32.3* 33.9* 32.6* 34.8* 35.1*  MCV 90.0 88.7 91.1 92.1 91.6  PLT 321 350 356 367 318   Cardiac Enzymes:  Recent Labs Lab 04/16/12 2200 04/22/12 1309  TROPONINI <0.30 <0.30   BNP (last 3 results) No results found for this basename: PROBNP,  in the last 8760 hours CBG:  Recent Labs Lab 04/22/12 1659 04/22/12 2202 04/23/12 0507 04/23/12 0710 04/23/12 1117  GLUCAP 109* 194* 175* 154* 164*    Recent Results (from the past 240 hour(s))  URINE CULTURE     Status: None   Collection Time    04/16/12  7:21 PM      Result Value Range Status   Specimen Description URINE, CATHETERIZED   Final   Special Requests levaquin (d/c'ed) Normal   Final   Culture  Setup Time 04/17/2012 03:02   Final   Colony Count NO GROWTH   Final   Culture NO GROWTH   Final   Report Status 04/17/2012 FINAL   Final  CULTURE, BLOOD (ROUTINE X 2)     Status: None   Collection Time    04/16/12 10:00 PM      Result Value Range Status   Specimen Description BLOOD LEFT ARM   Final   Special Requests BOTTLES DRAWN AEROBIC AND ANAEROBIC   Final   Culture  Setup Time 04/17/2012 02:54   Final   Culture NO GROWTH 5 DAYS   Final   Report Status 04/23/2012 FINAL   Final  CULTURE, BLOOD (ROUTINE X 2)     Status: None   Collection Time    04/16/12 10:19 PM      Result Value Range Status   Specimen Description BLOOD LEFT   Final   Special Requests BOTTLES DRAWN AEROBIC AND ANAEROBIC   Final   Culture  Setup Time 04/17/2012 02:54   Final   Culture NO GROWTH 5 DAYS   Final  Report Status 04/23/2012 FINAL   Final  CULTURE, BLOOD (ROUTINE X 2)     Status: None    Collection Time    04/22/12  1:24 PM      Result Value Range Status   Specimen Description BLOOD RIGHT ARM   Final   Special Requests BOTTLES DRAWN AEROBIC AND ANAEROBIC 8 CC EA   Final   Culture  Setup Time 04/22/2012 20:12   Final   Culture     Final   Value:        BLOOD CULTURE RECEIVED NO GROWTH TO DATE CULTURE WILL BE HELD FOR 5 DAYS BEFORE ISSUING A FINAL NEGATIVE REPORT   Report Status PENDING   Incomplete  CULTURE, BLOOD (ROUTINE X 2)     Status: None   Collection Time    04/22/12  1:34 PM      Result Value Range Status   Specimen Description BLOOD RIGHT ARM   Final   Special Requests BOTTLES DRAWN AEROBIC AND ANAEROBIC 5 CC EA   Final   Culture  Setup Time 04/22/2012 20:11   Final   Culture     Final   Value:        BLOOD CULTURE RECEIVED NO GROWTH TO DATE CULTURE WILL BE HELD FOR 5 DAYS BEFORE ISSUING A FINAL NEGATIVE REPORT   Report Status PENDING   Incomplete  MRSA PCR SCREENING     Status: None   Collection Time    04/22/12  4:46 PM      Result Value Range Status   MRSA by PCR NEGATIVE  NEGATIVE Final   Comment:            The GeneXpert MRSA Assay (FDA     approved for NASAL specimens     only), is one component of a     comprehensive MRSA colonization     surveillance program. It is not     intended to diagnose MRSA     infection nor to guide or     monitor treatment for     MRSA infections.     Studies: Ct Head Wo Contrast  04/22/2012  *RADIOLOGY REPORT*  Clinical Data: 72 year old female with altered mental status and decreased level of consciousness.  CT HEAD WITHOUT CONTRAST  Technique:  Contiguous axial images were obtained from the base of the skull through the vertex without contrast.  Comparison: 04/16/2012 and prior head CTs dating back to 05/31/2005.  Findings: Chronic small vessel white matter ischemic changes are again identified. Remote lacunar infarcts are present within bilateral basal ganglia, right thalamus and mid brain. No acute intracranial  abnormalities are identified, including mass lesion or mass effect, hydrocephalus, extra-axial fluid collection, midline shift, hemorrhage, or acute infarction.  The visualized bony calvarium is unremarkable except for surgical changes of the right frontal region.  IMPRESSION: No evidence of acute intracranial abnormality.  Chronic small vessel white matter ischemic changes and remote infarcts as described.   Original Report Authenticated By: Harmon Pier, M.D.    Dg Chest Portable 1 View  04/22/2012  *RADIOLOGY REPORT*  Clinical Data: Altered mental status  PORTABLE CHEST - 1 VIEW  Comparison: 04/16/2012.  Findings: Low volume chest.  No gross consolidation. Cardiopericardial silhouette and mediastinal contours within normal limits allowing for volumes. Monitoring leads are projected over the chest.  IMPRESSION: Low volume chest.   Original Report Authenticated By: Andreas Newport, M.D.     Scheduled Meds: . amoxicillin-clavulanate  800 mg Per Tube  Q12H  . antiseptic oral rinse  15 mL Mouth Rinse q12n4p  . enoxaparin (LOVENOX) injection  40 mg Subcutaneous Q24H  . famotidine  20 mg Oral Daily  . feeding supplement (GLUCERNA 1.2 CAL)  237 mL Per Tube 5 X Daily  . fenofibrate  160 mg Oral Daily  . free water  30 mL Per Tube 5 X Daily  . insulin aspart  0-15 Units Subcutaneous TID WC  . insulin detemir  30 Units Subcutaneous BID  . levETIRAcetam  1,000 mg Per Tube BID  . metFORMIN  1,000 mg Oral BID WC  . metoprolol tartrate  100 mg Per Tube BID  . multivitamin  5 mL Per Tube Daily  . nystatin   Topical BID  . PARoxetine  40 mg Per Tube q morning - 10a  . phenytoin  100 mg Oral TID  . pregabalin  150 mg Per Tube BID  . sodium chloride  3 mL Intravenous Q12H   Continuous Infusions: . sodium chloride 75 mL/hr at 04/22/12 1817    Principal Problem:   Encephalopathy Active Problems:   Anxiety and depression   Diabetes mellitus   HCAP (healthcare-associated pneumonia)   Protein-calorie  malnutrition, severe   HTN (hypertension)   Focal seizure   Parkinson's disease    Time spent: 30 min    Cadan Maggart, [facility]  [facility] Pager [phone]. If 7PM-7AM, please contact night-coverage at [email or web address], password [facility] 04/23/2012, 3:21 PM  LOS: 1 day

## 2012-04-23 NOTE — Progress Notes (Signed)
INITIAL NUTRITION ASSESSMENT  DOCUMENTATION CODES Per approved criteria  -Not Applicable   INTERVENTION: - Obtained verbal order from MD to manage TF - Will change TF to Glucerna 1.2 via PEG to 5 cans/day which will provide a total of 1425 calories, 71g protein, water meeting 95% estimated calorie and protein needs.  - Will order 30ml water flushes before and after each bolus TF administration for tube patency. If IVF d/c, recommend changing water flushes to 60ml before and after each bolus TF administration. - Will order multivitamin 5ml via PEG  - Initiated adult enteral protocol - Will continue to monitor   NUTRITION DIAGNOSIS: Inadequate oral intake related to history of dysphagia and aspiration pneumonia as evidenced by pt with PEG using TF as main source of nutrition.   Goal: TF tolerance and to meet >90% of estimated nutritional needs.   Monitor:  Weights, labs, TF tolerance, blood sugars  Reason for Assessment: Home TF  72 y.o. female  Admitting Dx: Encephalopathy  ASSESSMENT: Pt with history of cerebral palsy, Parkinson's disease, type 2 diabetes mellitus, renal cancer, dysphagia with history of recurrent aspiration pneumonia, status post PEG tube placement admitted with altered mental status. Pt was d/c from this hospital 2 days ago.   Pt known to RD from previous admission. Home regimen was Jevity 1.2 237 ml 4 times daily with 8oz water after each can, which provided 1138 kcal (76% estimated kcal needs), 53 g protein (71% estimated needs), and 1728 ml free water. Attempted to call husband to see how pt was tolerating TF at home, however there was no answer. Noted pt with elevated blood sugars recently. Pt's weight down 9 pounds in the past 2 weeks.   Height: Ht Readings from Last 1 Encounters:  04/08/12 5\' 3"  (1.6 m)    Weight: Wt Readings from Last 1 Encounters:  04/19/12 163 lb 2.3 oz (74 kg)    Ideal Body Weight: 115 lb  % Ideal Body Weight: 142  Wt  Readings from Last 10 Encounters:  04/19/12 163 lb 2.3 oz (74 kg)  03/05/12 172 lb 11.2 oz (78.336 kg)    Usual Body Weight: 172 lb on 04/08/12  % Usual Body Weight: 95  BMI:  28.9 kg/(m^2)  Estimated Nutritional Needs: Kcal: 1500-1700 Protein: 75-90g Fluid: 1.5-1.7L/day  Skin: Intact, PEG in place  Diet Order: NPO  EDUCATION NEEDS: -No education needs identified at this time   Intake/Output Summary (Last 24 hours) at 04/23/12 1229 Last data filed at 04/23/12 1100  Gross per 24 hour  Intake      0 ml  Output    250 ml  Net   -250 ml    Last BM: PTA  Labs:   Recent Labs Lab 04/18/12 0430 04/18/12 1540 04/19/12 0500 04/20/12 0450 04/22/12 1309 04/23/12 0745  NA 138  --  134* 137 132* 136  K 3.0* 3.9 3.5 3.9 4.5 4.3  CL 103  --  100 103 96 101  CO2 23  --  24 26 24 25   BUN 9  --  6 7 18 16   CREATININE 0.42*  --  0.39* 0.47* 0.57 0.50  CALCIUM 8.5  --  9.0 9.2 9.4 9.2  MG 1.7 1.9 1.7 2.1  --   --   PHOS 2.8  --  2.7 4.0  --   --   GLUCOSE 163*  --  238* 250* 204* 165*    CBG (last 3)   Recent Labs  04/23/12 0507 04/23/12  0710 04/23/12 1117  GLUCAP 175* 154* 164*    Scheduled Meds: . amoxicillin-clavulanate  800 mg Per Tube Q12H  . antiseptic oral rinse  15 mL Mouth Rinse q12n4p  . enoxaparin (LOVENOX) injection  40 mg Subcutaneous Q24H  . famotidine  20 mg Oral Daily  . feeding supplement (JEVITY 1.2 CAL)  237 mL Per Tube QID  . fenofibrate  160 mg Oral Daily  . insulin aspart  0-15 Units Subcutaneous TID WC  . insulin detemir  30 Units Subcutaneous BID  . levETIRAcetam  1,000 mg Per Tube BID  . metFORMIN  1,000 mg Oral BID WC  . metoprolol tartrate  100 mg Per Tube BID  . multivitamin  5 mL Per Tube Daily  . nystatin   Topical BID  . PARoxetine  40 mg Per Tube q morning - 10a  . phenytoin  100 mg Oral TID  . pregabalin  150 mg Per Tube BID  . sodium chloride  3 mL Intravenous Q12H    Continuous Infusions: . sodium chloride 75 mL/hr at  04/22/12 1817    Past Medical History  Diagnosis Date  . Diabetes mellitus   . Renal disorder   . CP (cerebral palsy)   . Parkinsonism   . Dysphagia   . Recurrent aspiration pneumonia   . Bladder spasms   . Amputation of arm   . Recurrent UTI   . Renal cancer     s/p nephrectomy    Past Surgical History  Procedure Laterality Date  . 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 Peg tube placement    . Knee surgery    . Nephrectomy       Levon Hedger MS, RD, LDN [phone] Pager [phone] After Hours Pager

## 2012-04-24 ENCOUNTER — Inpatient Hospital Stay (HOSPITAL_COMMUNITY)
Admit: 2012-04-24 | Discharge: 2012-04-24 | Disposition: A | Discharge status: Home / Self Care | Payer: 59 | Attending: Internal Medicine | Admitting: Internal Medicine

## 2012-04-24 DIAGNOSIS — G20 Parkinson's disease: Secondary | ICD-10-CM

## 2012-04-24 LAB — URINE DRUGS OF ABUSE SCREEN W ALC, ROUTINE (REF LAB)
Benzodiazepines.: NEGATIVE
Cocaine Metabolites: NEGATIVE
Ethyl Alcohol: <10 mg/dL (ref <10)
Marijuana Metabolite: NEGATIVE
Opiate Screen, Urine: NEGATIVE
Phencyclidine (PCP): NEGATIVE

## 2012-04-24 LAB — URINE CULTURE

## 2012-04-24 LAB — CBC
MCH: 28.6 pg (ref 26.0–34.0)
MCHC: 31 g/dL (ref 30.0–36.0)
MCV: 92.2 fL (ref 78.0–100.0)
Platelets: 322 10*3/uL (ref 150–400)
RBC: 3.85 MIL/uL — ABNORMAL LOW (ref 3.87–5.11)
RDW: 15.3 % (ref 11.5–15.5)

## 2012-04-24 LAB — GLUCOSE, CAPILLARY
Glucose-Capillary: 110 mg/dL — ABNORMAL HIGH (ref 70–99)
Glucose-Capillary: 123 mg/dL — ABNORMAL HIGH (ref 70–99)
Glucose-Capillary: 131 mg/dL — ABNORMAL HIGH (ref 70–99)

## 2012-04-24 LAB — BASIC METABOLIC PANEL
Calcium: 9.9 mg/dL (ref 8.4–10.5)
Creatinine, Ser: 0.52 mg/dL (ref 0.50–1.10)
GFR calc Af Amer: >90 mL/min (ref >90)
GFR calc non Af Amer: >90 mL/min (ref >90)
Sodium: 139 mEq/L (ref 135–145)

## 2012-04-24 NOTE — Progress Notes (Signed)
EEG COMPLETED

## 2012-04-24 NOTE — Progress Notes (Addendum)
[facility] PROGRESS NOTE  [name] NWG:[id number] DOB: 18-Nov-1940 DOA: 04/22/2012 PCP: No primary provider on file.  Assessment/Plan  72 yo F with hx of Cerebral palsy, Parkinson's disease, bedbound, dysphagia with recurrent aspiration on tube feeds, DM, who was just hospitalized for the last several weeks with episodic unresponsiveness and aspiration pneumonia.  She was treated for 16 days with antibiotics for pneumonia and started on antiepileptic medication.  During admission there was concern that the husband and daytime caregiver, may not have been administering medications correctly.  SW and CM were involved.  She was discharged and returned less than a day later with another episode of unresponsiveness.  May have been sedation due to benzo/opiate, however, UDS completely negative for these medications.  Also phenytoin level was low, raising question of medication compliance and possible unwitnessed seizure with post-ictal state.    Somnolence/encephalopathy, resolved:  DDx includes incorrect medication administration, side effect of medication administered correctly, seizure in the setting of subtherapeutic phenytoin, illicit drug use.  Patient back to baseline mentation.   -  Continue holding clonazepam and vicodin -  Urinalysis with yeast, however, this is not a likely cause of unresponsiveness -  On treatment for aspiration pneumonia -  UDS negative despite patient being prescribed narcotics and benzodiazepines -  Continue previous dose of phenytoin -  F/u free phenytoin level this AM -  Repeat EEG pending -  ** patient has had 3 head CTs in the last 2.5 weeks**  Focal seizures  -  Continue home Keppra and Dilantin (previously therapeutic) -  Free phenytoin level pending  -  EEG pending  Anxiety and depression, stable Continue home medications.   Diabetes mellitus, FS stable continue sliding scale insulin and dose of Lantus.   Protein-calorie malnutrition,  severe.   Continue nutritional supplements -  Appreciate nutrition recommendations re tube feeds  HTN (hypertension) BP stable.   Continue home medications   Cerebral palsy and Parkinson's disease  Seems at baseline. Continue medications   Yeast on urinalysis while on antibiotics, asymptomatic -  D/c foley -  D/c antibiotics -  Repeat UA in a few days to verify that yeast has resolved  Diet:  Glucerna 1.2 5X/day Access:  PIV IVF:  NS at 17ml/h -- adjust FW boluses when discontinued Proph:  lovenox  Code Status: full code Family Communication: spoke with patient alone today.   Disposition Plan:  F/u EEG.  Husband thinks that he did not administer her medications the night she was discharged, but insists he gave her her medications the morning she was admitted.     Consultants:  none  Procedures:  CT head  Antibiotics:  Augmentin OFF 3/17  HPI/Subjective:  Denies fevers, chills, headache.  Denies chest pain and shortness of breath.  Denies nausea, vomiting, diarrhea.  Denies constipation.   Tube feeds working alright.    Objective: Filed Vitals:   04/23/12 1332 04/23/12 2200 04/24/12 0630 04/24/12 1319  BP: 104/63 133/75 121/72 126/72  Pulse: 76 82 79 76  Temp: 99.5 F (37.5 C) 99.2 F (37.3 C) 98.6 F (37 C) 99.3 F (37.4 C)  TempSrc: Oral Oral Oral Oral  Resp: 18 18 18 18   SpO2: 100% 99% 99% 99%    Intake/Output Summary (Last 24 hours) at 04/24/12 1736 Last data filed at 04/24/12 1514  Gross per 24 hour  Intake 3713.75 ml  Output   1850 ml  Net 1863.75 ml   There were no vitals filed for this visit.  Exam:   General:  Obese F, no acute distress, barely able to turn head from where it is positioned facing right (near baseline).    HEENT:  NCAT, MMM  Cardiovascular:  RRR, nl S1, S2 no mrg, 2+ pulses, warm extremities  Respiratory:  CTAB, no increased WOB  Abdomen:   NABS, soft, NT/ND.  G-tube site c/d/i  MSK:   Normal tone and bulk, no  LEE.  Left arm AEA  Neuro:  1/5 strength bilateral legs, 3/5 upper extremities.  Alert and able to answer questions appropriately.  Had some jerking motions of the right upper extremity, however, patient is able to answer questions during episode and can suppress the movement if she thinks about it.    Data Reviewed: Basic Metabolic Panel:  Recent Labs Lab 04/18/12 0430 04/18/12 1540 04/19/12 0500 04/20/12 0450 04/22/12 1309 04/23/12 0745 04/24/12 0500  NA 138  --  134* 137 132* 136 139  K 3.0* 3.9 3.5 3.9 4.5 4.3 4.4  CL 103  --  100 103 96 101 103  CO2 23  --  24 26 24 25 27   GLUCOSE 163*  --  238* 250* 204* 165* 140*  BUN 9  --  6 7 18 16 14   CREATININE 0.42*  --  0.39* 0.47* 0.57 0.50 0.52  CALCIUM 8.5  --  9.0 9.2 9.4 9.2 9.9  MG 1.7 1.9 1.7 2.1  --   --   --   PHOS 2.8  --  2.7 4.0  --   --   --    Liver Function Tests:  Recent Labs Lab 04/18/12 0430 04/19/12 0500 04/20/12 0450 04/22/12 1309  AST  --   --   --  18  ALT  --   --   --  14  ALKPHOS  --   --   --  70  BILITOT  --   --   --  0.1*  PROT  --   --   --  6.7  ALBUMIN 2.6* 2.8* 2.6* 2.7*    Recent Labs Lab 04/22/12 1309  LIPASE 38   No results found for this basename: AMMONIA,  in the last 168 hours CBC:  Recent Labs Lab 04/19/12 0500 04/20/12 0450 04/22/12 1309 04/23/12 0745 04/24/12 0500  WBC 10.4 7.1 9.8 7.2 7.5  NEUTROABS  --   --  6.2  --   --   HGB 10.9* 10.1* 10.8* 10.9* 11.0*  HCT 33.9* 32.6* 34.8* 35.1* 35.5*  MCV 88.7 91.1 92.1 91.6 92.2  PLT 350 356 367 318 322   Cardiac Enzymes:  Recent Labs Lab 04/22/12 1309  TROPONINI <0.30   BNP (last 3 results) No results found for this basename: PROBNP,  in the last 8760 hours CBG:  Recent Labs Lab 04/23/12 1800 04/23/12 2222 04/24/12 0756 04/24/12 1510 04/24/12 1705  GLUCAP 163* 120* 127* 131* 110*    Recent Results (from the past 240 hour(s))  URINE CULTURE     Status: None   Collection Time    04/16/12  7:21 PM       Result Value Range Status   Specimen Description URINE, CATHETERIZED   Final   Special Requests levaquin (d/c'ed) Normal   Final   Culture  Setup Time 04/17/2012 03:02   Final   Colony Count NO GROWTH   Final   Culture NO GROWTH   Final   Report Status 04/17/2012 FINAL   Final  CULTURE, BLOOD (ROUTINE X 2)  Status: None   Collection Time    04/16/12 10:00 PM      Result Value Range Status   Specimen Description BLOOD LEFT ARM   Final   Special Requests BOTTLES DRAWN AEROBIC AND ANAEROBIC   Final   Culture  Setup Time 04/17/2012 02:54   Final   Culture NO GROWTH 5 DAYS   Final   Report Status 04/23/2012 FINAL   Final  CULTURE, BLOOD (ROUTINE X 2)     Status: None   Collection Time    04/16/12 10:19 PM      Result Value Range Status   Specimen Description BLOOD LEFT   Final   Special Requests BOTTLES DRAWN AEROBIC AND ANAEROBIC   Final   Culture  Setup Time 04/17/2012 02:54   Final   Culture NO GROWTH 5 DAYS   Final   Report Status 04/23/2012 FINAL   Final  CULTURE, BLOOD (ROUTINE X 2)     Status: None   Collection Time    04/22/12  1:24 PM      Result Value Range Status   Specimen Description BLOOD RIGHT ARM   Final   Special Requests BOTTLES DRAWN AEROBIC AND ANAEROBIC 8 CC EA   Final   Culture  Setup Time 04/22/2012 20:12   Final   Culture     Final   Value:        BLOOD CULTURE RECEIVED NO GROWTH TO DATE CULTURE WILL BE HELD FOR 5 DAYS BEFORE ISSUING A FINAL NEGATIVE REPORT   Report Status PENDING   Incomplete  CULTURE, BLOOD (ROUTINE X 2)     Status: None   Collection Time    04/22/12  1:34 PM      Result Value Range Status   Specimen Description BLOOD RIGHT ARM   Final   Special Requests BOTTLES DRAWN AEROBIC AND ANAEROBIC 5 CC EA   Final   Culture  Setup Time 04/22/2012 20:11   Final   Culture     Final   Value:        BLOOD CULTURE RECEIVED NO GROWTH TO DATE CULTURE WILL BE HELD FOR 5 DAYS BEFORE ISSUING A FINAL NEGATIVE REPORT   Report Status  PENDING   Incomplete  URINE CULTURE     Status: None   Collection Time    04/22/12  2:02 PM      Result Value Range Status   Specimen Description URINE, CLEAN CATCH   Final   Special Requests NONE   Final   Culture  Setup Time 04/22/2012 20:30   Final   Colony Count 50,000 COLONIES/ML   Final   Culture YEAST   Final   Report Status 04/24/2012 FINAL   Final  MRSA PCR SCREENING     Status: None   Collection Time    04/22/12  4:46 PM      Result Value Range Status   MRSA by PCR NEGATIVE  NEGATIVE Final   Comment:            The GeneXpert MRSA Assay (FDA     approved for NASAL specimens     only), is one component of a     comprehensive MRSA colonization     surveillance program. It is not     intended to diagnose MRSA     infection nor to guide or     monitor treatment for     MRSA infections.     Studies: No results found.  Scheduled Meds: . antiseptic oral rinse  15 mL Mouth Rinse q12n4p  . enoxaparin (LOVENOX) injection  40 mg Subcutaneous Q24H  . famotidine  20 mg Oral Daily  . feeding supplement (GLUCERNA 1.2 CAL)  237 mL Per Tube 5 X Daily  . fenofibrate  160 mg Oral Daily  . free water  30 mL Per Tube 5 X Daily  . insulin aspart  0-15 Units Subcutaneous TID WC  . insulin detemir  30 Units Subcutaneous BID  . levETIRAcetam  1,000 mg Per Tube BID  . metFORMIN  1,000 mg Oral BID WC  . metoprolol tartrate  100 mg Per Tube BID  . multivitamin  5 mL Per Tube Daily  . nystatin   Topical BID  . PARoxetine  40 mg Per Tube q morning - 10a  . phenytoin  100 mg Oral TID  . pregabalin  150 mg Per Tube BID  . sodium chloride  3 mL Intravenous Q12H   Continuous Infusions: . sodium chloride 75 mL/hr at 04/22/12 1817    Principal Problem:   Encephalopathy Active Problems:   Anxiety and depression   Diabetes mellitus   HCAP (healthcare-associated pneumonia)   Protein-calorie malnutrition, severe   HTN (hypertension)   Focal seizure   Parkinson's disease    Time  spent: 30 min    Wai Litt, [facility]  [facility] Pager [phone]. If 7PM-7AM, please contact night-coverage at [email or web address], password [facility] 04/24/2012, 5:36 PM  LOS: 2 days

## 2012-04-25 DIAGNOSIS — E785 Hyperlipidemia, unspecified: Secondary | ICD-10-CM

## 2012-04-25 DIAGNOSIS — R030 Elevated blood-pressure reading, without diagnosis of hypertension: Secondary | ICD-10-CM

## 2012-04-25 DIAGNOSIS — E43 Unspecified severe protein-calorie malnutrition: Secondary | ICD-10-CM

## 2012-04-25 LAB — BASIC METABOLIC PANEL
BUN: 12 mg/dL (ref 6–23)
CO2: 24 mEq/L (ref 19–32)
Chloride: 101 mEq/L (ref 96–112)
Creatinine, Ser: 0.49 mg/dL — ABNORMAL LOW (ref 0.50–1.10)
GFR calc Af Amer: >90 mL/min (ref >90)
Potassium: 3.9 mEq/L (ref 3.5–5.1)

## 2012-04-25 LAB — CBC
HCT: 36.1 % (ref 36.0–46.0)
MCV: 92.3 fL (ref 78.0–100.0)
RBC: 3.91 MIL/uL (ref 3.87–5.11)
RDW: 15.2 % (ref 11.5–15.5)
WBC: 8 10*3/uL (ref 4.0–10.5)

## 2012-04-25 LAB — GLUCOSE, CAPILLARY: Glucose-Capillary: 132 mg/dL — ABNORMAL HIGH (ref 70–99)

## 2012-04-25 MED ORDER — HYDROCODONE-ACETAMINOPHEN 5-325 MG PO TABS: 1.0000 | ORAL_TABLET | Freq: Two times a day (BID) | ORAL | Status: DC | PRN

## 2012-04-25 MED ORDER — GLUCERNA 1.2 CAL PO LIQD: 237.0000 mL | Freq: Every day | ORAL | Status: DC

## 2012-04-25 MED ORDER — CLONAZEPAM 0.5 MG PO TABS: 0.5000 mg | ORAL_TABLET | Freq: Two times a day (BID) | ORAL | Status: DC | PRN

## 2012-04-25 MED FILL — Insulin Detemir Inj 100 Unit/ML: SUBCUTANEOUS | Qty: 0.3 | Status: AC

## 2012-04-25 NOTE — Plan of Care (Signed)
Problem: Phase I Progression Outcomes Goal: Voiding-avoid urinary catheter unless indicated Outcome: Not Applicable Date Met:  04/25/12 She had foley cath. Before this admission and is being released with it.  Problem: Phase III Progression Outcomes Goal: Foley discontinued Outcome: Not Met (add Reason) Groin rash not resolved yet. Dr. Cena Benton aware.

## 2012-04-25 NOTE — Progress Notes (Signed)
After obtaining her signed-in meds from our pharmacy; pt. Is d/c'd per PTAR without incident.  Her husband is here at time of d/c and takes all her meds with him and signs for receipt of prescriptions and instructions.

## 2012-04-25 NOTE — Care Management Note (Signed)
  Page 2 of 2   04/25/2012     4:27:15 PM   CARE MANAGEMENT NOTE 04/25/2012  Patient:  [name]   Account Number:  [id number]  Date Initiated:  04/25/2012  Documentation initiated by:  Colleen Can  Subjective/Objective Assessment:   DX ALTERED MENTAL STATUS     Action/Plan:   CM spoke with patient 's spouse -[name] via telephone. He plans to take patient back home upon discharge. Currently declining nursing facility. States he has personal care caregiver with patient Mon thru Friday from 7:30- 4:30pm.   Anticipated DC Date:  04/25/2012   Anticipated DC Plan:  HOME W HOME HEALTH SERVICES  In-house referral  Clinical Social Worker      DC Associate Professor  CM consult      [facility]  HOME [facility]   Choice offered to / List presented to:  C-3 Spouse        HH arranged  HH-1 RN  HH-4 NURSE'S AIDE  HH-6 SOCIAL WORKER  HH-3 OT  HH-2 PT      [facility] agency  [facility]   Status of service:  Completed, signed off Medicare Important Message given?   (If response is "NO", the following Medicare IM given date fields will be blank) Date Medicare IM given:   Date Additional Medicare IM given:    Discharge Disposition:    Per UR Regulation:  Reviewed for med. necessity/level of care/duration of stay  If discussed at Long Length of Stay Meetings, dates discussed:    Comments:  04/25/2012 Colleen Can BSN RN CCM [phone] Spouse states patient does not need DME. Wants [facility] who had been arranged to provide  services for patient from previous admission . Pt does have PEG for tube feedings and does have foley in place (orders for removal of foley in 1 wk). [facility] will provide feeding supplements. [facility] has been notfied and is aware that patient will discharge today with HH services-RN, PT, OT, HH aid, and SW. Pt will require ambulance transfer-SW notified. Spouse/caregiver states he will arrive at hospital at 5pm. Dr Cena Benton notified of  spouse's arrival time.

## 2012-04-25 NOTE — Procedures (Signed)
REFERRING PHYSICIAN:  Dr. Malachi Bonds.  HISTORY:  A 72 year old female with episode of unresponsiveness.  MEDICATIONS:  Keppra, Glucophage, Dilantin, Lyrica, Lopressor, Paxil, TriCor, NovoLog.  CONDITIONS OF RECORDING:  This is a 16-channel EEG carried out with the patient in the drowsy and asleep state.  DESCRIPTION:  The background activity during drowse is slow and this consists mostly of theta activity.  Maximum rhythm is a 6-7 Hz theta. There is an absence of faster frequencies.  It was poorly organized but continuos.  Briefly, during the tracing, the patient goes into a light sleep with symmetrical sleep spindles, vertex is sharp activity and irregular slow activity.  Hyperventilation was not performed. Intermittent photic stimulation failed to elicit any change in the tracing.  IMPRESSION:  This is a normal drowsy and asleep EEG.  No epileptiform activity was noted.          ______________________________ Thana Farr, MD    ZO:[id number] D:  04/24/2012 18:45:14  T:  04/25/2012 02:21:25  Job #:  [id number]

## 2012-04-25 NOTE — Discharge Summary (Addendum)
Physician Discharge Summary  [name]:[id number] DOB: [birth year] DOA: 04/22/2012  PCP: No primary provider on file.  Admit date: 04/22/2012 Discharge date: 04/25/2012  Time spent: > 35 minutes  Recommendations for Outpatient Follow-up:  1. Patient needs follow up with her neurologist and will need f/u levels of her antiepileptics 2. Also avoid increasing pain and/or anti anxiolytics given recurrent admissions for altered mental status  Discharge Diagnoses:  Principal Problem:   Encephalopathy Active Problems:   Anxiety and depression   Diabetes mellitus   HCAP (healthcare-associated pneumonia)   Protein-calorie malnutrition, severe   HTN (hypertension)   Focal seizure   Parkinson's disease   Discharge Condition: stable  Diet recommendation: low sodium/heart healthy  There were no vitals filed for this visit.  History of present illness:  For details regarding H and P and imaging studies please refer to EMR. 72 y/o CF with h/o cerebral palsy, Parkinson's disease, DM type 2, renal cancer, dysphagia with recent hospital admission for asp. Pneumonia.  Presented to the ED due to somnolence and change in mental status.  Hospital Course:  Encephalopathy - Initially phenytoin level on 3/16 was low.  Differential is medication induced (benzodiazepines vs breakthrough seizure with post ictal state at home). Of note UDS was negative - lactic acid initially negative. - Troponin negative - Phenytoin level < 2.5 (04/22/12) on initial admission - blood cultures negative, patient afebrile - portable chest x ray reported as low volume chest. - On day of discharge resolved. Patient was interactive with examiner and nursing staff and requesting discharge, given resolution of encephalopathy/somnolence have discussed discharge plans with husband, patient, and case Production designer, theatre/television/film. - EEG while in house showed a normal drowsy and asleep EEG. No epileptiform activity was noted.  DM - Stable  while in house - patient to continue home regimen - creatinine 0.4, therefore will continue metformin  Parkinsons - patient to f/u with her neurologist as outpatient for further evaluation and recommendations, otherwise to continue home regimen  Anxiety/depression - have decreased benzodiazepine due to # 1 - Continue paxil  Protein calorie malnutrition - Addendum: Per dietitian recommendations INTERVENTION:  - Obtained verbal order from MD to manage TF  - Will change TF to Glucerna 1.2 via PEG to 5 cans/day which will provide a total of 1425 calories, 71g protein, water meeting 95% estimated calorie and protein needs.  - Will order 30ml water flushes before and after each bolus TF administration for tube patency. If IVF d/c, recommend changing water flushes to 60ml before and after each bolus TF administration.   HTN - stable with last blood pressure 129/61.  - will continue home regimen metoprolol  Procedures:  none  Consultations:  none  Discharge Exam: Filed Vitals:   04/24/12 2155 04/24/12 2233 04/25/12 0200 04/25/12 0620  BP: 150/68 150/68 118/60 129/61  Pulse: 72 72 93 77  Temp:  99.8 F (37.7 C) 98.2 F (36.8 C) 97.2 F (36.2 C)  TempSrc:  Oral Oral Oral  Resp:  18 16 18   SpO2:  91% 100% 100%    General: Pt in NAD, Alert and Awake Cardiovascular: RRR, No MRG Respiratory: CTA BL, no wheezes, no increased work of breathing  Discharge Instructions  Discharge Orders   Future Orders Complete By Expires     Call MD for:  persistant dizziness or light-headedness  As directed     Call MD for:  temperature >100.4  As directed     Diet - low sodium heart healthy  As directed     Discharge instructions  As directed     Comments:      Discharged home with home health.  Patient should also follow up with her neurologist as indicated on last discharge.    Increase activity slowly  As directed         Medication List    TAKE these medications        antiseptic oral rinse Liqd  15 mLs by Mouth Rinse route 2 times daily at 12 noon and 4 pm.     clonazePAM 0.5 MG tablet  Commonly known as:  KLONOPIN  Place 1 tablet (0.5 mg total) into feeding tube 2 (two) times daily as needed for anxiety.     diphenoxylate-atropine 2.5-0.025 MG per tablet  Commonly known as:  LOMOTIL  Place 1 tablet into feeding tube 3 (three) times daily as needed. For diarrhea     feeding supplement (JEVITY 1.2 CAL) Liqd  Place 237 mLs into feeding tube 4 (four) times daily. Flush with 8oz of water after each tube feed.  1 month supply     fenofibrate 145 MG tablet  Commonly known as:  TRICOR  Place 145 mg into feeding tube at bedtime.     HYDROcodone-acetaminophen 5-325 MG per tablet  Commonly known as:  NORCO/VICODIN  Place 1 tablet into feeding tube 2 (two) times daily as needed for pain.     hydrocortisone cream 1 %  Apply 1 application topically 3 (three) times daily. Applied to affected areas.     insulin aspart 100 UNIT/ML injection  Commonly known as:  novoLOG  Inject 0-15 Units into the skin every 4 (four) hours. Before each meal 3 times a day, 140-199 - 2 units, 200-250 - 4 units, 251-299 - 6 units,  300-349 - 8 units,  350 or above 10 units.     insulin detemir 100 UNIT/ML injection  Commonly known as:  LEVEMIR  Inject 30 Units into the skin 2 (two) times daily.     levETIRAcetam 1000 MG tablet  Commonly known as:  KEPPRA  Place 1,000 mg into feeding tube 2 (two) times daily.     metFORMIN 1000 MG tablet  Commonly known as:  GLUCOPHAGE  Place 1,000 mg into feeding tube 2 (two) times daily with a meal.     metoprolol 100 MG tablet  Commonly known as:  LOPRESSOR  Place 1 tablet (100 mg total) into feeding tube 2 (two) times daily.     multivitamin with minerals Tabs  Place 1 tablet into feeding tube daily.     nystatin 100000 UNIT/GM Powd  Apply topically 3 (three) times daily. Applied to groin rash.     PARoxetine 40 MG tablet   Commonly known as:  PAXIL  Place 40 mg into feeding tube every morning.     phenytoin 100 MG ER capsule  Commonly known as:  DILANTIN  Place 300 mg into feeding tube daily.     pregabalin 150 MG capsule  Commonly known as:  LYRICA  Place 150 mg into feeding tube 2 (two) times daily.     ranitidine 300 MG tablet  Commonly known as:  ZANTAC  Place 300 mg into feeding tube at bedtime.          The results of significant diagnostics from this hospitalization (including imaging, microbiology, ancillary and laboratory) are listed below for reference.    Significant Diagnostic Studies: Ct Head Wo Contrast  04/22/2012  *RADIOLOGY REPORT*  Clinical  Data: 72 year old female with altered mental status and decreased level of consciousness.  CT HEAD WITHOUT CONTRAST  Technique:  Contiguous axial images were obtained from the base of the skull through the vertex without contrast.  Comparison: 04/16/2012 and prior head CTs dating back to 05/31/2005.  Findings: Chronic small vessel white matter ischemic changes are again identified. Remote lacunar infarcts are present within bilateral basal ganglia, right thalamus and mid brain. No acute intracranial abnormalities are identified, including mass lesion or mass effect, hydrocephalus, extra-axial fluid collection, midline shift, hemorrhage, or acute infarction.  The visualized bony calvarium is unremarkable except for surgical changes of the right frontal region.  IMPRESSION: No evidence of acute intracranial abnormality.  Chronic small vessel white matter ischemic changes and remote infarcts as described.   Original Report Authenticated By: Harmon Pier, M.D.    Ct Head Wo Contrast  04/16/2012  *RADIOLOGY REPORT*  Clinical Data: Altered mental status, cerebral palsy  CT HEAD WITHOUT CONTRAST  Technique:  Contiguous axial images were obtained from the base of the skull through the vertex without contrast.  Comparison: 04/08/2012  Findings: Stable postop  changes in the anterior right frontal lobe. Diffuse brain atrophy noted with ventricular enlargement as before. No significant interval change.  No acute intracranial hemorrhage, definite mass lesion, acute infarction, focal edema, mass effect, midline shift, or extra-axial fluid collection.  Cisterns patent. Cerebellar atrophy as well.  Exam is limited because of oblique acquisition.  Chronic periventricular white matter microvascular ischemic changes noted diffusely.  Basal ganglia lacunar infarcts noted bilaterally.  IMPRESSION: Stable chronic atrophy and microvascular ischemic changes as well as postoperative findings.  No acute process by noncontrast CT or significant interval change.   Original Report Authenticated By: Judie Petit. Miles Costain, M.D.    Ct Head Wo Contrast  04/08/2012  *RADIOLOGY REPORT*  Clinical Data: Altered mental status.  CT HEAD WITHOUT CONTRAST  Technique:  Contiguous axial images were obtained from the base of the skull through the vertex without contrast.  Comparison: 07/30/2009  Findings: Postsurgical changes in the right frontal lobe.  These are stable since prior study.  Atrophy.  Associated ventriculomegaly, likely related to ex vacuo dilatation.  No acute infarction or hemorrhage.  No extra-axial fluid collection.  No acute calvarial abnormality. Visualized paranasal sinuses and mastoids clear.  Orbital soft tissues unremarkable.  IMPRESSION: No acute intracranial abnormality.  No change since prior study.   Original Report Authenticated By: Charlett Nose, M.D.    Dg Chest Portable 1 View  04/22/2012  *RADIOLOGY REPORT*  Clinical Data: Altered mental status  PORTABLE CHEST - 1 VIEW  Comparison: 04/16/2012.  Findings: Low volume chest.  No gross consolidation. Cardiopericardial silhouette and mediastinal contours within normal limits allowing for volumes. Monitoring leads are projected over the chest.  IMPRESSION: Low volume chest.   Original Report Authenticated By: Andreas Newport, M.D.     Dg Chest Port 1 View  04/17/2012  *RADIOLOGY REPORT*  Clinical Data: Weakness, shortness of breath  PORTABLE CHEST - 1 VIEW  Comparison: Portable chest x-ray of 04/11/2012  Findings: There is still slight elevation of the right hemidiaphragm.  No infiltrate or effusion is seen.  The right PICC line is unchanged in position, with the tip overlying the mid upper SVC.  Heart size is stable.  IMPRESSION: No active infiltrate or effusion.  No change in right PICC line position.   Original Report Authenticated By: Dwyane Dee, M.D.    Dg Chest Port 1 View  04/11/2012  *RADIOLOGY  REPORT*  Clinical Data: PICC line placed  PORTABLE CHEST - 1 VIEW  Comparison: Chest radiograph 04/08/2012  Findings: Interval placement of a right PICC line with tip in the distal SVC.  There is no effusion, infiltrate, or pneumothorax.  IMPRESSION: Right PICC line in good position.   Original Report Authenticated By: Genevive Bi, M.D.    Dg Chest Port 1 View  04/08/2012  *RADIOLOGY REPORT*  Clinical Data: Altered mental status, fever.  PORTABLE CHEST - 1 VIEW  Comparison: 03/02/2012  Findings: New opacity noted in the left retrocardiac region concerning for pneumonia.  Low lung volumes.  Right lung is clear. Heart is normal size.  No visible effusions or acute bony abnormality.  IMPRESSION: Developing opacity at the left lung base concerning for pneumonia.  Low lung volumes.   Original Report Authenticated By: Charlett Nose, M.D.    Dg Abd Portable 1v  04/16/2012  *RADIOLOGY REPORT*  Clinical Data: Vomiting.  PORTABLE ABDOMEN - 1 VIEW  Comparison: Abdominal radiographs dated 03/22/2012 and CT scan dated 03/03/2012  Findings: There is minimal air in the bowel.  No dilated loops of bowel.  No visible free air on this supine radiograph.  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  IMPRESSION: Benign-appearing abdomen.   Original Report Authenticated By: Francene Boyers, M.D.     Microbiology: Recent Results (from the past 240  hour(s))  URINE CULTURE     Status: None   Collection Time    04/16/12  7:21 PM      Result Value Range Status   Specimen Description URINE, CATHETERIZED   Final   Special Requests levaquin (d/c'ed) Normal   Final   Culture  Setup Time 04/17/2012 03:02   Final   Colony Count NO GROWTH   Final   Culture NO GROWTH   Final   Report Status 04/17/2012 FINAL   Final  CULTURE, BLOOD (ROUTINE X 2)     Status: None   Collection Time    04/16/12 10:00 PM      Result Value Range Status   Specimen Description BLOOD LEFT ARM   Final   Special Requests BOTTLES DRAWN AEROBIC AND ANAEROBIC   Final   Culture  Setup Time 04/17/2012 02:54   Final   Culture NO GROWTH 5 DAYS   Final   Report Status 04/23/2012 FINAL   Final  CULTURE, BLOOD (ROUTINE X 2)     Status: None   Collection Time    04/16/12 10:19 PM      Result Value Range Status   Specimen Description BLOOD LEFT   Final   Special Requests BOTTLES DRAWN AEROBIC AND ANAEROBIC   Final   Culture  Setup Time 04/17/2012 02:54   Final   Culture NO GROWTH 5 DAYS   Final   Report Status 04/23/2012 FINAL   Final  CULTURE, BLOOD (ROUTINE X 2)     Status: None   Collection Time    04/22/12  1:24 PM      Result Value Range Status   Specimen Description BLOOD RIGHT ARM   Final   Special Requests BOTTLES DRAWN AEROBIC AND ANAEROBIC 8 CC EA   Final   Culture  Setup Time 04/22/2012 20:12   Final   Culture     Final   Value:        BLOOD CULTURE RECEIVED NO GROWTH TO DATE CULTURE WILL BE HELD FOR 5 DAYS BEFORE ISSUING A FINAL NEGATIVE REPORT   Report Status PENDING  Incomplete  CULTURE, BLOOD (ROUTINE X 2)     Status: None   Collection Time    04/22/12  1:34 PM      Result Value Range Status   Specimen Description BLOOD RIGHT ARM   Final   Special Requests BOTTLES DRAWN AEROBIC AND ANAEROBIC 5 CC EA   Final   Culture  Setup Time 04/22/2012 20:11   Final   Culture     Final   Value:        BLOOD CULTURE RECEIVED NO GROWTH TO DATE CULTURE  WILL BE HELD FOR 5 DAYS BEFORE ISSUING A FINAL NEGATIVE REPORT   Report Status PENDING   Incomplete  URINE CULTURE     Status: None   Collection Time    04/22/12  2:02 PM      Result Value Range Status   Specimen Description URINE, CLEAN CATCH   Final   Special Requests NONE   Final   Culture  Setup Time 04/22/2012 20:30   Final   Colony Count 50,000 COLONIES/ML   Final   Culture YEAST   Final   Report Status 04/24/2012 FINAL   Final  MRSA PCR SCREENING     Status: None   Collection Time    04/22/12  4:46 PM      Result Value Range Status   MRSA by PCR NEGATIVE  NEGATIVE Final   Comment:            The GeneXpert MRSA Assay (FDA     approved for NASAL specimens     only), is one component of a     comprehensive MRSA colonization     surveillance program. It is not     intended to diagnose MRSA     infection nor to guide or     monitor treatment for     MRSA infections.     Labs: Basic Metabolic Panel:  Recent Labs Lab 04/18/12 1540  04/19/12 0500 04/20/12 0450 04/22/12 1309 04/23/12 0745 04/24/12 0500 04/25/12 0445  NA  --   < > 134* 137 132* 136 139 137  K 3.9  --  3.5 3.9 4.5 4.3 4.4 3.9  CL  --   < > 100 103 96 101 103 101  CO2  --   < > 24 26 24 25 27 24   GLUCOSE  --   < > 238* 250* 204* 165* 140* 132*  BUN  --   < > 6 7 18 16 14 12   CREATININE  --   < > 0.39* 0.47* 0.57 0.50 0.52 0.49*  CALCIUM  --   < > 9.0 9.2 9.4 9.2 9.9 9.6  MG 1.9  --  1.7 2.1  --   --   --   --   PHOS  --   --  2.7 4.0  --   --   --   --   < > = values in this interval not displayed. Liver Function Tests:  Recent Labs Lab 04/19/12 0500 04/20/12 0450 04/22/12 1309  AST  --   --  18  ALT  --   --  14  ALKPHOS  --   --  70  BILITOT  --   --  0.1*  PROT  --   --  6.7  ALBUMIN 2.8* 2.6* 2.7*    Recent Labs Lab 04/22/12 1309  LIPASE 38   No results found for this basename: AMMONIA,  in the last 168 hours CBC:  Recent Labs Lab 04/20/12 0450 04/22/12 1309 04/23/12 0745  04/24/12 0500 04/25/12 0445  WBC 7.1 9.8 7.2 7.5 8.0  NEUTROABS  --  6.2  --   --   --   HGB 10.1* 10.8* 10.9* 11.0* 11.3*  HCT 32.6* 34.8* 35.1* 35.5* 36.1  MCV 91.1 92.1 91.6 92.2 92.3  PLT 356 367 318 322 288   Cardiac Enzymes:  Recent Labs Lab 04/22/12 1309  TROPONINI <0.30   BNP: BNP (last 3 results) No results found for this basename: PROBNP,  in the last 8760 hours CBG:  Recent Labs Lab 04/24/12 1705 04/24/12 2313 04/24/12 2336 04/25/12 0735 04/25/12 1118  GLUCAP 110* 118* 123* 132* 202*       Signed:  Darnel Mchan  [facility] 04/25/2012, 11:40 AM  Addendum: Note to home health agency Please remove foley one week from discharge and continue to apply the nystatin powder to affected area in groin.   May remove foley 05/02/12

## 2012-04-28 ENCOUNTER — Inpatient Hospital Stay (HOSPITAL_COMMUNITY)
Admission: EM | Admit: 2012-04-28 | Discharge: 2012-05-04 | DRG: 689 | Disposition: A | Discharge status: Home / Self Care | Payer: 59 | Attending: Internal Medicine | Admitting: Internal Medicine

## 2012-04-28 ENCOUNTER — Emergency Department (HOSPITAL_COMMUNITY): Payer: 59

## 2012-04-28 ENCOUNTER — Encounter (HOSPITAL_COMMUNITY): Payer: Self-pay | Admitting: Emergency Medicine

## 2012-04-28 DIAGNOSIS — G20A1 Parkinson's disease without dyskinesia, without mention of fluctuations: Secondary | ICD-10-CM | POA: Diagnosis present

## 2012-04-28 DIAGNOSIS — R000 Tachycardia, unspecified: Secondary | ICD-10-CM | POA: Diagnosis present

## 2012-04-28 DIAGNOSIS — G9340 Encephalopathy, unspecified: Secondary | ICD-10-CM

## 2012-04-28 DIAGNOSIS — G20 Parkinson's disease: Secondary | ICD-10-CM | POA: Diagnosis present

## 2012-04-28 DIAGNOSIS — K5900 Constipation, unspecified: Secondary | ICD-10-CM

## 2012-04-28 DIAGNOSIS — S68419A Complete traumatic amputation of unspecified hand at wrist level, initial encounter: Secondary | ICD-10-CM

## 2012-04-28 DIAGNOSIS — I498 Other specified cardiac arrhythmias: Secondary | ICD-10-CM | POA: Diagnosis present

## 2012-04-28 DIAGNOSIS — R569 Unspecified convulsions: Secondary | ICD-10-CM

## 2012-04-28 DIAGNOSIS — J189 Pneumonia, unspecified organism: Secondary | ICD-10-CM

## 2012-04-28 DIAGNOSIS — E669 Obesity, unspecified: Secondary | ICD-10-CM | POA: Diagnosis present

## 2012-04-28 DIAGNOSIS — N390 Urinary tract infection, site not specified: Secondary | ICD-10-CM

## 2012-04-28 DIAGNOSIS — G9341 Metabolic encephalopathy: Secondary | ICD-10-CM | POA: Diagnosis present

## 2012-04-28 DIAGNOSIS — C649 Malignant neoplasm of unspecified kidney, except renal pelvis: Secondary | ICD-10-CM | POA: Diagnosis present

## 2012-04-28 DIAGNOSIS — G40909 Epilepsy, unspecified, not intractable, without status epilepticus: Secondary | ICD-10-CM | POA: Diagnosis present

## 2012-04-28 DIAGNOSIS — R1310 Dysphagia, unspecified: Secondary | ICD-10-CM | POA: Diagnosis present

## 2012-04-28 DIAGNOSIS — F329 Major depressive disorder, single episode, unspecified: Secondary | ICD-10-CM

## 2012-04-28 DIAGNOSIS — Z931 Gastrostomy status: Secondary | ICD-10-CM

## 2012-04-28 DIAGNOSIS — Z905 Acquired absence of kidney: Secondary | ICD-10-CM

## 2012-04-28 DIAGNOSIS — E871 Hypo-osmolality and hyponatremia: Secondary | ICD-10-CM | POA: Diagnosis present

## 2012-04-28 DIAGNOSIS — G809 Cerebral palsy, unspecified: Secondary | ICD-10-CM | POA: Diagnosis present

## 2012-04-28 DIAGNOSIS — E119 Type 2 diabetes mellitus without complications: Secondary | ICD-10-CM

## 2012-04-28 DIAGNOSIS — E860 Dehydration: Secondary | ICD-10-CM

## 2012-04-28 DIAGNOSIS — I10 Essential (primary) hypertension: Secondary | ICD-10-CM

## 2012-04-28 DIAGNOSIS — R509 Fever, unspecified: Secondary | ICD-10-CM | POA: Diagnosis present

## 2012-04-28 LAB — COMPREHENSIVE METABOLIC PANEL
Alkaline Phosphatase: 100 U/L (ref 39–117)
BUN: 31 mg/dL — ABNORMAL HIGH (ref 6–23)
Chloride: 93 mEq/L — ABNORMAL LOW (ref 96–112)
Creatinine, Ser: 0.92 mg/dL (ref 0.50–1.10)
GFR calc Af Amer: 72 mL/min — ABNORMAL LOW (ref >90)
GFR calc non Af Amer: 62 mL/min — ABNORMAL LOW (ref >90)
Glucose, Bld: 200 mg/dL — ABNORMAL HIGH (ref 70–99)
Potassium: 4.9 mEq/L (ref 3.5–5.1)
Total Bilirubin: 0.3 mg/dL (ref 0.3–1.2)

## 2012-04-28 LAB — URINE MICROSCOPIC-ADD ON

## 2012-04-28 LAB — CBC WITH DIFFERENTIAL/PLATELET
Basophils Relative: 0 % (ref 0–1)
Eosinophils Absolute: 0 10*3/uL (ref 0.0–0.7)
HCT: 39.9 % (ref 36.0–46.0)
Hemoglobin: 12.7 g/dL (ref 12.0–15.0)
Lymphs Abs: 2.6 10*3/uL (ref 0.7–4.0)
MCH: 28.7 pg (ref 26.0–34.0)
MCHC: 31.8 g/dL (ref 30.0–36.0)
Monocytes Absolute: 0.7 10*3/uL (ref 0.1–1.0)
Monocytes Relative: 5 % (ref 3–12)
Neutro Abs: 9.7 10*3/uL — ABNORMAL HIGH (ref 1.7–7.7)
RBC: 4.42 MIL/uL (ref 3.87–5.11)

## 2012-04-28 LAB — URINALYSIS, ROUTINE W REFLEX MICROSCOPIC
Glucose, UA: NEGATIVE mg/dL
Protein, ur: NEGATIVE mg/dL
pH: 6.5 (ref 5.0–8.0)

## 2012-04-28 LAB — RAPID URINE DRUG SCREEN, HOSP PERFORMED
Amphetamines: NOT DETECTED
Barbiturates: NOT DETECTED
Tetrahydrocannabinol: NOT DETECTED

## 2012-04-28 LAB — CULTURE, BLOOD (ROUTINE X 2)

## 2012-04-28 LAB — PHENYTOIN LEVEL, FREE AND TOTAL
Phenytoin, Free: <0.5 mg/L (ref 1.0–2.0)
Phenytoin, Total: <2.5 mg/L (ref 10.0–20.0)

## 2012-04-28 LAB — GLUCOSE, CAPILLARY
Glucose-Capillary: 198 mg/dL — ABNORMAL HIGH (ref 70–99)
Glucose-Capillary: 243 mg/dL — ABNORMAL HIGH (ref 70–99)

## 2012-04-28 MED ORDER — INSULIN ASPART 100 UNIT/ML ~~LOC~~ SOLN
0.0000 [IU] | SUBCUTANEOUS | Status: DC
  Administered 2012-04-29 (×4): 3 [IU] via SUBCUTANEOUS
  Administered 2012-04-29 – 2012-04-30 (×3): 5 [IU] via SUBCUTANEOUS
  Administered 2012-04-30 (×2): 3 [IU] via SUBCUTANEOUS
  Administered 2012-04-30: 2 [IU] via SUBCUTANEOUS
  Administered 2012-04-30 (×2): 3 [IU] via SUBCUTANEOUS
  Administered 2012-05-01 (×2): 5 [IU] via SUBCUTANEOUS
  Administered 2012-05-01: 3 [IU] via SUBCUTANEOUS
  Administered 2012-05-01: 5 [IU] via SUBCUTANEOUS
  Administered 2012-05-01: 3 [IU] via SUBCUTANEOUS
  Administered 2012-05-01: 2 [IU] via SUBCUTANEOUS
  Administered 2012-05-02: 3 [IU] via SUBCUTANEOUS
  Administered 2012-05-02: 2 [IU] via SUBCUTANEOUS
  Administered 2012-05-02: 5 [IU] via SUBCUTANEOUS
  Administered 2012-05-02 (×2): 3 [IU] via SUBCUTANEOUS
  Administered 2012-05-02: 5 [IU] via SUBCUTANEOUS
  Administered 2012-05-03: 3 [IU] via SUBCUTANEOUS
  Administered 2012-05-03: 2 [IU] via SUBCUTANEOUS
  Administered 2012-05-03 – 2012-05-04 (×5): 3 [IU] via SUBCUTANEOUS
  Administered 2012-05-04: 5 [IU] via SUBCUTANEOUS
  Administered 2012-05-04 (×2): 3 [IU] via SUBCUTANEOUS
  Administered 2012-05-04: 2 [IU] via SUBCUTANEOUS

## 2012-04-28 MED ORDER — DEXTROSE 5 % IV SOLN
1.0000 g | Freq: Once | INTRAVENOUS | Status: AC
  Administered 2012-04-28: 1 g via INTRAVENOUS
  Filled 2012-04-28: qty 10

## 2012-04-28 MED ORDER — ACETAMINOPHEN 650 MG RE SUPP
650.0000 mg | Freq: Once | RECTAL | Status: AC
  Administered 2012-04-28: 650 mg via RECTAL
  Filled 2012-04-28: qty 1

## 2012-04-28 MED ORDER — ENOXAPARIN SODIUM 40 MG/0.4ML ~~LOC~~ SOLN
40.0000 mg | Freq: Every day | SUBCUTANEOUS | Status: DC
  Administered 2012-04-29 – 2012-05-03 (×6): 40 mg via SUBCUTANEOUS
  Filled 2012-04-28 (×7): qty 0.4

## 2012-04-28 MED ORDER — VANCOMYCIN HCL IN DEXTROSE 1-5 GM/200ML-% IV SOLN
1000.0000 mg | Freq: Once | INTRAVENOUS | Status: AC
  Administered 2012-04-28: 1000 mg via INTRAVENOUS
  Filled 2012-04-28: qty 200

## 2012-04-28 MED ORDER — ACETAMINOPHEN 650 MG RE SUPP
650.0000 mg | Freq: Four times a day (QID) | RECTAL | Status: DC | PRN
  Filled 2012-04-28 (×2): qty 1

## 2012-04-28 MED ORDER — PAROXETINE HCL 20 MG PO TABS
40.0000 mg | ORAL_TABLET | Freq: Every morning | ORAL | Status: DC
  Administered 2012-04-29 – 2012-05-04 (×6): 40 mg
  Filled 2012-04-28 (×6): qty 2

## 2012-04-28 MED ORDER — DEXTROSE 5 % IV SOLN
2.0000 g | Freq: Once | INTRAVENOUS | Status: AC
  Administered 2012-04-28: 2 g via INTRAVENOUS
  Filled 2012-04-28: qty 2

## 2012-04-28 MED ORDER — DIPHENOXYLATE-ATROPINE 2.5-0.025 MG PO TABS
1.0000 | ORAL_TABLET | Freq: Three times a day (TID) | ORAL | Status: DC | PRN
Start: 2012-04-28 — End: 2012-05-04
  Administered 2012-05-01: 1
  Filled 2012-04-28: qty 1

## 2012-04-28 MED ORDER — SODIUM CHLORIDE 0.9 % IV BOLUS (SEPSIS)
1000.0000 mL | Freq: Once | INTRAVENOUS | Status: AC
  Administered 2012-04-28: 1000 mL via INTRAVENOUS

## 2012-04-28 MED ORDER — NALOXONE HCL 1 MG/ML IJ SOLN
1.0000 mg | Freq: Once | INTRAMUSCULAR | Status: AC
  Administered 2012-04-28: 1 mg via INTRAVENOUS
  Filled 2012-04-28: qty 2

## 2012-04-28 MED ORDER — GLUCERNA 1.2 CAL PO LIQD
237.0000 mL | Freq: Every day | ORAL | Status: DC
  Administered 2012-04-28 – 2012-05-01 (×13): 237 mL
  Filled 2012-04-28 (×16): qty 237

## 2012-04-28 MED ORDER — LEVETIRACETAM 100 MG/ML PO SOLN
1000.0000 mg | Freq: Two times a day (BID) | ORAL | Status: DC
  Administered 2012-04-29 – 2012-05-04 (×12): 1000 mg
  Filled 2012-04-28 (×14): qty 10

## 2012-04-28 MED ORDER — NYSTATIN 100000 UNIT/GM EX POWD
Freq: Three times a day (TID) | CUTANEOUS | Status: DC
Start: 2012-04-28 — End: 2012-05-04
  Administered 2012-04-29 – 2012-05-04 (×18): via TOPICAL
  Filled 2012-04-28 (×3): qty 15

## 2012-04-28 MED ORDER — ONDANSETRON HCL 4 MG/2ML IJ SOLN: 4.0000 mg | Freq: Four times a day (QID) | INTRAMUSCULAR | Status: DC | PRN

## 2012-04-28 MED ORDER — ADULT MULTIVITAMIN W/MINERALS CH
1.0000 | ORAL_TABLET | Freq: Every day | ORAL | Status: DC
  Administered 2012-04-29 – 2012-05-04 (×6): 1
  Filled 2012-04-28 (×6): qty 1

## 2012-04-28 MED ORDER — PHENYTOIN SODIUM EXTENDED 100 MG PO CAPS
300.0000 mg | ORAL_CAPSULE | Freq: Every day | ORAL | Status: DC
  Administered 2012-04-29 – 2012-05-01 (×3): 300 mg via ORAL
  Filled 2012-04-28 (×4): qty 3

## 2012-04-28 MED ORDER — FENOFIBRATE 160 MG PO TABS
160.0000 mg | ORAL_TABLET | Freq: Every day | ORAL | Status: DC
  Administered 2012-04-29 – 2012-05-04 (×6): 160 mg via ORAL
  Filled 2012-04-28 (×6): qty 1

## 2012-04-28 MED ORDER — FAMOTIDINE 20 MG PO TABS
20.0000 mg | ORAL_TABLET | Freq: Two times a day (BID) | ORAL | Status: DC
  Administered 2012-04-29 – 2012-05-04 (×12): 20 mg
  Filled 2012-04-28 (×14): qty 1

## 2012-04-28 MED ORDER — ALUM & MAG HYDROXIDE-SIMETH 200-200-20 MG/5ML PO SUSP: 30.0000 mL | Freq: Four times a day (QID) | ORAL | Status: DC | PRN

## 2012-04-28 MED ORDER — HYDROCORTISONE 1 % EX CREA
1.0000 | TOPICAL_CREAM | Freq: Three times a day (TID) | CUTANEOUS | Status: DC
Start: 2012-04-28 — End: 2012-05-04
  Administered 2012-04-29 – 2012-05-04 (×18): 1 via TOPICAL
  Filled 2012-04-28 (×2): qty 28

## 2012-04-28 MED ORDER — SODIUM CHLORIDE 0.9 % IV SOLN
INTRAVENOUS | Status: DC
  Administered 2012-04-28 – 2012-05-02 (×6): via INTRAVENOUS

## 2012-04-28 MED ORDER — ENOXAPARIN SODIUM 30 MG/0.3ML ~~LOC~~ SOLN: 30.0000 mg | SUBCUTANEOUS | Status: DC

## 2012-04-28 MED ORDER — DEXTROSE 5 % IV SOLN: 1.0000 g | INTRAVENOUS | Status: DC

## 2012-04-28 MED ORDER — ONDANSETRON HCL 4 MG PO TABS: 4.0000 mg | ORAL_TABLET | Freq: Four times a day (QID) | ORAL | Status: DC | PRN

## 2012-04-28 MED ORDER — ACETAMINOPHEN 325 MG PO TABS
650.0000 mg | ORAL_TABLET | Freq: Four times a day (QID) | ORAL | Status: DC | PRN
  Administered 2012-04-29 – 2012-05-03 (×6): 650 mg via ORAL
  Filled 2012-04-28 (×6): qty 2

## 2012-04-28 MED ORDER — INSULIN DETEMIR 100 UNIT/ML ~~LOC~~ SOLN
30.0000 [IU] | Freq: Two times a day (BID) | SUBCUTANEOUS | Status: DC
  Administered 2012-04-29 – 2012-05-02 (×8): 30 [IU] via SUBCUTANEOUS
  Filled 2012-04-28 (×9): qty 0.3

## 2012-04-28 MED ORDER — BIOTENE DRY MOUTH MT LIQD
15.0000 mL | Freq: Two times a day (BID) | OROMUCOSAL | Status: DC
  Administered 2012-04-29 – 2012-05-04 (×11): 15 mL via OROMUCOSAL

## 2012-04-28 NOTE — Progress Notes (Signed)
Rx Brief Lovenox note Wt=74 kg , CrCl~60 (N) Adjusted Lovenox to 40mg  daily for DVT prophylaxis  [name], [place] 04/28/2012 9:51 PM.

## 2012-04-28 NOTE — ED Notes (Signed)
ZOX:WRUE<AV> Expected date:<BR> Expected time:<BR> Means of arrival:Ambulance<BR> Comments:<BR> 71yof-pneumonia/altered mental status

## 2012-04-28 NOTE — ED Notes (Signed)
Per EMS: Pt was recently d/c from here w/ lt lower lobe pna.  Has finished abx.  Yesterday afternoon, pt became altered.  Home health called.  Home health nurse called 911.  Pt was alert to verbal on scene.  Pt is now only responsive to painful stimuli.

## 2012-04-28 NOTE — ED Provider Notes (Signed)
History     CSN: [id number]  Arrival date & time 04/28/12  1804   First MD Initiated Contact with Patient 04/28/12 1807      Chief Complaint  Patient presents with  . Altered Mental Status    (Consider location/radiation/quality/duration/timing/severity/associated sxs/prior treatment) Patient is a 72 y.o. female presenting with altered mental status. The history is provided by the EMS personnel (pt has been lethargic for two days with fever). No language interpreter was used.  Altered Mental Status This is a new problem. The current episode started 2 days ago. The problem occurs constantly. The problem has not changed since onset.Pertinent negatives include no abdominal pain. Nothing aggravates the symptoms. Nothing relieves the symptoms.    Past Medical History  Diagnosis Date  . Diabetes mellitus   . Renal disorder   . CP (cerebral palsy)   . Parkinsonism   . Dysphagia   . Recurrent aspiration pneumonia   . Bladder spasms   . Amputation of arm   . Recurrent UTI   . Renal cancer     s/p nephrectomy    Past Surgical History  Procedure Laterality Date  . 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 Peg tube placement    . Knee surgery    . Nephrectomy      No family history on file.  History  Substance Use Topics  . Smoking status: Never Smoker   . Smokeless tobacco: Never Used  . Alcohol Use: No    OB History   Grav Para Term Preterm Abortions TAB SAB Ect Mult Living                  Review of Systems  Unable to perform ROS: Mental status change  HENT: Negative for sinus pressure.   Gastrointestinal: Negative for abdominal pain and diarrhea.  Musculoskeletal: Negative for back pain.  Skin: Negative for rash.  Psychiatric/Behavioral: Positive for altered mental status. Negative for hallucinations.    Allergies  Codeine and Morphine and related  Home Medications   Current Outpatient Rx  Name  Route  Sig  Dispense  Refill  . antiseptic  oral rinse (BIOTENE) LIQD   Mouth Rinse   15 mLs by Mouth Rinse route 2 times daily at 12 noon and 4 pm.   473 mL   0   . clonazePAM (KLONOPIN) 0.5 MG tablet   Per Tube   Place 1 tablet (0.5 mg total) into feeding tube 2 (two) times daily as needed for anxiety.   30 tablet   0   . diphenoxylate-atropine (LOMOTIL) 2.5-0.025 MG per tablet   Per Tube   Place 1 tablet into feeding tube 3 (three) times daily as needed. For diarrhea         . fenofibrate (TRICOR) 145 MG tablet   Per Tube   Place 145 mg into feeding tube at bedtime.          Marland Kitchen HYDROcodone-acetaminophen (NORCO/VICODIN) 5-325 MG per tablet   Per Tube   Place 1 tablet into feeding tube 2 (two) times daily as needed for pain.   30 tablet   0   . hydrocortisone cream 1 %   Topical   Apply 1 application topically 3 (three) times daily. Applied to affected areas.         . insulin aspart (NOVOLOG) 100 UNIT/ML injection   Subcutaneous   Inject 0-15 Units into the skin every 4 (four) hours. Before each  meal 3 times a day, 140-199 - 2 units, 200-250 - 4 units, 251-299 - 6 units,  300-349 - 8 units,  350 or above 10 units.   1 vial   0   . insulin detemir (LEVEMIR) 100 UNIT/ML injection   Subcutaneous   Inject 30 Units into the skin 2 (two) times daily.   10 mL   1   . levETIRAcetam (KEPPRA) 1000 MG tablet   Per Tube   Place 1,000 mg into feeding tube 2 (two) times daily.         . metFORMIN (GLUCOPHAGE) 1000 MG tablet   Per Tube   Place 1,000 mg into feeding tube 2 (two) times daily with a meal.         . metoprolol tartrate (LOPRESSOR) 100 MG tablet   Per Tube   Place 1 tablet (100 mg total) into feeding tube 2 (two) times daily.   60 tablet   0   . Multiple Vitamin (MULTIVITAMIN WITH MINERALS) TABS   Per Tube   Place 1 tablet into feeding tube daily.   30 tablet   1   . Nutritional Supplements (FEEDING SUPPLEMENT, GLUCERNA 1.2 CAL,) LIQD   Per Tube   Place 237 mLs into feeding tube 5 (five)  times daily.   237 mL   0     Please dispense enough for 1 month supply at above ...   . nystatin (MYCOSTATIN/NYSTOP) 100000 UNIT/GM POWD   Topical   Apply topically 3 (three) times daily. Applied to groin rash.         Marland Kitchen PARoxetine (PAXIL) 40 MG tablet   Per Tube   Place 40 mg into feeding tube every morning.          . phenytoin (DILANTIN) 100 MG ER capsule   Per Tube   Place 300 mg into feeding tube daily.         . pregabalin (LYRICA) 150 MG capsule   Per Tube   Place 150 mg into feeding tube 2 (two) times daily.          . ranitidine (ZANTAC) 300 MG tablet   Per Tube   Place 300 mg into feeding tube at bedtime.            BP 137/47  Pulse 110  Temp(Src) 100.9 F (38.3 C) (Core (Comment))  Resp 21  SpO2 93%  Physical Exam  Constitutional: She appears well-developed.  HENT:  Head: Normocephalic and atraumatic.  Eyes: Conjunctivae are normal. No scleral icterus.  Neck: Neck supple. No thyromegaly present.  Cardiovascular: Regular rhythm.  Exam reveals no gallop and no friction rub.   No murmur heard. tachycardia  Pulmonary/Chest: No stridor. She has no wheezes. She has no rales. She exhibits no tenderness.  Abdominal: She exhibits no distension. There is no tenderness. There is no rebound.  Musculoskeletal: She exhibits no edema.  amputation of left hand  Lymphadenopathy:    She has no cervical adenopathy.  Neurological: Coordination normal.  Initially pt only responded to painful stimuli.  Now will answer questions slowly  Skin: No rash noted. No erythema.    ED Course  Procedures (including critical care time)  Labs Reviewed  CBC WITH DIFFERENTIAL - Abnormal; Notable for the following:    WBC 13.0 (*)    Neutro Abs 9.7 (*)    All other components within normal limits  URINALYSIS, ROUTINE W REFLEX MICROSCOPIC - Abnormal; Notable for the following:    APPearance TURBID (*)  Hgb urine dipstick MODERATE (*)    Leukocytes, UA LARGE (*)     All other components within normal limits  COMPREHENSIVE METABOLIC PANEL - Abnormal; Notable for the following:    Sodium 131 (*)    Chloride 93 (*)    Glucose, Bld 200 (*)    BUN 31 (*)    Albumin 3.1 (*)    GFR calc non Af Amer 62 (*)    GFR calc Af Amer 72 (*)    All other components within normal limits  GLUCOSE, CAPILLARY - Abnormal; Notable for the following:    Glucose-Capillary 198 (*)    All other components within normal limits  URINE MICROSCOPIC-ADD ON - Abnormal; Notable for the following:    Squamous Epithelial / LPF FEW (*)    Bacteria, UA MANY (*)    All other components within normal limits  URINE CULTURE  URINE CULTURE  LACTIC ACID, PLASMA  URINE RAPID DRUG SCREEN (HOSP PERFORMED)  PHENYTOIN LEVEL, TOTAL   Dg Chest Port 1 View  04/28/2012  *RADIOLOGY REPORT*  Clinical Data: Short of breath  PORTABLE CHEST - 1 VIEW  Comparison: 04/22/2012  Findings: Progression of bibasilar airspace disease which is most consistent with atelectasis.  There is decreased lung volume. Negative for heart failure or edema.  Small left effusion.  IMPRESSION: Increased bibasilar airspace disease, most consistent with atelectasis.  Small left effusion has developed.   Original Report Authenticated By: Janeece Riggers, M.D.      1. UTI (lower urinary tract infection)   2. Dehydration       MDM          Benny Lennert, MD 04/28/12 2053

## 2012-04-28 NOTE — ED Notes (Signed)
Gave 1mg  narcan.  Pt slightly less lethargic, blinking eyes occasionally.  02 sat 94% on RA

## 2012-04-28 NOTE — H&P (Signed)
[facility] History and Physical  ZANDRIA WOLDT ZOX:[id number] DOB: 01-01-1941 DOA: 04/28/2012  Referring physician: EDP PCP: Dr. Holley Bouche Specialists:   Chief Complaint: Decreased Responsiveness  HPI: Joann MICHNA is a 72 y.o. female with Multiple medical problems who was sent to the ED due to increasing lethargy and decreased responsiveness over the past 2 days.  She began to have fevers and chills today.  Patient was recently hospitalized for HCAP and was continued on Augmentin therapy as an outpatient.  She has severe parkinson's and dysphagia and receives her feeding via a PEG tube.   She is Obtunded and unable to give a history, so her husband was called and he gave the series of events and her medical history.   She has been bedbound for 8 years and lives at home with her husband and received home care.      Review of Systems: Unable to Obtain from the Patient  Past Medical History  Diagnosis Date  . Diabetes mellitus   . Renal disorder   . CP (cerebral palsy)   . Parkinsonism   . Dysphagia   . Recurrent aspiration pneumonia   . Bladder spasms   . Amputation of arm   . Recurrent UTI   . Renal cancer     s/p nephrectomy     Past Surgical History  Procedure Laterality Date  . 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 Peg tube placement    . Knee surgery    . Nephrectomy       Medications:  HOME MEDS: Prior to Admission medications   Medication Sig Start Date End Date Taking? Authorizing Provider  antiseptic oral rinse (BIOTENE) LIQD 15 mLs by Mouth Rinse route 2 times daily at 12 noon and 4 pm. 04/21/12   Leroy Sea, MD  clonazePAM (KLONOPIN) 0.5 MG tablet Place 1 tablet (0.5 mg total) into feeding tube 2 (two) times daily as needed for anxiety. 04/25/12   Penny Pia, MD  diphenoxylate-atropine (LOMOTIL) 2.5-0.025 MG per tablet Place 1 tablet into feeding tube 3 (three) times daily as needed. For diarrhea    Historical  Provider, MD  fenofibrate (TRICOR) 145 MG tablet Place 145 mg into feeding tube at bedtime.     Historical Provider, MD  HYDROcodone-acetaminophen (NORCO/VICODIN) 5-325 MG per tablet Place 1 tablet into feeding tube 2 (two) times daily as needed for pain. 04/25/12   Penny Pia, MD  hydrocortisone cream 1 % Apply 1 application topically 3 (three) times daily. Applied to affected areas.    Historical Provider, MD  insulin aspart (NOVOLOG) 100 UNIT/ML injection Inject 0-15 Units into the skin every 4 (four) hours. Before each meal 3 times a day, 140-199 - 2 units, 200-250 - 4 units, 251-299 - 6 units,  300-349 - 8 units,  350 or above 10 units. 04/21/12   Leroy Sea, MD  insulin detemir (LEVEMIR) 100 UNIT/ML injection Inject 30 Units into the skin 2 (two) times daily. 04/21/12   Leroy Sea, MD  levETIRAcetam (KEPPRA) 1000 MG tablet Place 1,000 mg into feeding tube 2 (two) times daily.    Historical Provider, MD  metFORMIN (GLUCOPHAGE) 1000 MG tablet Place 1,000 mg into feeding tube 2 (two) times daily with a meal.    Historical Provider, MD  metoprolol tartrate (LOPRESSOR) 100 MG tablet Place 1 tablet (100 mg total) into feeding tube 2 (two) times daily. 04/21/12   Leroy Sea, MD  Multiple Vitamin (MULTIVITAMIN WITH MINERALS) TABS Place 1 tablet into feeding tube daily. 04/21/12   Leroy Sea, MD  Nutritional Supplements (FEEDING SUPPLEMENT, GLUCERNA 1.2 CAL,) LIQD Place 237 mLs into feeding tube 5 (five) times daily. 04/25/12   Penny Pia, MD  nystatin (MYCOSTATIN/NYSTOP) 100000 UNIT/GM POWD Apply topically 3 (three) times daily. Applied to groin rash.    Historical Provider, MD  PARoxetine (PAXIL) 40 MG tablet Place 40 mg into feeding tube every morning.     Historical Provider, MD  phenytoin (DILANTIN) 100 MG ER capsule Place 300 mg into feeding tube daily.    Historical Provider, MD  pregabalin (LYRICA) 150 MG capsule Place 150 mg into feeding tube 2 (two) times daily.      Historical Provider, MD  ranitidine (ZANTAC) 300 MG tablet Place 300 mg into feeding tube at bedtime.     Historical Provider, MD    Allergies:  Allergies  Allergen Reactions  . Codeine Nausea And Vomiting  . Morphine And Related Nausea And Vomiting    Social History:   reports that she has never smoked. She has never used smokeless tobacco. She reports that she does not drink alcohol or use illicit drugs.  Family History: Family History  Problem Relation Age of Onset  . CAD Mother   . CAD Mother       Physical Exam:  GEN:  Obtunded Elderly Obese 72 year old Caucasian Female examined  and in no acute distress;  Filed Vitals:   04/28/12 2030 04/28/12 2045 04/28/12 2100 04/28/12 2145  BP: 137/47 148/52 135/50 154/74  Pulse: 110 112 117 114  Temp: 100.9 F (38.3 C) 101.3 F (38.5 C) 101.5 F (38.6 C) 100 F (37.8 C)  TempSrc:    Axillary  Resp: 21 22 18 18   SpO2: 93% 95% 94% 93%   Blood pressure 154/74, pulse 114, temperature 100 F (37.8 C), temperature source Axillary, resp. rate 18, SpO2 93.00%. PSYCH: SHe is Obtunded;  HEENT: Normocephalic and Atraumatic, Mucous membranes pink; PERRLA; EOM intact; Fundi:  Benign;  No scleral icterus, Nares: Patent, Oropharynx: Clear, Fair Dentition, Neck:  FROM, no cervical lymphadenopathy nor thyromegaly or carotid bruit; no JVD; Breasts:: Not examined CHEST WALL: No tenderness CHEST: Normal respiration, clear to auscultation bilaterally HEART: Regular rate and rhythm; no murmurs rubs or gallops BACK: No kyphosis or scoliosis; no CVA tenderness ABDOMEN: Positive Bowel Sounds, Obese, soft non-tender; no masses, no organomegaly.   Rectal Exam: Not done EXTREMITIES: + Atrophy of Both lower Extremities,  No cyanosis, clubbing or edema; no ulcerations. 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    CNS:  Obtunded, Generalized weakness, Withdraws to painful stimuli,     Labs & Imaging Results for orders placed during the hospital encounter of 04/28/12 (from the past 48 hour(s))  GLUCOSE, CAPILLARY     Status: Abnormal   Collection Time    04/28/12  6:18 PM      Result Value Range   Glucose-Capillary 198 (*) 70 - 99 mg/dL   Comment 1 Notify RN     Comment 2 Documented in Chart    URINALYSIS, ROUTINE W REFLEX MICROSCOPIC     Status: Abnormal   Collection Time    04/28/12  7:03 PM      Result Value Range   Color, Urine YELLOW  YELLOW   APPearance TURBID (*) CLEAR  Specific Gravity, Urine 1.015  1.005 - 1.030   pH 6.5  5.0 - 8.0   Glucose, UA NEGATIVE  NEGATIVE mg/dL   Hgb urine dipstick MODERATE (*) NEGATIVE   Bilirubin Urine NEGATIVE  NEGATIVE   Ketones, ur NEGATIVE  NEGATIVE mg/dL   Protein, ur NEGATIVE  NEGATIVE mg/dL   Urobilinogen, UA 0.2  0.0 - 1.0 mg/dL   Nitrite NEGATIVE  NEGATIVE   Leukocytes, UA LARGE (*) NEGATIVE  URINE MICROSCOPIC-ADD ON     Status: Abnormal   Collection Time    04/28/12  7:03 PM      Result Value Range   Squamous Epithelial / LPF FEW (*) RARE   WBC, UA TOO NUMEROUS TO COUNT  <3 WBC/hpf   RBC / HPF FIELD OBSCURED BY WBC'S  <3 RBC/hpf   Bacteria, UA MANY (*) RARE   Urine-Other MANY YEAST    CBC WITH DIFFERENTIAL     Status: Abnormal   Collection Time    04/28/12  7:18 PM      Result Value Range   WBC 13.0 (*) 4.0 - 10.5 K/uL   RBC 4.42  3.87 - 5.11 MIL/uL   Hemoglobin 12.7  12.0 - 15.0 g/dL   HCT 16.1  09.6 - 04.5 %   MCV 90.3  78.0 - 100.0 fL   MCH 28.7  26.0 - 34.0 pg   MCHC 31.8  30.0 - 36.0 g/dL   RDW 40.9  81.1 - 91.4 %   Platelets 325  150 - 400 K/uL   Neutrophils Relative 75  43 - 77 %   Neutro Abs 9.7 (*) 1.7 - 7.7 K/uL   Lymphocytes Relative 20  12 - 46 %   Lymphs Abs 2.6  0.7 - 4.0 K/uL   Monocytes Relative 5  3 - 12 %   Monocytes Absolute 0.7  0.1 - 1.0 K/uL   Eosinophils Relative 0  0 - 5 %   Eosinophils Absolute 0.0  0.0 -  0.7 K/uL   Basophils Relative 0  0 - 1 %   Basophils Absolute 0.0  0.0 - 0.1 K/uL  LACTIC ACID, PLASMA     Status: None   Collection Time    04/28/12  7:18 PM      Result Value Range   Lactic Acid, Venous 1.4  0.5 - 2.2 mmol/L  COMPREHENSIVE METABOLIC PANEL     Status: Abnormal   Collection Time    04/28/12  7:18 PM      Result Value Range   Sodium 131 (*) 135 - 145 mEq/L   Potassium 4.9  3.5 - 5.1 mEq/L   Chloride 93 (*) 96 - 112 mEq/L   CO2 25  19 - 32 mEq/L   Glucose, Bld 200 (*) 70 - 99 mg/dL   BUN 31 (*) 6 - 23 mg/dL   Creatinine, Ser 7.82  0.50 - 1.10 mg/dL   Calcium 9.9  8.4 - 95.6 mg/dL   Total Protein 8.2  6.0 - 8.3 g/dL   Albumin 3.1 (*) 3.5 - 5.2 g/dL   AST 29  0 - 37 U/L   ALT 30  0 - 35 U/L   Alkaline Phosphatase 100  39 - 117 U/L   Total Bilirubin 0.3  0.3 - 1.2 mg/dL   GFR calc non Af Amer 62 (*) >90 mL/min   GFR calc Af Amer 72 (*) >90 mL/min   Comment:            The  eGFR has been calculated     using the CKD EPI equation.     This calculation has not been     validated in all clinical     situations.     eGFR's persistently     <90 mL/min signify     possible Chronic Kidney Disease.  URINE RAPID DRUG SCREEN (HOSP PERFORMED)     Status: None   Collection Time    04/28/12  7:33 PM      Result Value Range   Opiates NONE DETECTED  NONE DETECTED   Cocaine NONE DETECTED  NONE DETECTED   Benzodiazepines NONE DETECTED  NONE DETECTED   Amphetamines NONE DETECTED  NONE DETECTED   Tetrahydrocannabinol NONE DETECTED  NONE DETECTED   Barbiturates NONE DETECTED  NONE DETECTED   Comment:            DRUG SCREEN FOR MEDICAL PURPOSES     ONLY.  IF CONFIRMATION IS NEEDED     FOR ANY PURPOSE, NOTIFY LAB     WITHIN 5 DAYS.                LOWEST DETECTABLE LIMITS     FOR URINE DRUG SCREEN     Drug Class       Cutoff (ng/mL)     Amphetamine      1000     Barbiturate      200     Benzodiazepine   200     Tricyclics       300     Opiates          300     Cocaine           300     THC              50    Radiological Exams on Admission: Dg Chest Port 1 View  04/28/2012  *RADIOLOGY REPORT*  Clinical Data: Short of breath  PORTABLE CHEST - 1 VIEW  Comparison: 04/22/2012  Findings: Progression of bibasilar airspace disease which is most consistent with atelectasis.  There is decreased lung volume. Negative for heart failure or edema.  Small left effusion.  IMPRESSION: Increased bibasilar airspace disease, most consistent with atelectasis.  Small left effusion has developed.   Original Report Authenticated By: Janeece Riggers, M.D.     EKG: Sinus Tachycardia  Assessment/Plan Principal Problem:   Acute encephalopathy Active Problems:   Fever   Diabetes mellitus   Sinus tachycardia   Parkinson's disease   Hyponatremia   Dehydration  1.   Acute Encephalopathy-  May be due to Fever and recurrence of PNA, and possible UTI or Seizure,  Neuro checks, ordered. Slight improvement in ED after hydration.     2.   Fever-  Recent HCAP, went home on Augmentin Rx,  Has PEG tube and High risk of Aspiration even on oral secretions.   Blood cultures sent,  IV Antibiotics of                                     Vancomycin and Cefepime started.   Continue IVFS, and Anti-pyretics PRN.     3.   Diabetes Mellitus-  Monitor Gluconse levels q 4 hours, SSI coverage PRN.     4.   Sinus Tachycardia-   Should Improve with Hydration.  Monitor.     5.   Parkinson's Disease-  No Changes.     6.   Hyponatremia- Mild, should improve with NSS IVFs, Monitor Na+ Levels.     7.  Dehydration-   Should improve with Hydration.   Monitor BUN/Cr.        Code Status:    FULL CODE Family Communication:    Discussed with Husband Over Telephone  [phone].    Disposition Plan:   Return to Home on Discharge  Time spent:   60 Minutes  Ron Parker [facility] Pager [phone]  If 7PM-7AM, please contact night-coverage [email or web address] Password [facility] 04/28/2012, 10:25  PM

## 2012-04-28 NOTE — ED Notes (Signed)
IV team paged to place IV access.

## 2012-04-28 NOTE — ED Notes (Signed)
[name] called, states he is patients husband. His cell phone number is [phone], home number is [phone].

## 2012-04-28 NOTE — ED Notes (Signed)
Rocephin infusing to 20g R FA.

## 2012-04-29 ENCOUNTER — Encounter (HOSPITAL_COMMUNITY): Payer: Self-pay

## 2012-04-29 DIAGNOSIS — N390 Urinary tract infection, site not specified: Principal | ICD-10-CM

## 2012-04-29 LAB — BASIC METABOLIC PANEL
BUN: 27 mg/dL — ABNORMAL HIGH (ref 6–23)
Calcium: 9 mg/dL (ref 8.4–10.5)
GFR calc Af Amer: >90 mL/min (ref >90)
GFR calc non Af Amer: 84 mL/min — ABNORMAL LOW (ref >90)
Glucose, Bld: 274 mg/dL — ABNORMAL HIGH (ref 70–99)
Potassium: 4.3 mEq/L (ref 3.5–5.1)

## 2012-04-29 LAB — GLUCOSE, CAPILLARY
Glucose-Capillary: 232 mg/dL — ABNORMAL HIGH (ref 70–99)
Glucose-Capillary: 256 mg/dL — ABNORMAL HIGH (ref 70–99)
Glucose-Capillary: 258 mg/dL — ABNORMAL HIGH (ref 70–99)

## 2012-04-29 LAB — CBC
MCH: 29.6 pg (ref 26.0–34.0)
MCHC: 33.1 g/dL (ref 30.0–36.0)
Platelets: 226 10*3/uL (ref 150–400)
RDW: 15.3 % (ref 11.5–15.5)

## 2012-04-29 LAB — HEMOGLOBIN A1C
Hgb A1c MFr Bld: 8.3 % — ABNORMAL HIGH (ref <5.7)
Mean Plasma Glucose: 192 mg/dL — ABNORMAL HIGH (ref <117)

## 2012-04-29 MED ORDER — DEXTROSE 5 % IV SOLN
1.0000 g | Freq: Three times a day (TID) | INTRAVENOUS | Status: DC
  Administered 2012-04-29 – 2012-05-02 (×10): 1 g via INTRAVENOUS
  Filled 2012-04-29 (×11): qty 1

## 2012-04-29 MED ORDER — HYDROCODONE-ACETAMINOPHEN 5-325 MG PO TABS: 1.0000 | ORAL_TABLET | Freq: Four times a day (QID) | ORAL | Status: DC | PRN

## 2012-04-29 MED ORDER — VANCOMYCIN HCL 1000 MG IV SOLR
750.0000 mg | Freq: Two times a day (BID) | INTRAVENOUS | Status: DC
  Administered 2012-04-29 – 2012-04-30 (×4): 750 mg via INTRAVENOUS
  Filled 2012-04-29 (×5): qty 750

## 2012-04-29 MED ORDER — HYDROCODONE-ACETAMINOPHEN 5-325 MG PO TABS
1.0000 | ORAL_TABLET | Freq: Two times a day (BID) | ORAL | Status: DC | PRN
  Administered 2012-04-29 – 2012-05-02 (×4): 1
  Filled 2012-04-29 (×4): qty 1

## 2012-04-29 NOTE — Progress Notes (Signed)
INITIAL NUTRITION ASSESSMENT  DOCUMENTATION CODES Per approved criteria  -Not Applicable   INTERVENTION: 1. Recommend continuing current tube feeding regimen of Glucerna 1.2 via PEG at 5 cans per day which provides 1425 kcal, 71 g protein, 960 mL of free water. (This meets 95% of pt's estimated calorie needs and 95% of estimated protein needs) 2. Recommend free water flushes of 30 mL before and after each bolus. This will provide an extra 300 mL of free water. If IV fluid is d/c, recommend changing flushes to 60 mL before and after each bolus feeding.  3. Adult enteral nutrition protocol initiated.  4. Pt will continue to be monitored by dietitian.    NUTRITION DIAGNOSIS: Inadequate oral intake related to history of dysphagia and aspiration pneumonia as evidenced by pt with PEG using TF as main source of nutrition.   Goal: Pt to meet >/= 90% of their estimated nutrition needs  Monitor:  Weight, labs, tolerance of TF, blood glucose  Reason for Assessment: TF assessment  72 y.o. female  Admitting Dx: Acute encephalopathy  ASSESSMENT: Pt is a readmit who was discharged from the hospital on 3/15, readmitted on 3/16, discharged on 3/19, and readmitted on 3/22. Pt has a history of CP, possible MR, Parkinsonism, T2DM, renal cancer, dysphagia with recurrent aspiration pneumonia, and bladder spasms. Pt switched from Jevity 1.2 to Glucerna 1.2 at 5 cans per day on 3/17. Per nurse, pt has tolerated this very well with little to no residual and normal bowel movements. Weight has not dropped since 3/17. Pt is a long-term home tube feeder who has been bed-bound for the past 8 years. She was admitted to the hospital for AMS on 3/22 and was found to have a UTI.  Height: Ht Readings from Last 1 Encounters:  04/29/12 5\' 3"  (1.6 m)    Weight: Wt Readings from Last 1 Encounters:  04/29/12 162 lb 4.8 oz (73.619 kg)    Ideal Body Weight: 52.4 kg  % Ideal Body Weight: 140%  Wt Readings from  Last 10 Encounters:  04/29/12 162 lb 4.8 oz (73.619 kg)  04/19/12 163 lb 2.3 oz (74 kg)  03/05/12 172 lb 11.2 oz (78.336 kg)    Usual Body Weight: unknown  % Usual Body Weight: unknown  BMI:  Body mass index is 28.76 kg/(m^2).  Estimated Nutritional Needs: Kcal: 1500-1700 Protein: 75-90 g Fluid: 1.5-1.7 L/d  Skin: WNL, PEG tube  Diet Order: NPO  EDUCATION NEEDS: -No education needs identified at this time   Intake/Output Summary (Last 24 hours) at 04/29/12 1342 Last data filed at 04/29/12 0800  Gross per 24 hour  Intake    320 ml  Output   1650 ml  Net  -1330 ml    Last BM: none documented   Labs:   Recent Labs Lab 04/25/12 0445 04/28/12 1918 04/29/12 0535  NA 137 131* 135  K 3.9 4.9 4.3  CL 101 93* 101  CO2 24 25 20   BUN 12 31* 27*  CREATININE 0.49* 0.92 0.75  CALCIUM 9.6 9.9 9.0  GLUCOSE 132* 200* 274*    CBG (last 3)   Recent Labs  04/29/12 0345 04/29/12 0731 04/29/12 1214  GLUCAP 232* 256* 266*    Scheduled Meds: . antiseptic oral rinse  15 mL Mouth Rinse q12n4p  . ceFEPime (MAXIPIME) IV  1 g Intravenous Q8H  . enoxaparin (LOVENOX) injection  40 mg Subcutaneous QHS  . famotidine  20 mg Per Tube BID  . feeding supplement (GLUCERNA 1.2  CAL)  237 mL Per Tube 5 X Daily  . fenofibrate  160 mg Oral Daily  . hydrocortisone cream  1 application Topical TID  . insulin aspart  0-9 Units Subcutaneous Q4H  . insulin detemir  30 Units Subcutaneous BID  . levETIRAcetam  1,000 mg Per Tube BID  . multivitamin with minerals  1 tablet Per Tube Daily  . nystatin   Topical TID  . PARoxetine  40 mg Per Tube q morning - 10a  . phenytoin  300 mg Oral Daily  . vancomycin  750 mg Intravenous BID    Continuous Infusions: . sodium chloride 75 mL/hr at 04/28/12 2338    Past Medical History  Diagnosis Date  . Diabetes mellitus   . Renal disorder   . CP (cerebral palsy)   . Parkinsonism   . Dysphagia   . Recurrent aspiration pneumonia   . Bladder  spasms   . Amputation of arm   . Recurrent UTI   . Renal cancer     s/p nephrectomy    Past Surgical History  Procedure Laterality Date  . 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 Peg tube placement    . Knee surgery    . Nephrectomy      Ebbie Latus RD, LDN

## 2012-04-29 NOTE — Progress Notes (Addendum)
[facility] PROGRESS NOTE  [name]:[id number] DOB: [birth year] DOA: 04/28/2012 PCP: No primary provider on file.  Assessment/Plan: 1. Acute Encephalopathy -Likely secondary to UTI, continue current abx pending culture - mentation Clinically improved. 2. Fever/UTI -As above, reports lower abd. Pain -afebrile on current abx 3.Recent HCAP -s/p full course of abx -CXR more c/w atx, will add incentive spirometry -follow and deescalate abx pending clinical course 4. Diabetes Mellitus-  -continue accucheck and  SSI coverage.  4. Sinus Tachycardia- Improved with Hydration. Monitor.  5. Parkinson's Disease- stable.  6. Hyponatremia- resolved with hydration 7. Dehydration- resolved 8.Seizure disorder -continue outpt meds  Code Status:full Family Communication: directly with pt at bedside Disposition Plan: to home when stable   Consultants:  none  Procedures:  none  Antibiotics:  Cefepime and Vanc started on 3/22  HPI/Subjective: States feeling bettere this am, reports lower abd pain. No seizures reported per nsg  Objective: Filed Vitals:   04/29/12 0154 04/29/12 0237 04/29/12 0627 04/29/12 1005  BP: 135/57  145/69 142/68  Pulse: 109  111 102  Temp: 98.7 F (37.1 C)  99.3 F (37.4 C) 99.5 F (37.5 C)  TempSrc: Axillary  Oral Oral  Resp: 16  18   Height:  5\' 3"  (1.6 m)    Weight:  73.936 kg (163 lb) 73.619 kg (162 lb 4.8 oz)   SpO2: 98%  96% 95%    Intake/Output Summary (Last 24 hours) at 04/29/12 1036 Last data filed at 04/29/12 9562  Gross per 24 hour  Intake      0 ml  Output   1650 ml  Net  -1650 ml   Filed Weights   04/29/12 0237 04/29/12 0627  Weight: 73.936 kg (163 lb) 73.619 kg (162 lb 4.8 oz)    Exam:   General:  Sleepy but easily aroused and answers appropriately  Cardiovascular: RRR, nl S1S2  Respiratory: decreased BS at bases, no wheezes, no rhonchi  Abdomen: soft +BS, suprapubic tenderness, no rebound, PEG  intact  Extremities: no cyanosis and no edema  Data Reviewed: Basic Metabolic Panel:  Recent Labs Lab 04/23/12 0745 04/24/12 0500 04/25/12 0445 04/28/12 1918 04/29/12 0535  NA 136 139 137 131* 135  K 4.3 4.4 3.9 4.9 4.3  CL 101 103 101 93* 101  CO2 25 27 24 25 20   GLUCOSE 165* 140* 132* 200* 274*  BUN 16 14 12  31* 27*  CREATININE 0.50 0.52 0.49* 0.92 0.75  CALCIUM 9.2 9.9 9.6 9.9 9.0   Liver Function Tests:  Recent Labs Lab 04/22/12 1309 04/28/12 1918  AST 18 29  ALT 14 30  ALKPHOS 70 100  BILITOT 0.1* 0.3  PROT 6.7 8.2  ALBUMIN 2.7* 3.1*    Recent Labs Lab 04/22/12 1309  LIPASE 38   No results found for this basename: AMMONIA,  in the last 168 hours CBC:  Recent Labs Lab 04/22/12 1309 04/23/12 0745 04/24/12 0500 04/25/12 0445 04/28/12 1918 04/29/12 0535  WBC 9.8 7.2 7.5 8.0 13.0* 7.4  NEUTROABS 6.2  --   --   --  9.7*  --   HGB 10.8* 10.9* 11.0* 11.3* 12.7 9.7*  HCT 34.8* 35.1* 35.5* 36.1 39.9 29.3*  MCV 92.1 91.6 92.2 92.3 90.3 89.3  PLT 367 318 322 288 325 226   Cardiac Enzymes:  Recent Labs Lab 04/22/12 1309  TROPONINI <0.30   BNP (last 3 results) No results found for this basename: PROBNP,  in the last 8760 hours CBG:  Recent Labs  Lab 04/25/12 1620 04/28/12 1818 04/28/12 2352 04/29/12 0345 04/29/12 0731  GLUCAP 151* 198* 243* 232* 256*    Recent Results (from the past 240 hour(s))  CULTURE, BLOOD (ROUTINE X 2)     Status: None   Collection Time    04/22/12  1:24 PM      Result Value Range Status   Specimen Description BLOOD RIGHT ARM   Final   Special Requests BOTTLES DRAWN AEROBIC AND ANAEROBIC 8 CC EA   Final   Culture  Setup Time 04/22/2012 20:12   Final   Culture NO GROWTH 5 DAYS   Final   Report Status 04/28/2012 FINAL   Final  CULTURE, BLOOD (ROUTINE X 2)     Status: None   Collection Time    04/22/12  1:34 PM      Result Value Range Status   Specimen Description BLOOD RIGHT ARM   Final   Special Requests  BOTTLES DRAWN AEROBIC AND ANAEROBIC 5 CC EA   Final   Culture  Setup Time 04/22/2012 20:11   Final   Culture NO GROWTH 5 DAYS   Final   Report Status 04/28/2012 FINAL   Final  URINE CULTURE     Status: None   Collection Time    04/22/12  2:02 PM      Result Value Range Status   Specimen Description URINE, CLEAN CATCH   Final   Special Requests NONE   Final   Culture  Setup Time 04/22/2012 20:30   Final   Colony Count 50,000 COLONIES/ML   Final   Culture YEAST   Final   Report Status 04/24/2012 FINAL   Final  MRSA PCR SCREENING     Status: None   Collection Time    04/22/12  4:46 PM      Result Value Range Status   MRSA by PCR NEGATIVE  NEGATIVE Final   Comment:            The GeneXpert MRSA Assay (FDA     approved for NASAL specimens     only), is one component of a     comprehensive MRSA colonization     surveillance program. It is not     intended to diagnose MRSA     infection nor to guide or     monitor treatment for     MRSA infections.     Studies: Dg Chest Port 1 View  04/28/2012  *RADIOLOGY REPORT*  Clinical Data: Short of breath  PORTABLE CHEST - 1 VIEW  Comparison: 04/22/2012  Findings: Progression of bibasilar airspace disease which is most consistent with atelectasis.  There is decreased lung volume. Negative for heart failure or edema.  Small left effusion.  IMPRESSION: Increased bibasilar airspace disease, most consistent with atelectasis.  Small left effusion has developed.   Original Report Authenticated By: Janeece Riggers, M.D.     Scheduled Meds: . antiseptic oral rinse  15 mL Mouth Rinse q12n4p  . ceFEPime (MAXIPIME) IV  1 g Intravenous Q8H  . enoxaparin (LOVENOX) injection  40 mg Subcutaneous QHS  . famotidine  20 mg Per Tube BID  . feeding supplement (GLUCERNA 1.2 CAL)  237 mL Per Tube 5 X Daily  . fenofibrate  160 mg Oral Daily  . hydrocortisone cream  1 application Topical TID  . insulin aspart  0-9 Units Subcutaneous Q4H  . insulin detemir  30 Units  Subcutaneous BID  . levETIRAcetam  1,000 mg Per Tube BID  . multivitamin  with minerals  1 tablet Per Tube Daily  . nystatin   Topical TID  . PARoxetine  40 mg Per Tube q morning - 10a  . phenytoin  300 mg Oral Daily  . vancomycin  750 mg Intravenous BID   Continuous Infusions: . sodium chloride 75 mL/hr at 04/28/12 2338    Principal Problem:   Acute encephalopathy Active Problems:   Fever   Diabetes mellitus   Sinus tachycardia   Parkinson's disease   Hyponatremia    Time spent:>76mins    Kela Millin  [facility] Pager [phone] If 7PM-7AM, please contact night-coverage at [email or web address], password [facility] 04/29/2012, 10:36 AM  LOS: 1 day

## 2012-04-29 NOTE — Progress Notes (Signed)
ANTIBIOTIC CONSULT NOTE - INITIAL  Pharmacy Consult for vancomycin/cefepime Indication: pneumonia  Allergies  Allergen Reactions  . Codeine Nausea And Vomiting  . Morphine And Related Nausea And Vomiting    Patient Measurements: Height: 5\' 3"  (160 cm) Weight: 163 lb (73.936 kg) IBW/kg (Calculated) : 52.4 Adjusted Body Weight:   Vital Signs: Temp: 98.7 F (37.1 C) (03/23 0154) Temp src: Axillary (03/23 0154) BP: 135/57 mmHg (03/23 0154) Pulse Rate: 109 (03/23 0154) Intake/Output from previous day: 03/22 0701 - 03/23 0700 In: -  Out: 800 [Urine:800] Intake/Output from this shift: Total I/O In: -  Out: 800 [Urine:800]  Labs:  Recent Labs  04/28/12 1918  WBC 13.0*  HGB 12.7  PLT 325  CREATININE 0.92   Estimated Creatinine Clearance: 55.6 ml/min (by C-G formula based on Cr of 0.92). No results found for this basename: VANCOTROUGH, Leodis Binet, VANCORANDOM, GENTTROUGH, GENTPEAK, GENTRANDOM, TOBRATROUGH, TOBRAPEAK, TOBRARND, AMIKACINPEAK, AMIKACINTROU, AMIKACIN,  in the last 72 hours   Microbiology: Recent Results (from the past 720 hour(s))  CULTURE, BLOOD (ROUTINE X 2)     Status: None   Collection Time    04/08/12  8:30 AM      Result Value Range Status   Specimen Description BLOOD RIGHT ANTECUBITAL   Final   Special Requests BOTTLES DRAWN AEROBIC AND ANAEROBIC 4 CC EA   Final   Culture  Setup Time 04/08/2012 15:22   Final   Culture NO GROWTH 5 DAYS   Final   Report Status 04/14/2012 FINAL   Final  URINE CULTURE     Status: None   Collection Time    04/08/12  8:52 AM      Result Value Range Status   Specimen Description URINE, CATHETERIZED   Final   Special Requests NONE   Final   Culture  Setup Time 04/08/2012 16:32   Final   Colony Count NO GROWTH   Final   Culture NO GROWTH   Final   Report Status 04/09/2012 FINAL   Final  CULTURE, BLOOD (ROUTINE X 2)     Status: None   Collection Time    04/08/12  9:11 AM      Result Value Range Status   Specimen  Description BLOOD RIGHT HAND  1 ML IN [facility] BOTTLE   Final   Special Requests NONE   Final   Culture  Setup Time 04/08/2012 15:22   Final   Culture     Final   Value: STAPHYLOCOCCUS SPECIES (COAGULASE NEGATIVE)     Note: THE SIGNIFICANCE OF ISOLATING THIS ORGANISM FROM A SINGLE SET OF BLOOD CULTURES WHEN MULTIPLE SETS ARE DRAWN IS UNCERTAIN. PLEASE NOTIFY THE MICROBIOLOGY DEPARTMENT WITHIN ONE WEEK IF SPECIATION AND SENSITIVITIES ARE REQUIRED.     Note: Gram Stain Report Called to,Read Back By and Verified With: KRISTY SYKES ON 04/09/2012 AT 9:33P BY WILEJ   Report Status 04/12/2012 FINAL   Final  MRSA PCR SCREENING     Status: None   Collection Time    04/08/12 11:43 AM      Result Value Range Status   MRSA by PCR NEGATIVE  NEGATIVE Final   Comment:            The GeneXpert MRSA Assay (FDA     approved for NASAL specimens     only), is one component of a     comprehensive MRSA colonization     surveillance program. It is not     intended to diagnose MRSA  infection nor to guide or     monitor treatment for     MRSA infections.  CULTURE, BLOOD (ROUTINE X 2)     Status: None   Collection Time    04/10/12  8:10 AM      Result Value Range Status   Specimen Description BLOOD LEFT ARM   Final   Special Requests BOTTLES DRAWN AEROBIC AND ANAEROBIC 5CC   Final   Culture  Setup Time 04/10/2012 13:46   Final   Culture NO GROWTH 5 DAYS   Final   Report Status 04/16/2012 FINAL   Final  CULTURE, BLOOD (ROUTINE X 2)     Status: None   Collection Time    04/10/12  8:20 AM      Result Value Range Status   Specimen Description BLOOD LEFT ARM   Final   Special Requests BOTTLES DRAWN AEROBIC ONLY 1CC   Final   Culture  Setup Time 04/10/2012 13:46   Final   Culture NO GROWTH 5 DAYS   Final   Report Status 04/16/2012 FINAL   Final  URINE CULTURE     Status: None   Collection Time    04/16/12  7:21 PM      Result Value Range Status   Specimen Description URINE, CATHETERIZED   Final   Special  Requests levaquin (d/c'ed) Normal   Final   Culture  Setup Time 04/17/2012 03:02   Final   Colony Count NO GROWTH   Final   Culture NO GROWTH   Final   Report Status 04/17/2012 FINAL   Final  CULTURE, BLOOD (ROUTINE X 2)     Status: None   Collection Time    04/16/12 10:00 PM      Result Value Range Status   Specimen Description BLOOD LEFT ARM   Final   Special Requests BOTTLES DRAWN AEROBIC AND ANAEROBIC   Final   Culture  Setup Time 04/17/2012 02:54   Final   Culture NO GROWTH 5 DAYS   Final   Report Status 04/23/2012 FINAL   Final  CULTURE, BLOOD (ROUTINE X 2)     Status: None   Collection Time    04/16/12 10:19 PM      Result Value Range Status   Specimen Description BLOOD LEFT   Final   Special Requests BOTTLES DRAWN AEROBIC AND ANAEROBIC   Final   Culture  Setup Time 04/17/2012 02:54   Final   Culture NO GROWTH 5 DAYS   Final   Report Status 04/23/2012 FINAL   Final  CULTURE, BLOOD (ROUTINE X 2)     Status: None   Collection Time    04/22/12  1:24 PM      Result Value Range Status   Specimen Description BLOOD RIGHT ARM   Final   Special Requests BOTTLES DRAWN AEROBIC AND ANAEROBIC 8 CC EA   Final   Culture  Setup Time 04/22/2012 20:12   Final   Culture NO GROWTH 5 DAYS   Final   Report Status 04/28/2012 FINAL   Final  CULTURE, BLOOD (ROUTINE X 2)     Status: None   Collection Time    04/22/12  1:34 PM      Result Value Range Status   Specimen Description BLOOD RIGHT ARM   Final   Special Requests BOTTLES DRAWN AEROBIC AND ANAEROBIC 5 CC EA   Final   Culture  Setup Time 04/22/2012 20:11   Final   Culture NO GROWTH  5 DAYS   Final   Report Status 04/28/2012 FINAL   Final  URINE CULTURE     Status: None   Collection Time    04/22/12  2:02 PM      Result Value Range Status   Specimen Description URINE, CLEAN CATCH   Final   Special Requests NONE   Final   Culture  Setup Time 04/22/2012 20:30   Final   Colony Count 50,000 COLONIES/ML   Final   Culture YEAST    Final   Report Status 04/24/2012 FINAL   Final  MRSA PCR SCREENING     Status: None   Collection Time    04/22/12  4:46 PM      Result Value Range Status   MRSA by PCR NEGATIVE  NEGATIVE Final   Comment:            The GeneXpert MRSA Assay (FDA     approved for NASAL specimens     only), is one component of a     comprehensive MRSA colonization     surveillance program. It is not     intended to diagnose MRSA     infection nor to guide or     monitor treatment for     MRSA infections.    Medical History: Past Medical History  Diagnosis Date  . Diabetes mellitus   . Renal disorder   . CP (cerebral palsy)   . Parkinsonism   . Dysphagia   . Recurrent aspiration pneumonia   . Bladder spasms   . Amputation of arm   . Recurrent UTI   . Renal cancer     s/p nephrectomy    Medications:  Anti-infectives   Start     Dose/Rate Route Frequency Ordered Stop   04/29/12 2000  cefTRIAXone (ROCEPHIN) 1 g in dextrose 5 % 50 mL IVPB  Status:  Discontinued     1 g 100 mL/hr over 30 Minutes Intravenous Every 24 hours 04/28/12 2147 04/28/12 2157   04/29/12 1000  vancomycin (VANCOCIN) 750 mg in sodium chloride 0.9 % 150 mL IVPB     750 mg 150 mL/hr over 60 Minutes Intravenous 2 times daily 04/29/12 0239     04/29/12 0600  ceFEPIme (MAXIPIME) 1 g in dextrose 5 % 50 mL IVPB     1 g 100 mL/hr over 30 Minutes Intravenous 3 times per day 04/29/12 0239     04/28/12 2215  vancomycin (VANCOCIN) IVPB 1000 mg/200 mL premix     1,000 mg 200 mL/hr over 60 Minutes Intravenous  Once 04/28/12 2203 04/29/12 0000   04/28/12 2215  ceFEPIme (MAXIPIME) 2 g in dextrose 5 % 50 mL IVPB     2 g 100 mL/hr over 30 Minutes Intravenous  Once 04/28/12 2203 04/28/12 2330   04/28/12 2015  cefTRIAXone (ROCEPHIN) 1 g in dextrose 5 % 50 mL IVPB     1 g 120 mL/hr over 30 Minutes Intravenous  Once 04/28/12 1955 04/28/12 2106     Assessment: Patient with PNA.  First dose of antibiotics already given in ED.  Goal of  Therapy:  Vancomycin trough level 15-20 mcg/ml Cefepime dosed based on patient weight and renal function   Plan:  Measure antibiotic drug levels at steady state Follow up culture results Vancomycin 750mg  iv q12hr, cefepime 1gm iv q8hr  Darlina Guys, Angalena Cousineau Crowford 04/29/2012,2:40 AM

## 2012-04-30 DIAGNOSIS — R569 Unspecified convulsions: Secondary | ICD-10-CM

## 2012-04-30 DIAGNOSIS — R509 Fever, unspecified: Secondary | ICD-10-CM

## 2012-04-30 DIAGNOSIS — K5900 Constipation, unspecified: Secondary | ICD-10-CM

## 2012-04-30 DIAGNOSIS — F341 Dysthymic disorder: Secondary | ICD-10-CM

## 2012-04-30 DIAGNOSIS — G9340 Encephalopathy, unspecified: Secondary | ICD-10-CM

## 2012-04-30 LAB — GLUCOSE, CAPILLARY
Glucose-Capillary: 187 mg/dL — ABNORMAL HIGH (ref 70–99)
Glucose-Capillary: 215 mg/dL — ABNORMAL HIGH (ref 70–99)
Glucose-Capillary: 188 mg/dL — ABNORMAL HIGH (ref 70–99)

## 2012-04-30 LAB — URINE CULTURE

## 2012-04-30 LAB — PHENYTOIN LEVEL, TOTAL: Phenytoin Lvl: <2.5 ug/mL — ABNORMAL LOW (ref 10.0–20.0)

## 2012-04-30 MED ORDER — FLUCONAZOLE 100 MG PO TABS
100.0000 mg | ORAL_TABLET | Freq: Every day | ORAL | Status: AC
  Administered 2012-05-01 – 2012-05-04 (×4): 100 mg
  Filled 2012-04-30 (×4): qty 1

## 2012-04-30 MED ORDER — FLUCONAZOLE 200 MG PO TABS
200.0000 mg | ORAL_TABLET | Freq: Once | ORAL | Status: AC
  Administered 2012-04-30: 200 mg
  Filled 2012-04-30: qty 1

## 2012-04-30 NOTE — Progress Notes (Addendum)
This shift pt had temp of 102.5 and diaphoretic. Hospitalist paged and Tylenol 650 mg given. Cold packs applied x4 to axillae and groin. Cold compress applied to forehead. Steady decrease to temperature. Will continue to monitor pt and intervene appropriately.

## 2012-04-30 NOTE — Progress Notes (Signed)
Utilization review completed.  

## 2012-04-30 NOTE — Progress Notes (Signed)
[facility] PROGRESS NOTE  [name] MVH:[id number] DOB: [birth year] DOA: 04/28/2012 PCP: No primary provider on file.  Assessment/Plan: 1. Acute Encephalopathy -Likely secondary to UTI, continue current abx pending culture - mentation Clinically improved. 2. Fever/UTI -Patient with fevers overnight, urine cultures pending blood cultures with no growth to date -Urinalysis with yeast, we'll add Diflucan( since she is stil febrile on current abx) -Continue current antibiotics -Repeat blood cultures  And follow 3.Recent HCAP -s/p full course of abx -CXR more c/w atx, will add incentive spirometry -continue current abx, follow and deescalate abx pending clinical course 4. Diabetes Mellitus-  -continue accucheck and  SSI coverage.  4. Sinus Tachycardia- Improved with Hydration. Monitor.  5. Parkinson's Disease- stable.  6. Hyponatremia- resolved with hydration 7. Dehydration- resolved 8.Seizure disorder -continue outpt meds  Code Status:full Family Communication: called husband at [phone], left message Disposition Plan: to home when stable   Consultants:  none  Procedures:  none  Antibiotics:  Cefepime and Vanc started on 3/22  HPI/Subjective: States feeling better this am, no further abdominal pain. Alert and appropriate. No seizures reported per nsg  Objective: Filed Vitals:   04/30/12 0208 04/30/12 0701 04/30/12 1053 04/30/12 1448  BP: 138/69 122/71 135/74 171/77  Pulse: 106 101 96 55  Temp: 98.8 F (37.1 C) 98.1 F (36.7 C) 98.7 F (37.1 C) 100.4 F (38 C)  TempSrc: Oral Oral Oral Oral  Resp: 18 20 18 18   Height:      Weight:  75.705 kg (166 lb 14.4 oz)    SpO2: 98% 95% 97% 99%    Intake/Output Summary (Last 24 hours) at 04/30/12 1500 Last data filed at 04/30/12 0709  Gross per 24 hour  Intake    240 ml  Output   2027 ml  Net  -1787 ml   Filed Weights   04/29/12 0237 04/29/12 0627 04/30/12 0701  Weight: 73.936 kg (163  lb) 73.619 kg (162 lb 4.8 oz) 75.705 kg (166 lb 14.4 oz)    Exam:   General:  alert and answers appropriately  Cardiovascular: RRR, nl S1S2  Respiratory: decreased BS at bases, no wheezes, no rhonchi  Abdomen: soft +BS, suprapubic tenderness, no rebound, PEG intact  Extremities: no cyanosis and no edema  Data Reviewed: Basic Metabolic Panel:  Recent Labs Lab 04/24/12 0500 04/25/12 0445 04/28/12 1918 04/29/12 0535  NA 139 137 131* 135  K 4.4 3.9 4.9 4.3  CL 103 101 93* 101  CO2 27 24 25 20   GLUCOSE 140* 132* 200* 274*  BUN 14 12 31* 27*  CREATININE 0.52 0.49* 0.92 0.75  CALCIUM 9.9 9.6 9.9 9.0   Liver Function Tests:  Recent Labs Lab 04/28/12 1918  AST 29  ALT 30  ALKPHOS 100  BILITOT 0.3  PROT 8.2  ALBUMIN 3.1*   No results found for this basename: LIPASE, AMYLASE,  in the last 168 hours No results found for this basename: AMMONIA,  in the last 168 hours CBC:  Recent Labs Lab 04/24/12 0500 04/25/12 0445 04/28/12 1918 04/29/12 0535  WBC 7.5 8.0 13.0* 7.4  NEUTROABS  --   --  9.7*  --   HGB 11.0* 11.3* 12.7 9.7*  HCT 35.5* 36.1 39.9 29.3*  MCV 92.2 92.3 90.3 89.3  PLT 322 288 325 226   Cardiac Enzymes: No results found for this basename: CKTOTAL, CKMB, CKMBINDEX, TROPONINI,  in the last 168 hours BNP (last 3 results) No results found for this basename: PROBNP,  in the last  8760 hours CBG:  Recent Labs Lab 04/29/12 2048 04/30/12 0026 04/30/12 0347 04/30/12 0806 04/30/12 1148  GLUCAP 228* 236* 187* 215* 188*    Recent Results (from the past 240 hour(s))  CULTURE, BLOOD (ROUTINE X 2)     Status: None   Collection Time    04/22/12  1:24 PM      Result Value Range Status   Specimen Description BLOOD RIGHT ARM   Final   Special Requests BOTTLES DRAWN AEROBIC AND ANAEROBIC 8 CC EA   Final   Culture  Setup Time 04/22/2012 20:12   Final   Culture NO GROWTH 5 DAYS   Final   Report Status 04/28/2012 FINAL   Final  CULTURE, BLOOD (ROUTINE X  2)     Status: None   Collection Time    04/22/12  1:34 PM      Result Value Range Status   Specimen Description BLOOD RIGHT ARM   Final   Special Requests BOTTLES DRAWN AEROBIC AND ANAEROBIC 5 CC EA   Final   Culture  Setup Time 04/22/2012 20:11   Final   Culture NO GROWTH 5 DAYS   Final   Report Status 04/28/2012 FINAL   Final  URINE CULTURE     Status: None   Collection Time    04/22/12  2:02 PM      Result Value Range Status   Specimen Description URINE, CLEAN CATCH   Final   Special Requests NONE   Final   Culture  Setup Time 04/22/2012 20:30   Final   Colony Count 50,000 COLONIES/ML   Final   Culture YEAST   Final   Report Status 04/24/2012 FINAL   Final  MRSA PCR SCREENING     Status: None   Collection Time    04/22/12  4:46 PM      Result Value Range Status   MRSA by PCR NEGATIVE  NEGATIVE Final   Comment:            The GeneXpert MRSA Assay (FDA     approved for NASAL specimens     only), is one component of a     comprehensive MRSA colonization     surveillance program. It is not     intended to diagnose MRSA     infection nor to guide or     monitor treatment for     MRSA infections.  URINE CULTURE     Status: None   Collection Time    04/28/12  7:00 PM      Result Value Range Status   Specimen Description URINE, CATHETERIZED   Final   Special Requests NONE   Final   Culture  Setup Time 04/29/2012 02:32   Final   Colony Count 20,OOO COLONIES/ML   Final   Culture YEAST   Final   Report Status 04/30/2012 FINAL   Final  CULTURE, BLOOD (ROUTINE X 2)     Status: None   Collection Time    04/28/12 11:20 PM      Result Value Range Status   Specimen Description BLOOD LEFT ARM   Final   Special Requests BOTTLES DRAWN AEROBIC ONLY    Final   Culture  Setup Time 04/29/2012 14:54   Final   Culture     Final   Value:        BLOOD CULTURE RECEIVED NO GROWTH TO DATE CULTURE WILL BE HELD FOR 5 DAYS BEFORE ISSUING A FINAL NEGATIVE REPORT  Report Status PENDING    Incomplete  CULTURE, BLOOD (ROUTINE X 2)     Status: None   Collection Time    04/28/12 11:20 PM      Result Value Range Status   Specimen Description BLOOD LEFT ARM   Final   Special Requests BOTTLES DRAWN AEROBIC AND ANAEROBIC    Final   Culture  Setup Time 04/29/2012 14:54   Final   Culture     Final   Value:        BLOOD CULTURE RECEIVED NO GROWTH TO DATE CULTURE WILL BE HELD FOR 5 DAYS BEFORE ISSUING A FINAL NEGATIVE REPORT   Report Status PENDING   Incomplete     Studies: Dg Chest Port 1 View  04/28/2012  *RADIOLOGY REPORT*  Clinical Data: Short of breath  PORTABLE CHEST - 1 VIEW  Comparison: 04/22/2012  Findings: Progression of bibasilar airspace disease which is most consistent with atelectasis.  There is decreased lung volume. Negative for heart failure or edema.  Small left effusion.  IMPRESSION: Increased bibasilar airspace disease, most consistent with atelectasis.  Small left effusion has developed.   Original Report Authenticated By: Janeece Riggers, M.D.     Scheduled Meds: . antiseptic oral rinse  15 mL Mouth Rinse q12n4p  . ceFEPime (MAXIPIME) IV  1 g Intravenous Q8H  . enoxaparin (LOVENOX) injection  40 mg Subcutaneous QHS  . famotidine  20 mg Per Tube BID  . feeding supplement (GLUCERNA 1.2 CAL)  237 mL Per Tube 5 X Daily  . fenofibrate  160 mg Oral Daily  . [START ON 05/01/2012] fluconazole  100 mg Oral Daily  . fluconazole  200 mg Per Tube Once  . hydrocortisone cream  1 application Topical TID  . insulin aspart  0-9 Units Subcutaneous Q4H  . insulin detemir  30 Units Subcutaneous BID  . levETIRAcetam  1,000 mg Per Tube BID  . multivitamin with minerals  1 tablet Per Tube Daily  . nystatin   Topical TID  . PARoxetine  40 mg Per Tube q morning - 10a  . phenytoin  300 mg Oral Daily  . vancomycin  750 mg Intravenous BID   Continuous Infusions: . sodium chloride 75 mL/hr at 04/30/12 1408    Principal Problem:   Acute encephalopathy Active Problems:    Fever   Diabetes mellitus   Sinus tachycardia   Parkinson's disease   Hyponatremia    Time spent:>61mins    Kela Millin  [facility] Pager [phone] If 7PM-7AM, please contact night-coverage at [email or web address], password [facility] 04/30/2012, 3:00 PM  LOS: 2 days

## 2012-04-30 NOTE — Progress Notes (Signed)
OT Cancellation Note  Patient Details Name: [name] MRN: [id number] DOB: 09-18-1940   Cancelled Treatment:    Reason Eval/Treat Not Completed: Other (comment) (Pt with no OT or PT needs at this time) Talked with pt's spouse and he reports pt was total care for all aspects of ADLS, feeding, and transfers.  No further OT or PT needs.  MCGUIRE,JAMES OTR/L Pager number [phone] 04/30/2012, 11:26 AM  Rebeca Alert, MPT Pager: [phone] 04/30/12 11:35 am

## 2012-05-01 DIAGNOSIS — E860 Dehydration: Secondary | ICD-10-CM

## 2012-05-01 DIAGNOSIS — I498 Other specified cardiac arrhythmias: Secondary | ICD-10-CM

## 2012-05-01 DIAGNOSIS — G20 Parkinson's disease: Secondary | ICD-10-CM

## 2012-05-01 DIAGNOSIS — E119 Type 2 diabetes mellitus without complications: Secondary | ICD-10-CM

## 2012-05-01 LAB — GLUCOSE, CAPILLARY
Glucose-Capillary: 244 mg/dL — ABNORMAL HIGH (ref 70–99)
Glucose-Capillary: 256 mg/dL — ABNORMAL HIGH (ref 70–99)
Glucose-Capillary: 262 mg/dL — ABNORMAL HIGH (ref 70–99)

## 2012-05-01 LAB — CBC
MCH: 28.7 pg (ref 26.0–34.0)
MCHC: 32.8 g/dL (ref 30.0–36.0)
MCV: 87.3 fL (ref 78.0–100.0)
Platelets: 189 10*3/uL (ref 150–400)
RDW: 14.7 % (ref 11.5–15.5)

## 2012-05-01 MED ORDER — VANCOMYCIN HCL 500 MG IV SOLR
500.0000 mg | Freq: Two times a day (BID) | INTRAVENOUS | Status: DC
  Administered 2012-05-01 – 2012-05-02 (×2): 500 mg via INTRAVENOUS
  Filled 2012-05-01 (×3): qty 500

## 2012-05-01 MED ORDER — GLUCERNA 1.2 CAL PO LIQD
237.0000 mL | Freq: Every day | ORAL | Status: DC
  Administered 2012-05-01 – 2012-05-04 (×15): 237 mL
  Filled 2012-05-01 (×17): qty 1000

## 2012-05-01 MED ORDER — LABETALOL HCL 5 MG/ML IV SOLN
5.0000 mg | INTRAVENOUS | Status: DC | PRN
  Administered 2012-05-01 – 2012-05-02 (×6): 5 mg via INTRAVENOUS
  Filled 2012-05-01 (×2): qty 4

## 2012-05-01 NOTE — Progress Notes (Signed)
[facility] PROGRESS NOTE  SHANIA BJELLAND NWG:[id number] DOB: 28-Mar-1940 DOA: 04/28/2012 PCP: No primary provider on file.  Assessment/Plan:  1. Acute Encephalopathy -Likely secondary to UTI, continue current abx pending culture - mentation Clinically improved. Patient is responding to questions appropriately.   2. Fever/UTI -Fevers resolving.  No fever within the last 24 hours. -Urinalysis with yeast, Fevers improved after diflucan administration reportedly -Continue current antibiotics -Repeated blood cultures negative  3.Recent HCAP -s/p full course of abx -CXR more c/w atx, will add incentive spirometry -continue current abx, follow and deescalate abx pending clinical course  4. Diabetes Mellitus-  -continue accucheck and  SSI coverage.   5. Sinus Tachycardia-  -Improving with Hydration.   6. Parkinson's Disease - stable.   7. Hyponatremia -Resolved with hydration  8. Dehydration - resolved  9.Seizure disorder  -continue outpt meds  Code Status:full Family Communication: called husband at [phone] and gave update.  Disposition Plan: Patient has had many bounce backs.  At this juncture we will need to consider proper placement for patient.  Consultants:  none  Procedures:  none  Antibiotics:  Cefepime and Vanc started on 3/22  On Diflucan  HPI/Subjective: States feeling better this today. No new complaints.  Objective: Filed Vitals:   05/01/12 0841 05/01/12 1029 05/01/12 1554 05/01/12 1805  BP: 151/73 165/79 171/76 172/77  Pulse: 103 103 103 105  Temp: 98.6 F (37 C) 98.3 F (36.8 C) 98.8 F (37.1 C) 99 F (37.2 C)  TempSrc: Oral Oral Oral Oral  Resp:  18 18 18   Height:      Weight:      SpO2: 98% 99% 98% 100%    Intake/Output Summary (Last 24 hours) at 05/01/12 1851 Last data filed at 05/01/12 1618  Gross per 24 hour  Intake    240 ml  Output   2900 ml  Net  -2660 ml   Filed Weights   04/29/12 0627 04/30/12 0701  05/01/12 0219  Weight: 73.619 kg (162 lb 4.8 oz) 75.705 kg (166 lb 14.4 oz) 75.751 kg (167 lb)    Exam:   General:  alert and awake  Cardiovascular: RRR, nl S1S2  Respiratory: decreased BS at bases, no wheezes, no rhonchi  Abdomen: soft +BS, suprapubic tenderness, no rebound, PEG intact  Extremities: no cyanosis and no edema  Data Reviewed: Basic Metabolic Panel:  Recent Labs Lab 04/25/12 0445 04/28/12 1918 04/29/12 0535  NA 137 131* 135  K 3.9 4.9 4.3  CL 101 93* 101  CO2 24 25 20   GLUCOSE 132* 200* 274*  BUN 12 31* 27*  CREATININE 0.49* 0.92 0.75  CALCIUM 9.6 9.9 9.0   Liver Function Tests:  Recent Labs Lab 04/28/12 1918  AST 29  ALT 30  ALKPHOS 100  BILITOT 0.3  PROT 8.2  ALBUMIN 3.1*   No results found for this basename: LIPASE, AMYLASE,  in the last 168 hours No results found for this basename: AMMONIA,  in the last 168 hours CBC:  Recent Labs Lab 04/25/12 0445 04/28/12 1918 04/29/12 0535 05/01/12 0545  WBC 8.0 13.0* 7.4 6.3  NEUTROABS  --  9.7*  --   --   HGB 11.3* 12.7 9.7* 8.6*  HCT 36.1 39.9 29.3* 26.2*  MCV 92.3 90.3 89.3 87.3  PLT 288 325 226 189   Cardiac Enzymes: No results found for this basename: CKTOTAL, CKMB, CKMBINDEX, TROPONINI,  in the last 168 hours BNP (last 3 results) No results found for this basename: PROBNP,  in  the last 8760 hours CBG:  Recent Labs Lab 05/01/12 0014 05/01/12 0356 05/01/12 0738 05/01/12 1114 05/01/12 1552  GLUCAP 244* 211* 256* 193* 300*    Recent Results (from the past 240 hour(s))  CULTURE, BLOOD (ROUTINE X 2)     Status: None   Collection Time    04/22/12  1:24 PM      Result Value Range Status   Specimen Description BLOOD RIGHT ARM   Final   Special Requests BOTTLES DRAWN AEROBIC AND ANAEROBIC 8 CC EA   Final   Culture  Setup Time 04/22/2012 20:12   Final   Culture NO GROWTH 5 DAYS   Final   Report Status 04/28/2012 FINAL   Final  CULTURE, BLOOD (ROUTINE X 2)     Status: None    Collection Time    04/22/12  1:34 PM      Result Value Range Status   Specimen Description BLOOD RIGHT ARM   Final   Special Requests BOTTLES DRAWN AEROBIC AND ANAEROBIC 5 CC EA   Final   Culture  Setup Time 04/22/2012 20:11   Final   Culture NO GROWTH 5 DAYS   Final   Report Status 04/28/2012 FINAL   Final  URINE CULTURE     Status: None   Collection Time    04/22/12  2:02 PM      Result Value Range Status   Specimen Description URINE, CLEAN CATCH   Final   Special Requests NONE   Final   Culture  Setup Time 04/22/2012 20:30   Final   Colony Count 50,000 COLONIES/ML   Final   Culture YEAST   Final   Report Status 04/24/2012 FINAL   Final  MRSA PCR SCREENING     Status: None   Collection Time    04/22/12  4:46 PM      Result Value Range Status   MRSA by PCR NEGATIVE  NEGATIVE Final   Comment:            The GeneXpert MRSA Assay (FDA     approved for NASAL specimens     only), is one component of a     comprehensive MRSA colonization     surveillance program. It is not     intended to diagnose MRSA     infection nor to guide or     monitor treatment for     MRSA infections.  URINE CULTURE     Status: None   Collection Time    04/28/12  7:00 PM      Result Value Range Status   Specimen Description URINE, CATHETERIZED   Final   Special Requests NONE   Final   Culture  Setup Time 04/29/2012 02:32   Final   Colony Count 20,OOO COLONIES/ML   Final   Culture YEAST   Final   Report Status 04/30/2012 FINAL   Final  CULTURE, BLOOD (ROUTINE X 2)     Status: None   Collection Time    04/28/12 11:20 PM      Result Value Range Status   Specimen Description BLOOD LEFT ARM   Final   Special Requests BOTTLES DRAWN AEROBIC ONLY    Final   Culture  Setup Time 04/29/2012 14:54   Final   Culture     Final   Value:        BLOOD CULTURE RECEIVED NO GROWTH TO DATE CULTURE WILL BE HELD FOR 5 DAYS BEFORE ISSUING A FINAL NEGATIVE  REPORT   Report Status PENDING   Incomplete  CULTURE,  BLOOD (ROUTINE X 2)     Status: None   Collection Time    04/28/12 11:20 PM      Result Value Range Status   Specimen Description BLOOD LEFT ARM   Final   Special Requests BOTTLES DRAWN AEROBIC AND ANAEROBIC    Final   Culture  Setup Time 04/29/2012 14:54   Final   Culture     Final   Value:        BLOOD CULTURE RECEIVED NO GROWTH TO DATE CULTURE WILL BE HELD FOR 5 DAYS BEFORE ISSUING A FINAL NEGATIVE REPORT   Report Status PENDING   Incomplete  CULTURE, BLOOD (ROUTINE X 2)     Status: None   Collection Time    04/30/12  3:20 PM      Result Value Range Status   Specimen Description BLOOD RIGHT ARM   Final   Special Requests BOTTLES DRAWN AEROBIC AND ANAEROBIC 5CC   Final   Culture  Setup Time 04/30/2012 21:56   Final   Culture     Final   Value:        BLOOD CULTURE RECEIVED NO GROWTH TO DATE CULTURE WILL BE HELD FOR 5 DAYS BEFORE ISSUING A FINAL NEGATIVE REPORT   Report Status PENDING   Incomplete  CULTURE, BLOOD (ROUTINE X 2)     Status: None   Collection Time    04/30/12  3:27 PM      Result Value Range Status   Specimen Description BLOOD LEFT ARM   Final   Special Requests BOTTLES DRAWN AEROBIC AND ANAEROBIC 5CC   Final   Culture  Setup Time 04/30/2012 21:58   Final   Culture     Final   Value:        BLOOD CULTURE RECEIVED NO GROWTH TO DATE CULTURE WILL BE HELD FOR 5 DAYS BEFORE ISSUING A FINAL NEGATIVE REPORT   Report Status PENDING   Incomplete  CLOSTRIDIUM DIFFICILE BY PCR     Status: None   Collection Time    05/01/12 12:33 PM      Result Value Range Status   C difficile by pcr NEGATIVE  NEGATIVE Final     Studies: No results found.  Scheduled Meds: . antiseptic oral rinse  15 mL Mouth Rinse q12n4p  . ceFEPime (MAXIPIME) IV  1 g Intravenous Q8H  . enoxaparin (LOVENOX) injection  40 mg Subcutaneous QHS  . famotidine  20 mg Per Tube BID  . feeding supplement (GLUCERNA 1.2 CAL)  237 mL Per Tube 5 X Daily  . fenofibrate  160 mg Oral Daily  . fluconazole  100 mg  Per Tube Daily  . hydrocortisone cream  1 application Topical TID  . insulin aspart  0-9 Units Subcutaneous Q4H  . insulin detemir  30 Units Subcutaneous BID  . levETIRAcetam  1,000 mg Per Tube BID  . multivitamin with minerals  1 tablet Per Tube Daily  . nystatin   Topical TID  . PARoxetine  40 mg Per Tube q morning - 10a  . phenytoin  300 mg Oral Daily  . vancomycin  500 mg Intravenous Q12H   Continuous Infusions: . sodium chloride 75 mL/hr at 05/01/12 1759    Principal Problem:   Acute encephalopathy Active Problems:   Fever   Diabetes mellitus   Sinus tachycardia   Parkinson's disease   Hyponatremia    Time spent:>  Penny Pia  [facility] Pager [phone] If 7PM-7AM, please contact night-coverage at [email or web address], password [facility] 05/01/2012, 6:51 PM  LOS: 3 days

## 2012-05-01 NOTE — Progress Notes (Signed)
ANTIBIOTIC CONSULT NOTE - FOLLOW UP  Pharmacy Consult for Vancomycin and Cefepime Indication: PNA, UTI  Allergies  Allergen Reactions  . Codeine Nausea And Vomiting  . Morphine And Related Nausea And Vomiting    Patient Measurements: Height: 5\' 3"  (160 cm) Weight: 167 lb (75.751 kg) IBW/kg (Calculated) : 52.4  Vital Signs: Temp: 98.6 F (37 C) (03/25 0841) Temp src: Oral (03/25 0841) BP: 151/73 mmHg (03/25 0841) Pulse Rate: 103 (03/25 0841) Intake/Output from previous day: 03/24 0701 - 03/25 0700 In: 240  Out: 3951 [Urine:3950; Stool:1] Intake/Output from this shift:    Labs:  Recent Labs  04/28/12 1918 04/29/12 0535 05/01/12 0545  WBC 13.0* 7.4 6.3  HGB 12.7 9.7* 8.6*  PLT 325 226 189  CREATININE 0.92 0.75  --    Estimated Creatinine Clearance: 64.8 ml/min (by C-G formula based on Cr of 0.75).  80 ml/min/1.56m2 (normalized)  Recent Labs  05/01/12 0835  VANCOTROUGH 23.9*     Microbiology: Recent Results (from the past 720 hour(s))  CULTURE, BLOOD (ROUTINE X 2)     Status: None   Collection Time    04/08/12  8:30 AM      Result Value Range Status   Specimen Description BLOOD RIGHT ANTECUBITAL   Final   Special Requests BOTTLES DRAWN AEROBIC AND ANAEROBIC 4 CC EA   Final   Culture  Setup Time 04/08/2012 15:22   Final   Culture NO GROWTH 5 DAYS   Final   Report Status 04/14/2012 FINAL   Final  URINE CULTURE     Status: None   Collection Time    04/08/12  8:52 AM      Result Value Range Status   Specimen Description URINE, CATHETERIZED   Final   Special Requests NONE   Final   Culture  Setup Time 04/08/2012 16:32   Final   Colony Count NO GROWTH   Final   Culture NO GROWTH   Final   Report Status 04/09/2012 FINAL   Final  CULTURE, BLOOD (ROUTINE X 2)     Status: None   Collection Time    04/08/12  9:11 AM      Result Value Range Status   Specimen Description BLOOD RIGHT HAND  1 ML IN [facility] BOTTLE   Final   Special Requests NONE   Final   Culture   Setup Time 04/08/2012 15:22   Final   Culture     Final   Value: STAPHYLOCOCCUS SPECIES (COAGULASE NEGATIVE)     Note: THE SIGNIFICANCE OF ISOLATING THIS ORGANISM FROM A SINGLE SET OF BLOOD CULTURES WHEN MULTIPLE SETS ARE DRAWN IS UNCERTAIN. PLEASE NOTIFY THE MICROBIOLOGY DEPARTMENT WITHIN ONE WEEK IF SPECIATION AND SENSITIVITIES ARE REQUIRED.     Note: Gram Stain Report Called to,Read Back By and Verified With: KRISTY SYKES ON 04/09/2012 AT 9:33P BY WILEJ   Report Status 04/12/2012 FINAL   Final  MRSA PCR SCREENING     Status: None   Collection Time    04/08/12 11:43 AM      Result Value Range Status   MRSA by PCR NEGATIVE  NEGATIVE Final   Comment:            The GeneXpert MRSA Assay (FDA     approved for NASAL specimens     only), is one component of a     comprehensive MRSA colonization     surveillance program. It is not     intended to diagnose MRSA  infection nor to guide or     monitor treatment for     MRSA infections.  CULTURE, BLOOD (ROUTINE X 2)     Status: None   Collection Time    04/10/12  8:10 AM      Result Value Range Status   Specimen Description BLOOD LEFT ARM   Final   Special Requests BOTTLES DRAWN AEROBIC AND ANAEROBIC 5CC   Final   Culture  Setup Time 04/10/2012 13:46   Final   Culture NO GROWTH 5 DAYS   Final   Report Status 04/16/2012 FINAL   Final  CULTURE, BLOOD (ROUTINE X 2)     Status: None   Collection Time    04/10/12  8:20 AM      Result Value Range Status   Specimen Description BLOOD LEFT ARM   Final   Special Requests BOTTLES DRAWN AEROBIC ONLY 1CC   Final   Culture  Setup Time 04/10/2012 13:46   Final   Culture NO GROWTH 5 DAYS   Final   Report Status 04/16/2012 FINAL   Final  URINE CULTURE     Status: None   Collection Time    04/16/12  7:21 PM      Result Value Range Status   Specimen Description URINE, CATHETERIZED   Final   Special Requests levaquin (d/c'ed) Normal   Final   Culture  Setup Time 04/17/2012 03:02   Final   Colony  Count NO GROWTH   Final   Culture NO GROWTH   Final   Report Status 04/17/2012 FINAL   Final  CULTURE, BLOOD (ROUTINE X 2)     Status: None   Collection Time    04/16/12 10:00 PM      Result Value Range Status   Specimen Description BLOOD LEFT ARM   Final   Special Requests BOTTLES DRAWN AEROBIC AND ANAEROBIC   Final   Culture  Setup Time 04/17/2012 02:54   Final   Culture NO GROWTH 5 DAYS   Final   Report Status 04/23/2012 FINAL   Final  CULTURE, BLOOD (ROUTINE X 2)     Status: None   Collection Time    04/16/12 10:19 PM      Result Value Range Status   Specimen Description BLOOD LEFT   Final   Special Requests BOTTLES DRAWN AEROBIC AND ANAEROBIC   Final   Culture  Setup Time 04/17/2012 02:54   Final   Culture NO GROWTH 5 DAYS   Final   Report Status 04/23/2012 FINAL   Final  CULTURE, BLOOD (ROUTINE X 2)     Status: None   Collection Time    04/22/12  1:24 PM      Result Value Range Status   Specimen Description BLOOD RIGHT ARM   Final   Special Requests BOTTLES DRAWN AEROBIC AND ANAEROBIC 8 CC EA   Final   Culture  Setup Time 04/22/2012 20:12   Final   Culture NO GROWTH 5 DAYS   Final   Report Status 04/28/2012 FINAL   Final  CULTURE, BLOOD (ROUTINE X 2)     Status: None   Collection Time    04/22/12  1:34 PM      Result Value Range Status   Specimen Description BLOOD RIGHT ARM   Final   Special Requests BOTTLES DRAWN AEROBIC AND ANAEROBIC 5 CC EA   Final   Culture  Setup Time 04/22/2012 20:11   Final   Culture NO GROWTH  5 DAYS   Final   Report Status 04/28/2012 FINAL   Final  URINE CULTURE     Status: None   Collection Time    04/22/12  2:02 PM      Result Value Range Status   Specimen Description URINE, CLEAN CATCH   Final   Special Requests NONE   Final   Culture  Setup Time 04/22/2012 20:30   Final   Colony Count 50,000 COLONIES/ML   Final   Culture YEAST   Final   Report Status 04/24/2012 FINAL   Final  MRSA PCR SCREENING     Status: None   Collection  Time    04/22/12  4:46 PM      Result Value Range Status   MRSA by PCR NEGATIVE  NEGATIVE Final   Comment:            The GeneXpert MRSA Assay (FDA     approved for NASAL specimens     only), is one component of a     comprehensive MRSA colonization     surveillance program. It is not     intended to diagnose MRSA     infection nor to guide or     monitor treatment for     MRSA infections.  URINE CULTURE     Status: None   Collection Time    04/28/12  7:00 PM      Result Value Range Status   Specimen Description URINE, CATHETERIZED   Final   Special Requests NONE   Final   Culture  Setup Time 04/29/2012 02:32   Final   Colony Count 20,OOO COLONIES/ML   Final   Culture YEAST   Final   Report Status 04/30/2012 FINAL   Final  CULTURE, BLOOD (ROUTINE X 2)     Status: None   Collection Time    04/28/12 11:20 PM      Result Value Range Status   Specimen Description BLOOD LEFT ARM   Final   Special Requests BOTTLES DRAWN AEROBIC ONLY    Final   Culture  Setup Time 04/29/2012 14:54   Final   Culture     Final   Value:        BLOOD CULTURE RECEIVED NO GROWTH TO DATE CULTURE WILL BE HELD FOR 5 DAYS BEFORE ISSUING A FINAL NEGATIVE REPORT   Report Status PENDING   Incomplete  CULTURE, BLOOD (ROUTINE X 2)     Status: None   Collection Time    04/28/12 11:20 PM      Result Value Range Status   Specimen Description BLOOD LEFT ARM   Final   Special Requests BOTTLES DRAWN AEROBIC AND ANAEROBIC    Final   Culture  Setup Time 04/29/2012 14:54   Final   Culture     Final   Value:        BLOOD CULTURE RECEIVED NO GROWTH TO DATE CULTURE WILL BE HELD FOR 5 DAYS BEFORE ISSUING A FINAL NEGATIVE REPORT   Report Status PENDING   Incomplete  CULTURE, BLOOD (ROUTINE X 2)     Status: None   Collection Time    04/30/12  3:20 PM      Result Value Range Status   Specimen Description BLOOD RIGHT ARM   Final   Special Requests BOTTLES DRAWN AEROBIC AND ANAEROBIC 5CC   Final   Culture  Setup  Time 04/30/2012 21:56   Final   Culture     Final  Value:        BLOOD CULTURE RECEIVED NO GROWTH TO DATE CULTURE WILL BE HELD FOR 5 DAYS BEFORE ISSUING A FINAL NEGATIVE REPORT   Report Status PENDING   Incomplete  CULTURE, BLOOD (ROUTINE X 2)     Status: None   Collection Time    04/30/12  3:27 PM      Result Value Range Status   Specimen Description BLOOD LEFT ARM   Final   Special Requests BOTTLES DRAWN AEROBIC AND ANAEROBIC 5CC   Final   Culture  Setup Time 04/30/2012 21:58   Final   Culture     Final   Value:        BLOOD CULTURE RECEIVED NO GROWTH TO DATE CULTURE WILL BE HELD FOR 5 DAYS BEFORE ISSUING A FINAL NEGATIVE REPORT   Report Status PENDING   Incomplete    Anti-infectives   Start     Dose/Rate Route Frequency Ordered Stop   05/01/12 1000  fluconazole (DIFLUCAN) tablet 100 mg     100 mg Per Tube Daily 04/30/12 1500 05/05/12 0959   04/30/12 1600  fluconazole (DIFLUCAN) tablet 200 mg     200 mg Per Tube  Once 04/30/12 1500 04/30/12 1808   04/29/12 2000  cefTRIAXone (ROCEPHIN) 1 g in dextrose 5 % 50 mL IVPB  Status:  Discontinued     1 g 100 mL/hr over 30 Minutes Intravenous Every 24 hours 04/28/12 2147 04/28/12 2157   04/29/12 1000  vancomycin (VANCOCIN) 750 mg in sodium chloride 0.9 % 150 mL IVPB     750 mg 150 mL/hr over 60 Minutes Intravenous 2 times daily 04/29/12 0239     04/29/12 0600  ceFEPIme (MAXIPIME) 1 g in dextrose 5 % 50 mL IVPB     1 g 100 mL/hr over 30 Minutes Intravenous 3 times per day 04/29/12 0239     04/28/12 2215  vancomycin (VANCOCIN) IVPB 1000 mg/200 mL premix     1,000 mg 200 mL/hr over 60 Minutes Intravenous  Once 04/28/12 2203 04/29/12 0000   04/28/12 2215  ceFEPIme (MAXIPIME) 2 g in dextrose 5 % 50 mL IVPB     2 g 100 mL/hr over 30 Minutes Intravenous  Once 04/28/12 2203 04/28/12 2330   04/28/12 2015  cefTRIAXone (ROCEPHIN) 1 g in dextrose 5 % 50 mL IVPB     1 g 120 mL/hr over 30 Minutes Intravenous  Once 04/28/12 1955 04/28/12 2106       Assessment:  Day #3 vancomycin and cefepime for possible PNA, UTI, fevers  Tmax 102.5 yesterday, afebrile this am. WBC improved to normal, SCr improved 3/23  All cultures negative to date, except small amount of yeast in urine on 3/22 - MD started fluconazole 3/24-3/28.  Vanc trough slightly elevated, but drawn ~1hr early, will proceed with dose decrease given age, potential for accumulation.  Goal of Therapy:  Vancomycin trough level 15-20 mcg/ml  Plan:   Decrease Vancomycin to 500mg  IV q12h  Recheck trough at steady state if continues  Continue Cefepime 1gm IV q8h Follow up renal function & cultures Follow up duration of therapy - are antibiotics still necessary?  Loralee Pacas, PharmD, BCPS Pager: [phone] 05/01/2012,9:39 AM

## 2012-05-01 NOTE — Progress Notes (Signed)
Inpatient Diabetes Program Recommendations  AACE/ADA: New Consensus Statement on Inpatient Glycemic Control (2013)  Target Ranges:  Prepandial:   less than 140 mg/dL      Peak postprandial:   less than 180 mg/dL (1-2 hours)      Critically ill patients:  140 - 180 mg/dL   Reason for Assessment:  Hyperglycemia  Results for [name], [name] (MRN [id number]) as of 05/01/2012 10:27  Ref. Range 04/30/2012 00:26 04/30/2012 03:47 04/30/2012 08:06 04/30/2012 11:48 04/30/2012 17:30 04/30/2012 20:18 05/01/2012 00:14 05/01/2012 03:56 05/01/2012 07:38  Glucose-Capillary Latest Range: 70-99 mg/dL 782 (H) 956 (H) 213 (H) 188 (H) 285 (H) 242 (H) 244 (H) 211 (H) 256 (H)   Needs meal coverage insulin for TFs.   Recommendations:  Novolog 4 units prior to giving Glucerna (5 times/day) for meal coverage insulin.  Thank you. Ailene Ards, RD, LDN, CDE Inpatient Diabetes Coordinator [phone]

## 2012-05-02 LAB — GLUCOSE, CAPILLARY
Glucose-Capillary: 257 mg/dL — ABNORMAL HIGH (ref 70–99)
Glucose-Capillary: 198 mg/dL — ABNORMAL HIGH (ref 70–99)

## 2012-05-02 MED ORDER — METOPROLOL TARTRATE 100 MG PO TABS
100.0000 mg | ORAL_TABLET | Freq: Two times a day (BID) | ORAL | Status: DC
  Administered 2012-05-02 – 2012-05-04 (×5): 100 mg
  Filled 2012-05-02 (×6): qty 1

## 2012-05-02 MED ORDER — HYDRALAZINE HCL 25 MG PO TABS
25.0000 mg | ORAL_TABLET | Freq: Three times a day (TID) | ORAL | Status: DC
  Administered 2012-05-02 – 2012-05-04 (×8): 25 mg
  Filled 2012-05-02 (×10): qty 1

## 2012-05-02 MED ORDER — PHENYTOIN 125 MG/5ML PO SUSP
200.0000 mg | Freq: Two times a day (BID) | ORAL | Status: DC
  Administered 2012-05-02 – 2012-05-04 (×5): 200 mg
  Filled 2012-05-02 (×8): qty 8

## 2012-05-02 MED ORDER — LEVOFLOXACIN 500 MG PO TABS
500.0000 mg | ORAL_TABLET | Freq: Every day | ORAL | Status: DC
  Administered 2012-05-02 – 2012-05-04 (×3): 500 mg
  Filled 2012-05-02 (×3): qty 1

## 2012-05-02 MED ORDER — LABETALOL HCL 5 MG/ML IV SOLN
5.0000 mg | INTRAVENOUS | Status: AC
  Administered 2012-05-02: 5 mg via INTRAVENOUS
  Filled 2012-05-02: qty 4

## 2012-05-02 MED ORDER — INSULIN DETEMIR 100 UNIT/ML ~~LOC~~ SOLN
40.0000 [IU] | Freq: Two times a day (BID) | SUBCUTANEOUS | Status: DC
  Administered 2012-05-02 – 2012-05-03 (×2): 40 [IU] via SUBCUTANEOUS
  Filled 2012-05-02 (×3): qty 0.4

## 2012-05-02 MED ORDER — LABETALOL HCL 5 MG/ML IV SOLN: 5.0000 mg | INTRAVENOUS | Status: AC

## 2012-05-02 NOTE — Progress Notes (Signed)
[facility] PROGRESS NOTE  TAMIYA COLELLO QIO:[id number] DOB: 03-Sep-1940 DOA: 04/28/2012 PCP: No primary provider on file.  Assessment/Plan:  1. Metabolic Encephalopathy - Likely secondary to UTI, underlying parkinson's disease - mentation Clinically improved, per husband mentation is at baseline  2. Possible  UTI -Urinalysis with yeast, Fevers improved after diflucan administration reportedly -de-escalate antibiotics, DC cefepime and Vanc, change to Po levaquin for 3 more days -Repeated blood cultures negative  3.Recent HCAP -s/p full course of abx -CXR more c/w atx, will add incentive spirometry  4. Diabetes Mellitus- - CBGs up, increase levemir, SSI  5. Sinus Tachycardia-  -resume home toprol  6. Parkinson's Disease - stable.   7. Hyponatremia -Resolved with hydration  8. Dehydration - resolved  9.Seizure disorder  -continue keppra and dilantin change to suspension per pharmacist discussion  Code Status:full Family Communication: called husband at [phone]  Disposition Plan: d/w husband regarding SNF, CSW consult.  Consultants:  none  Procedures:  none  Antibiotics:  Cefepime and Vanc started on 3/22-3/25  On Diflucan levaquin 3/26 HPI/Subjective: Feels weak, no new complaints  Objective: Filed Vitals:   05/02/12 0604 05/02/12 0949 05/02/12 1200 05/02/12 1257  BP: 194/70 180/78 155/77 173/77  Pulse: 109   85  Temp: 98 F (36.7 C)     TempSrc: Oral     Resp: 16     Height:      Weight: 71.4 kg (157 lb 6.5 oz)     SpO2: 97%       Intake/Output Summary (Last 24 hours) at 05/02/12 1303 Last data filed at 05/02/12 0853  Gross per 24 hour  Intake   6596 ml  Output   2950 ml  Net   3646 ml   Filed Weights   04/30/12 0701 05/01/12 0219 05/02/12 0604  Weight: 75.705 kg (166 lb 14.4 oz) 75.751 kg (167 lb) 71.4 kg (157 lb 6.5 oz)    Exam:   General:  alert and awake  Cardiovascular: RRR, nl S1S2  Respiratory: decreased BS at  bases, no wheezes, no rhonchi  Abdomen: soft +BS, suprapubic tenderness, no rebound, PEG intact  Extremities: no cyanosis and no edema  Data Reviewed: Basic Metabolic Panel:  Recent Labs Lab 04/28/12 1918 04/29/12 0535  NA 131* 135  K 4.9 4.3  CL 93* 101  CO2 25 20  GLUCOSE 200* 274*  BUN 31* 27*  CREATININE 0.92 0.75  CALCIUM 9.9 9.0   Liver Function Tests:  Recent Labs Lab 04/28/12 1918  AST 29  ALT 30  ALKPHOS 100  BILITOT 0.3  PROT 8.2  ALBUMIN 3.1*   No results found for this basename: LIPASE, AMYLASE,  in the last 168 hours No results found for this basename: AMMONIA,  in the last 168 hours CBC:  Recent Labs Lab 04/28/12 1918 04/29/12 0535 05/01/12 0545  WBC 13.0* 7.4 6.3  NEUTROABS 9.7*  --   --   HGB 12.7 9.7* 8.6*  HCT 39.9 29.3* 26.2*  MCV 90.3 89.3 87.3  PLT 325 226 189   Cardiac Enzymes: No results found for this basename: CKTOTAL, CKMB, CKMBINDEX, TROPONINI,  in the last 168 hours BNP (last 3 results) No results found for this basename: PROBNP,  in the last 8760 hours CBG:  Recent Labs Lab 05/01/12 2025 05/01/12 2356 05/02/12 0443 05/02/12 0735 05/02/12 1155  GLUCAP 262* 209* 207* 267* 257*    Recent Results (from the past 240 hour(s))  CULTURE, BLOOD (ROUTINE X 2)     Status:  None   Collection Time    04/22/12  1:24 PM      Result Value Range Status   Specimen Description BLOOD RIGHT ARM   Final   Special Requests BOTTLES DRAWN AEROBIC AND ANAEROBIC 8 CC EA   Final   Culture  Setup Time 04/22/2012 20:12   Final   Culture NO GROWTH 5 DAYS   Final   Report Status 04/28/2012 FINAL   Final  CULTURE, BLOOD (ROUTINE X 2)     Status: None   Collection Time    04/22/12  1:34 PM      Result Value Range Status   Specimen Description BLOOD RIGHT ARM   Final   Special Requests BOTTLES DRAWN AEROBIC AND ANAEROBIC 5 CC EA   Final   Culture  Setup Time 04/22/2012 20:11   Final   Culture NO GROWTH 5 DAYS   Final   Report Status  04/28/2012 FINAL   Final  URINE CULTURE     Status: None   Collection Time    04/22/12  2:02 PM      Result Value Range Status   Specimen Description URINE, CLEAN CATCH   Final   Special Requests NONE   Final   Culture  Setup Time 04/22/2012 20:30   Final   Colony Count 50,000 COLONIES/ML   Final   Culture YEAST   Final   Report Status 04/24/2012 FINAL   Final  MRSA PCR SCREENING     Status: None   Collection Time    04/22/12  4:46 PM      Result Value Range Status   MRSA by PCR NEGATIVE  NEGATIVE Final   Comment:            The GeneXpert MRSA Assay (FDA     approved for NASAL specimens     only), is one component of a     comprehensive MRSA colonization     surveillance program. It is not     intended to diagnose MRSA     infection nor to guide or     monitor treatment for     MRSA infections.  URINE CULTURE     Status: None   Collection Time    04/28/12  7:00 PM      Result Value Range Status   Specimen Description URINE, CATHETERIZED   Final   Special Requests NONE   Final   Culture  Setup Time 04/29/2012 02:32   Final   Colony Count 20,OOO COLONIES/ML   Final   Culture YEAST   Final   Report Status 04/30/2012 FINAL   Final  CULTURE, BLOOD (ROUTINE X 2)     Status: None   Collection Time    04/28/12 11:20 PM      Result Value Range Status   Specimen Description BLOOD LEFT ARM   Final   Special Requests BOTTLES DRAWN AEROBIC ONLY    Final   Culture  Setup Time 04/29/2012 14:54   Final   Culture     Final   Value:        BLOOD CULTURE RECEIVED NO GROWTH TO DATE CULTURE WILL BE HELD FOR 5 DAYS BEFORE ISSUING A FINAL NEGATIVE REPORT   Report Status PENDING   Incomplete  CULTURE, BLOOD (ROUTINE X 2)     Status: None   Collection Time    04/28/12 11:20 PM      Result Value Range Status   Specimen Description BLOOD LEFT ARM  Final   Special Requests BOTTLES DRAWN AEROBIC AND ANAEROBIC    Final   Culture  Setup Time 04/29/2012 14:54   Final   Culture     Final    Value:        BLOOD CULTURE RECEIVED NO GROWTH TO DATE CULTURE WILL BE HELD FOR 5 DAYS BEFORE ISSUING A FINAL NEGATIVE REPORT   Report Status PENDING   Incomplete  CULTURE, BLOOD (ROUTINE X 2)     Status: None   Collection Time    04/30/12  3:20 PM      Result Value Range Status   Specimen Description BLOOD RIGHT ARM   Final   Special Requests BOTTLES DRAWN AEROBIC AND ANAEROBIC 5CC   Final   Culture  Setup Time 04/30/2012 21:56   Final   Culture     Final   Value:        BLOOD CULTURE RECEIVED NO GROWTH TO DATE CULTURE WILL BE HELD FOR 5 DAYS BEFORE ISSUING A FINAL NEGATIVE REPORT   Report Status PENDING   Incomplete  CULTURE, BLOOD (ROUTINE X 2)     Status: None   Collection Time    04/30/12  3:27 PM      Result Value Range Status   Specimen Description BLOOD LEFT ARM   Final   Special Requests BOTTLES DRAWN AEROBIC AND ANAEROBIC 5CC   Final   Culture  Setup Time 04/30/2012 21:58   Final   Culture     Final   Value:        BLOOD CULTURE RECEIVED NO GROWTH TO DATE CULTURE WILL BE HELD FOR 5 DAYS BEFORE ISSUING A FINAL NEGATIVE REPORT   Report Status PENDING   Incomplete  CLOSTRIDIUM DIFFICILE BY PCR     Status: None   Collection Time    05/01/12 12:33 PM      Result Value Range Status   C difficile by pcr NEGATIVE  NEGATIVE Final     Studies: No results found.  Scheduled Meds: . antiseptic oral rinse  15 mL Mouth Rinse q12n4p  . enoxaparin (LOVENOX) injection  40 mg Subcutaneous QHS  . famotidine  20 mg Per Tube BID  . feeding supplement (GLUCERNA 1.2 CAL)  237 mL Per Tube 5 X Daily  . fenofibrate  160 mg Oral Daily  . fluconazole  100 mg Per Tube Daily  . hydrALAZINE  25 mg Per Tube Q8H  . hydrocortisone cream  1 application Topical TID  . insulin aspart  0-9 Units Subcutaneous Q4H  . insulin detemir  30 Units Subcutaneous BID  . labetalol  5 mg Intravenous NOW  . levETIRAcetam  1,000 mg Per Tube BID  . levofloxacin  500 mg Per Tube Daily  . metoprolol tartrate  100  mg Per Tube BID  . multivitamin with minerals  1 tablet Per Tube Daily  . nystatin   Topical TID  . PARoxetine  40 mg Per Tube q morning - 10a  . phenytoin  200 mg Per Tube BID   Continuous Infusions: . sodium chloride 20 mL/hr at 05/02/12 0935    Principal Problem:   Acute encephalopathy Active Problems:   Fever   Diabetes mellitus   Sinus tachycardia   Parkinson's disease   Hyponatremia    Time spent:>    Kollins Fenter  [facility] Pager [phone] If 7PM-7AM, please contact night-coverage at [email or web address], password [facility] 05/02/2012, 1:03 PM  LOS: 4 days

## 2012-05-03 DIAGNOSIS — I10 Essential (primary) hypertension: Secondary | ICD-10-CM

## 2012-05-03 DIAGNOSIS — J189 Pneumonia, unspecified organism: Secondary | ICD-10-CM

## 2012-05-03 LAB — BASIC METABOLIC PANEL
CO2: 23 mEq/L (ref 19–32)
Chloride: 102 mEq/L (ref 96–112)
Potassium: 4.2 mEq/L (ref 3.5–5.1)
Sodium: 137 mEq/L (ref 135–145)

## 2012-05-03 LAB — CBC
Platelets: 293 10*3/uL (ref 150–400)
RBC: 3.31 MIL/uL — ABNORMAL LOW (ref 3.87–5.11)
WBC: 5.9 10*3/uL (ref 4.0–10.5)

## 2012-05-03 LAB — GLUCOSE, CAPILLARY
Glucose-Capillary: 212 mg/dL — ABNORMAL HIGH (ref 70–99)
Glucose-Capillary: 250 mg/dL — ABNORMAL HIGH (ref 70–99)

## 2012-05-03 MED ORDER — INSULIN DETEMIR 100 UNIT/ML ~~LOC~~ SOLN
45.0000 [IU] | Freq: Two times a day (BID) | SUBCUTANEOUS | Status: DC
  Administered 2012-05-03 – 2012-05-04 (×2): 45 [IU] via SUBCUTANEOUS
  Filled 2012-05-03 (×3): qty 0.45

## 2012-05-03 NOTE — Progress Notes (Signed)
[facility] PROGRESS NOTE  [name] ZOX:[id number] DOB: 30-Dec-1940 DOA: 04/28/2012 PCP: No primary provider on file.  Assessment/Plan:  1. Metabolic Encephalopathy - Likely secondary to UTI, underlying parkinson's disease - mentation Clinically improved, per husband mentation is at baseline  2. Possible  UTI -Urinalysis with yeast, Fevers improved after diflucan administration reportedly -de-escalated antibiotics yesterday, s/p 4 days of cefepime and Vanc, change to Po levaquin 3/26 for 3 more days -Repeated blood cultures negative  3.Recent HCAP -s/p full course of abx -CXR more c/w atx, will add incentive spirometry  4. Diabetes Mellitus- - CBGs up, increase levemir to 45units BID, SSI  5. Sinus Tachycardia- improved -resumed home toprol  6. Parkinson's Disease - stable.   7. Hyponatremia -Resolved with hydration  8. Dehydration - resolved  9.Seizure disorder  -continue keppra and dilantin,  changed to suspension per pharmacist discussion  Code Status:full Family Communication: called husband at [phone]  Disposition Plan: d/w husband regarding SNF, CSW consult requested  Consultants:  none  Procedures:  none  Antibiotics:  Cefepime and Vanc started on 3/22-3/25  On Diflucan levaquin 3/26  HPI/Subjective: Feels weak, no new complaints  Objective: Filed Vitals:   05/03/12 0403 05/03/12 0607 05/03/12 1013 05/03/12 1337  BP:  167/69 132/72 135/73  Pulse:  101 96 79  Temp:  98 F (36.7 C) 99.1 F (37.3 C) 98.6 F (37 C)  TempSrc:  Oral Oral Oral  Resp:  18 16 18   Height:      Weight: 68.584 kg (151 lb 3.2 oz)     SpO2:  99% 99% 100%    Intake/Output Summary (Last 24 hours) at 05/03/12 1519 Last data filed at 05/03/12 1014  Gross per 24 hour  Intake    100 ml  Output   1800 ml  Net  -1700 ml   Filed Weights   05/01/12 0219 05/02/12 0604 05/03/12 0403  Weight: 75.751 kg (167 lb) 71.4 kg (157 lb 6.5 oz) 68.584 kg (151 lb  3.2 oz)    Exam:   General:  alert and awake, talkative  Cardiovascular: RRR, nl S1S2  Respiratory: decreased BS at bases, no wheezes, no rhonchi  Abdomen: soft +BS, NT, no rebound, PEG intact  Extremities: no cyanosis and no edema  Data Reviewed: Basic Metabolic Panel:  Recent Labs Lab 04/28/12 1918 04/29/12 0535 05/03/12 0450  NA 131* 135 137  K 4.9 4.3 4.2  CL 93* 101 102  CO2 25 20 23   GLUCOSE 200* 274* 186*  BUN 31* 27* 16  CREATININE 0.92 0.75 0.49*  CALCIUM 9.9 9.0 9.5   Liver Function Tests:  Recent Labs Lab 04/28/12 1918  AST 29  ALT 30  ALKPHOS 100  BILITOT 0.3  PROT 8.2  ALBUMIN 3.1*   No results found for this basename: LIPASE, AMYLASE,  in the last 168 hours No results found for this basename: AMMONIA,  in the last 168 hours CBC:  Recent Labs Lab 04/28/12 1918 04/29/12 0535 05/01/12 0545 05/03/12 0450  WBC 13.0* 7.4 6.3 5.9  NEUTROABS 9.7*  --   --   --   HGB 12.7 9.7* 8.6* 9.3*  HCT 39.9 29.3* 26.2* 29.4*  MCV 90.3 89.3 87.3 88.8  PLT 325 226 189 293   Cardiac Enzymes: No results found for this basename: CKTOTAL, CKMB, CKMBINDEX, TROPONINI,  in the last 168 hours BNP (last 3 results) No results found for this basename: PROBNP,  in the last 8760 hours CBG:  Recent Labs Lab 05/02/12  2017 05/03/12 0007 05/03/12 0354 05/03/12 0828 05/03/12 1129  GLUCAP 237* 212* 171* 235* 238*    Recent Results (from the past 240 hour(s))  URINE CULTURE     Status: None   Collection Time    04/28/12  7:00 PM      Result Value Range Status   Specimen Description URINE, CATHETERIZED   Final   Special Requests NONE   Final   Culture  Setup Time 04/29/2012 02:32   Final   Colony Count 20,OOO COLONIES/ML   Final   Culture YEAST   Final   Report Status 04/30/2012 FINAL   Final  CULTURE, BLOOD (ROUTINE X 2)     Status: None   Collection Time    04/28/12 11:20 PM      Result Value Range Status   Specimen Description BLOOD LEFT ARM   Final    Special Requests BOTTLES DRAWN AEROBIC ONLY    Final   Culture  Setup Time 04/29/2012 14:54   Final   Culture     Final   Value:        BLOOD CULTURE RECEIVED NO GROWTH TO DATE CULTURE WILL BE HELD FOR 5 DAYS BEFORE ISSUING A FINAL NEGATIVE REPORT   Report Status PENDING   Incomplete  CULTURE, BLOOD (ROUTINE X 2)     Status: None   Collection Time    04/28/12 11:20 PM      Result Value Range Status   Specimen Description BLOOD LEFT ARM   Final   Special Requests BOTTLES DRAWN AEROBIC AND ANAEROBIC    Final   Culture  Setup Time 04/29/2012 14:54   Final   Culture     Final   Value:        BLOOD CULTURE RECEIVED NO GROWTH TO DATE CULTURE WILL BE HELD FOR 5 DAYS BEFORE ISSUING A FINAL NEGATIVE REPORT   Report Status PENDING   Incomplete  CULTURE, BLOOD (ROUTINE X 2)     Status: None   Collection Time    04/30/12  3:20 PM      Result Value Range Status   Specimen Description BLOOD RIGHT ARM   Final   Special Requests BOTTLES DRAWN AEROBIC AND ANAEROBIC 5CC   Final   Culture  Setup Time 04/30/2012 21:56   Final   Culture     Final   Value:        BLOOD CULTURE RECEIVED NO GROWTH TO DATE CULTURE WILL BE HELD FOR 5 DAYS BEFORE ISSUING A FINAL NEGATIVE REPORT   Report Status PENDING   Incomplete  CULTURE, BLOOD (ROUTINE X 2)     Status: None   Collection Time    04/30/12  3:27 PM      Result Value Range Status   Specimen Description BLOOD LEFT ARM   Final   Special Requests BOTTLES DRAWN AEROBIC AND ANAEROBIC 5CC   Final   Culture  Setup Time 04/30/2012 21:58   Final   Culture     Final   Value:        BLOOD CULTURE RECEIVED NO GROWTH TO DATE CULTURE WILL BE HELD FOR 5 DAYS BEFORE ISSUING A FINAL NEGATIVE REPORT   Report Status PENDING   Incomplete  CLOSTRIDIUM DIFFICILE BY PCR     Status: None   Collection Time    05/01/12 12:33 PM      Result Value Range Status   C difficile by pcr NEGATIVE  NEGATIVE Final  Studies: No results found.  Scheduled Meds: . antiseptic  oral rinse  15 mL Mouth Rinse q12n4p  . enoxaparin (LOVENOX) injection  40 mg Subcutaneous QHS  . famotidine  20 mg Per Tube BID  . feeding supplement (GLUCERNA 1.2 CAL)  237 mL Per Tube 5 X Daily  . fenofibrate  160 mg Oral Daily  . fluconazole  100 mg Per Tube Daily  . hydrALAZINE  25 mg Per Tube Q8H  . hydrocortisone cream  1 application Topical TID  . insulin aspart  0-9 Units Subcutaneous Q4H  . insulin detemir  40 Units Subcutaneous BID  . levETIRAcetam  1,000 mg Per Tube BID  . levofloxacin  500 mg Per Tube Daily  . metoprolol tartrate  100 mg Per Tube BID  . multivitamin with minerals  1 tablet Per Tube Daily  . nystatin   Topical TID  . PARoxetine  40 mg Per Tube q morning - 10a  . phenytoin  200 mg Per Tube BID   Continuous Infusions: . sodium chloride Stopped (05/02/12 1400)    Principal Problem:   Acute encephalopathy Active Problems:   Fever   Diabetes mellitus   Sinus tachycardia   Parkinson's disease   Hyponatremia    Time spent:>    Saleem Coccia  [facility] Pager [phone] If 7PM-7AM, please contact night-coverage at [email or web address], password [facility] 05/03/2012, 3:19 PM  LOS: 5 days

## 2012-05-03 NOTE — Progress Notes (Signed)
Tele monitor rang out VT.  I was in the room with the pt at this time. Patients arm was noted to tremor at the time and bp was being taken.  Cental tele informed.

## 2012-05-04 LAB — GLUCOSE, CAPILLARY
Glucose-Capillary: 226 mg/dL — ABNORMAL HIGH (ref 70–99)
Glucose-Capillary: 227 mg/dL — ABNORMAL HIGH (ref 70–99)
Glucose-Capillary: 247 mg/dL — ABNORMAL HIGH (ref 70–99)

## 2012-05-04 MED ORDER — PHENYTOIN 125 MG/5ML PO SUSP: 200.0000 mg | Freq: Two times a day (BID) | ORAL | Status: DC

## 2012-05-04 MED ORDER — CLONAZEPAM 0.5 MG PO TABS: 0.5000 mg | ORAL_TABLET | Freq: Every evening | ORAL | Status: DC | PRN

## 2012-05-04 MED ORDER — GLUCERNA 1.2 CAL PO LIQD
237.0000 mL | Freq: Every day | ORAL | Status: DC
  Administered 2012-05-04 (×2): 237 mL
  Filled 2012-05-04 (×4): qty 237

## 2012-05-04 MED ORDER — INSULIN DETEMIR 100 UNIT/ML ~~LOC~~ SOLN: 40.0000 [IU] | Freq: Two times a day (BID) | SUBCUTANEOUS | Status: DC

## 2012-05-04 MED ORDER — HYDRALAZINE HCL 25 MG PO TABS: 25.0000 mg | ORAL_TABLET | Freq: Three times a day (TID) | ORAL | Status: DC

## 2012-05-04 NOTE — Progress Notes (Signed)
Pt husband states that we can send her home around 530 pm.  Ptar called and requested transport.

## 2012-05-04 NOTE — Progress Notes (Signed)
[facility] here to transport pt home.

## 2012-05-04 NOTE — Progress Notes (Signed)
Clinical Social Work Department BRIEF PSYCHOSOCIAL ASSESSMENT 05/04/2012  Patient:  [facility]     Account Number:  [id number]     Admit date:  04/28/2012  Clinical Social Worker:  Hattie Perch  Date/Time:  05/04/2012 12:00 M  Referred by:  Physician  Date Referred:  05/04/2012 Referred for  SNF Placement   Other Referral:   Interview type:  Family Other interview type:    PSYCHOSOCIAL DATA Living Status:  FAMILY Admitted from facility:   Level of care:   Primary support name:  Cristela Stalder Primary support relationship to patient:  SPOUSE Degree of support available:   poor    CURRENT CONCERNS Current Concerns  Post-Acute Placement   Other Concerns:    SOCIAL WORK ASSESSMENT / PLAN CSW spoke with patient's husband. CSW expressed concerns that patient has been admitted to the hospital 3 times in the past month and patient likely needs snf/ltc placement. Spouse continues to adamantly refuse snf/ltc placement. he states that he will take patient home. CSW discussed risks of this at length with spouse. he continues to refuse.   Assessment/plan status:   Other assessment/ plan:   Information/referral to community resources:    PATIENT'S/FAMILY'S RESPONSE TO PLAN OF CARE: Spouse refuses snf/ltc placement. no other CSW needs noted.

## 2012-05-04 NOTE — Progress Notes (Signed)
Pt to be d/c home discharge instructions reviewed with the pt husband over the phone, he voices understanding.

## 2012-05-04 NOTE — Progress Notes (Signed)
NUTRITION FOLLOW UP  Intervention:   - Will add additional can of Glucerna 1.2 via PEG as pt with unintended weight loss since admission. Glucerna 1.2 via PEG 6 cans/day will provide 1710 calories, 85g protein, free water meeting 114% estimated calorie needs, 113% estimated protein needs - Will continue to monitor weight, CBGs, and TF tolerance   Nutrition Dx:   Inadequate oral intake related to history of dysphagia and aspiration pneumonia as evidenced by pt with PEG using TF as main source of nutrition - ongoing   Goal:   TF tolerance and to meet >90% of estimated nutritional needs - met   Monitor:   Weights, labs, TF tolerance, CBGs  Assessment:   Pt resting. RN reports pt without TF residuals. Getting TF of Glucerna 1.2 via PEG to 5 cans/day which will provide a total of 1425 calories, 71g protein, water meeting 95% estimated calorie and protein needs. Pt's weight down 12 pounds since admission. CBGs remain elevated. Pt with +1 generalized edema  CBG (last 3)   Recent Labs  05/04/12 0417 05/04/12 0802 05/04/12 1122  GLUCAP 152* 247* 299*       Height: Ht Readings from Last 1 Encounters:  04/29/12 5\' 3"  (1.6 m)    Weight Status:   Wt Readings from Last 1 Encounters:  05/04/12 151 lb 9.6 oz (68.765 kg)    Re-estimated needs:  Kcal: 1500-1700  Protein: 75-90g  Fluid: 1.5-1.7L/day  Skin: Stage 2 coccyx pressure ulcer   Diet Order: NPO   Intake/Output Summary (Last 24 hours) at 05/04/12 1130 Last data filed at 05/04/12 0933  Gross per 24 hour  Intake      0 ml  Output      0 ml  Net      0 ml    Last BM: 3/25   Labs:   Recent Labs Lab 04/28/12 1918 04/29/12 0535 05/03/12 0450  NA 131* 135 137  K 4.9 4.3 4.2  CL 93* 101 102  CO2 25 20 23   BUN 31* 27* 16  CREATININE 0.92 0.75 0.49*  CALCIUM 9.9 9.0 9.5  GLUCOSE 200* 274* 186*    CBG (last 3)   Recent Labs  05/04/12 0009 05/04/12 0417 05/04/12 0802  GLUCAP 227* 152* 247*     Scheduled Meds: . antiseptic oral rinse  15 mL Mouth Rinse q12n4p  . enoxaparin (LOVENOX) injection  40 mg Subcutaneous QHS  . famotidine  20 mg Per Tube BID  . feeding supplement (GLUCERNA 1.2 CAL)  237 mL Per Tube 5 X Daily  . fenofibrate  160 mg Oral Daily  . hydrALAZINE  25 mg Per Tube Q8H  . hydrocortisone cream  1 application Topical TID  . insulin aspart  0-9 Units Subcutaneous Q4H  . insulin detemir  45 Units Subcutaneous BID  . levETIRAcetam  1,000 mg Per Tube BID  . levofloxacin  500 mg Per Tube Daily  . metoprolol tartrate  100 mg Per Tube BID  . multivitamin with minerals  1 tablet Per Tube Daily  . nystatin   Topical TID  . PARoxetine  40 mg Per Tube q morning - 10a  . phenytoin  200 mg Per Tube BID    Continuous Infusions: . sodium chloride Stopped (05/02/12 1400)     Levon Hedger MS, RD, LDN [phone] Pager [phone] After Hours Pager

## 2012-05-06 LAB — CULTURE, BLOOD (ROUTINE X 2): Culture: NO GROWTH

## 2012-05-07 LAB — CULTURE, BLOOD (ROUTINE X 2): Culture: NO GROWTH

## 2012-05-10 ENCOUNTER — Inpatient Hospital Stay (HOSPITAL_COMMUNITY): Payer: 59

## 2012-05-10 ENCOUNTER — Encounter (HOSPITAL_COMMUNITY): Payer: Self-pay | Admitting: Emergency Medicine

## 2012-05-10 ENCOUNTER — Emergency Department (HOSPITAL_COMMUNITY): Payer: 59

## 2012-05-10 ENCOUNTER — Inpatient Hospital Stay (HOSPITAL_COMMUNITY)
Admission: EM | Admit: 2012-05-10 | Discharge: 2012-05-16 | DRG: 177 | Disposition: A | Discharge status: Hospice | Payer: 59 | Attending: Internal Medicine | Admitting: Internal Medicine

## 2012-05-10 DIAGNOSIS — J90 Pleural effusion, not elsewhere classified: Secondary | ICD-10-CM | POA: Diagnosis present

## 2012-05-10 DIAGNOSIS — K259 Gastric ulcer, unspecified as acute or chronic, without hemorrhage or perforation: Secondary | ICD-10-CM | POA: Diagnosis present

## 2012-05-10 DIAGNOSIS — S68419A Complete traumatic amputation of unspecified hand at wrist level, initial encounter: Secondary | ICD-10-CM

## 2012-05-10 DIAGNOSIS — Z66 Do not resuscitate: Secondary | ICD-10-CM | POA: Diagnosis present

## 2012-05-10 DIAGNOSIS — C649 Malignant neoplasm of unspecified kidney, except renal pelvis: Secondary | ICD-10-CM | POA: Diagnosis present

## 2012-05-10 DIAGNOSIS — K922 Gastrointestinal hemorrhage, unspecified: Secondary | ICD-10-CM

## 2012-05-10 DIAGNOSIS — D72829 Elevated white blood cell count, unspecified: Secondary | ICD-10-CM

## 2012-05-10 DIAGNOSIS — I10 Essential (primary) hypertension: Secondary | ICD-10-CM

## 2012-05-10 DIAGNOSIS — R195 Other fecal abnormalities: Secondary | ICD-10-CM | POA: Diagnosis present

## 2012-05-10 DIAGNOSIS — IMO0001 Reserved for inherently not codable concepts without codable children: Secondary | ICD-10-CM | POA: Diagnosis present

## 2012-05-10 DIAGNOSIS — E871 Hypo-osmolality and hyponatremia: Secondary | ICD-10-CM

## 2012-05-10 DIAGNOSIS — Z7401 Bed confinement status: Secondary | ICD-10-CM

## 2012-05-10 DIAGNOSIS — Z515 Encounter for palliative care: Secondary | ICD-10-CM

## 2012-05-10 DIAGNOSIS — G9340 Encephalopathy, unspecified: Secondary | ICD-10-CM

## 2012-05-10 DIAGNOSIS — Z794 Long term (current) use of insulin: Secondary | ICD-10-CM

## 2012-05-10 DIAGNOSIS — D62 Acute posthemorrhagic anemia: Secondary | ICD-10-CM

## 2012-05-10 DIAGNOSIS — N3289 Other specified disorders of bladder: Secondary | ICD-10-CM | POA: Diagnosis present

## 2012-05-10 DIAGNOSIS — F329 Major depressive disorder, single episode, unspecified: Secondary | ICD-10-CM

## 2012-05-10 DIAGNOSIS — F32A Depression, unspecified: Secondary | ICD-10-CM

## 2012-05-10 DIAGNOSIS — Z6827 Body mass index (BMI) 27.0-27.9, adult: Secondary | ICD-10-CM

## 2012-05-10 DIAGNOSIS — E119 Type 2 diabetes mellitus without complications: Secondary | ICD-10-CM

## 2012-05-10 DIAGNOSIS — R569 Unspecified convulsions: Secondary | ICD-10-CM

## 2012-05-10 DIAGNOSIS — R509 Fever, unspecified: Secondary | ICD-10-CM

## 2012-05-10 DIAGNOSIS — K222 Esophageal obstruction: Secondary | ICD-10-CM | POA: Diagnosis present

## 2012-05-10 DIAGNOSIS — J690 Pneumonitis due to inhalation of food and vomit: Principal | ICD-10-CM

## 2012-05-10 DIAGNOSIS — Z79899 Other long term (current) drug therapy: Secondary | ICD-10-CM

## 2012-05-10 DIAGNOSIS — Z931 Gastrostomy status: Secondary | ICD-10-CM

## 2012-05-10 DIAGNOSIS — E43 Unspecified severe protein-calorie malnutrition: Secondary | ICD-10-CM | POA: Diagnosis present

## 2012-05-10 DIAGNOSIS — N179 Acute kidney failure, unspecified: Secondary | ICD-10-CM | POA: Diagnosis present

## 2012-05-10 DIAGNOSIS — D649 Anemia, unspecified: Secondary | ICD-10-CM

## 2012-05-10 DIAGNOSIS — G20 Parkinson's disease: Secondary | ICD-10-CM | POA: Diagnosis present

## 2012-05-10 DIAGNOSIS — G20A1 Parkinson's disease without dyskinesia, without mention of fluctuations: Secondary | ICD-10-CM | POA: Diagnosis present

## 2012-05-10 DIAGNOSIS — G809 Cerebral palsy, unspecified: Secondary | ICD-10-CM | POA: Diagnosis present

## 2012-05-10 DIAGNOSIS — K449 Diaphragmatic hernia without obstruction or gangrene: Secondary | ICD-10-CM | POA: Diagnosis present

## 2012-05-10 LAB — COMPREHENSIVE METABOLIC PANEL
ALT: 16 U/L (ref 0–35)
AST: 23 U/L (ref 0–37)
Albumin: 2.6 g/dL — ABNORMAL LOW (ref 3.5–5.2)
Alkaline Phosphatase: 70 U/L (ref 39–117)
BUN: 34 mg/dL — ABNORMAL HIGH (ref 6–23)
Chloride: 92 mEq/L — ABNORMAL LOW (ref 96–112)
Potassium: 4.7 mEq/L (ref 3.5–5.1)
Total Bilirubin: 0.2 mg/dL — ABNORMAL LOW (ref 0.3–1.2)

## 2012-05-10 LAB — CBC WITH DIFFERENTIAL/PLATELET
Basophils Absolute: 0 10*3/uL (ref 0.0–0.1)
Basophils Relative: 0 % (ref 0–1)
Eosinophils Absolute: 0 10*3/uL (ref 0.0–0.7)
Hemoglobin: 5.8 g/dL — CL (ref 12.0–15.0)
Lymphocytes Relative: 15 % (ref 12–46)
MCHC: 32 g/dL (ref 30.0–36.0)
Neutrophils Relative %: 77 % (ref 43–77)

## 2012-05-10 LAB — PREPARE RBC (CROSSMATCH)

## 2012-05-10 LAB — ABO/RH: ABO/RH(D): O POS

## 2012-05-10 LAB — POCT I-STAT TROPONIN I

## 2012-05-10 MED ORDER — SODIUM CHLORIDE 0.9 % IV SOLN
1000.0000 mL | Freq: Once | INTRAVENOUS | Status: AC
  Administered 2012-05-10: 1000 mL via INTRAVENOUS

## 2012-05-10 MED ORDER — PAROXETINE HCL 20 MG PO TABS
40.0000 mg | ORAL_TABLET | Freq: Every morning | ORAL | Status: DC
  Administered 2012-05-11 – 2012-05-16 (×6): 40 mg
  Filled 2012-05-10 (×6): qty 2

## 2012-05-10 MED ORDER — SODIUM CHLORIDE 0.9 % IV SOLN: INTRAVENOUS | Status: DC

## 2012-05-10 MED ORDER — PREGABALIN 75 MG PO CAPS
150.0000 mg | ORAL_CAPSULE | Freq: Two times a day (BID) | ORAL | Status: DC
  Administered 2012-05-10 – 2012-05-16 (×12): 150 mg via ORAL
  Filled 2012-05-10 (×5): qty 2
  Filled 2012-05-10 (×2): qty 1
  Filled 2012-05-10: qty 2
  Filled 2012-05-10: qty 1
  Filled 2012-05-10 (×4): qty 2

## 2012-05-10 MED ORDER — PANTOPRAZOLE SODIUM 40 MG IV SOLR: 40.0000 mg | Freq: Once | INTRAVENOUS | Status: DC

## 2012-05-10 MED ORDER — ONDANSETRON HCL 4 MG/2ML IJ SOLN: 4.0000 mg | Freq: Four times a day (QID) | INTRAMUSCULAR | Status: DC | PRN

## 2012-05-10 MED ORDER — PHENYTOIN 125 MG/5ML PO SUSP
200.0000 mg | Freq: Two times a day (BID) | ORAL | Status: DC
  Administered 2012-05-10 – 2012-05-16 (×12): 200 mg
  Filled 2012-05-10 (×15): qty 8

## 2012-05-10 MED ORDER — VANCOMYCIN HCL IN DEXTROSE 1-5 GM/200ML-% IV SOLN
1000.0000 mg | Freq: Once | INTRAVENOUS | Status: AC
  Administered 2012-05-10: 1000 mg via INTRAVENOUS
  Filled 2012-05-10: qty 200

## 2012-05-10 MED ORDER — PIPERACILLIN-TAZOBACTAM 3.375 G IVPB
3.3750 g | Freq: Three times a day (TID) | INTRAVENOUS | Status: DC
  Administered 2012-05-10 – 2012-05-14 (×12): 3.375 g via INTRAVENOUS
  Filled 2012-05-10 (×13): qty 50

## 2012-05-10 MED ORDER — ACETAMINOPHEN 325 MG PO TABS
650.0000 mg | ORAL_TABLET | Freq: Four times a day (QID) | ORAL | Status: DC | PRN
  Administered 2012-05-10: 650 mg via ORAL
  Filled 2012-05-10: qty 2

## 2012-05-10 MED ORDER — INSULIN ASPART 100 UNIT/ML ~~LOC~~ SOLN
0.0000 [IU] | Freq: Three times a day (TID) | SUBCUTANEOUS | Status: DC
  Administered 2012-05-11 (×3): 5 [IU] via SUBCUTANEOUS
  Administered 2012-05-12 (×2): 3 [IU] via SUBCUTANEOUS
  Administered 2012-05-12: 8 [IU] via SUBCUTANEOUS
  Administered 2012-05-13 (×2): 3 [IU] via SUBCUTANEOUS
  Administered 2012-05-14: 5 [IU] via SUBCUTANEOUS
  Administered 2012-05-14 (×2): 3 [IU] via SUBCUTANEOUS
  Administered 2012-05-15: 2 [IU] via SUBCUTANEOUS
  Administered 2012-05-16: 3 [IU] via SUBCUTANEOUS

## 2012-05-10 MED ORDER — GLUCERNA SHAKE PO LIQD
237.0000 mL | Freq: Every day | ORAL | Status: DC
  Administered 2012-05-10 – 2012-05-11 (×5): 237 mL via ORAL
  Filled 2012-05-10 (×7): qty 237

## 2012-05-10 MED ORDER — GLUCERNA 1.2 CAL PO LIQD
237.0000 mL | Freq: Every day | ORAL | Status: DC
  Filled 2012-05-10 (×2): qty 237

## 2012-05-10 MED ORDER — VANCOMYCIN HCL IN DEXTROSE 750-5 MG/150ML-% IV SOLN
750.0000 mg | Freq: Two times a day (BID) | INTRAVENOUS | Status: DC
  Administered 2012-05-11: 750 mg via INTRAVENOUS
  Filled 2012-05-10 (×2): qty 150

## 2012-05-10 MED ORDER — BIOTENE DRY MOUTH MT LIQD
15.0000 mL | Freq: Two times a day (BID) | OROMUCOSAL | Status: DC
  Administered 2012-05-10 – 2012-05-16 (×11): 15 mL via OROMUCOSAL

## 2012-05-10 MED ORDER — SODIUM CHLORIDE 0.9 % IV SOLN
INTRAVENOUS | Status: DC
  Administered 2012-05-10 – 2012-05-11 (×2): via INTRAVENOUS

## 2012-05-10 MED ORDER — DEXTROSE 5 % IV SOLN
1.0000 g | Freq: Two times a day (BID) | INTRAVENOUS | Status: DC
  Administered 2012-05-10: 1 g via INTRAVENOUS
  Filled 2012-05-10: qty 1

## 2012-05-10 MED ORDER — SODIUM CHLORIDE 0.9 % IV SOLN: 1000.0000 mL | INTRAVENOUS | Status: DC

## 2012-05-10 MED ORDER — ACETAMINOPHEN 650 MG RE SUPP
RECTAL | Status: AC
  Administered 2012-05-10: 650 mg via RECTAL
  Filled 2012-05-10: qty 1

## 2012-05-10 MED ORDER — ONDANSETRON HCL 4 MG PO TABS: 4.0000 mg | ORAL_TABLET | Freq: Four times a day (QID) | ORAL | Status: DC | PRN

## 2012-05-10 MED ORDER — SODIUM CHLORIDE 0.9 % IJ SOLN: 10.0000 mL | INTRAMUSCULAR | Status: DC | PRN

## 2012-05-10 MED ORDER — HYDROCODONE-ACETAMINOPHEN 5-325 MG PO TABS
1.0000 | ORAL_TABLET | Freq: Four times a day (QID) | ORAL | Status: DC | PRN
  Administered 2012-05-10 – 2012-05-16 (×5): 1
  Filled 2012-05-10 (×5): qty 1

## 2012-05-10 MED ORDER — FREE WATER
200.0000 mL | Freq: Four times a day (QID) | Status: DC
  Administered 2012-05-10 – 2012-05-16 (×20): 200 mL

## 2012-05-10 MED ORDER — NYSTATIN 100000 UNIT/GM EX POWD
1.0000 g | Freq: Three times a day (TID) | CUTANEOUS | Status: DC | PRN
  Filled 2012-05-10: qty 15

## 2012-05-10 MED ORDER — ACETAMINOPHEN 650 MG RE SUPP: 650.0000 mg | Freq: Four times a day (QID) | RECTAL | Status: DC | PRN

## 2012-05-10 MED ORDER — LEVETIRACETAM 100 MG/ML PO SOLN
1000.0000 mg | Freq: Two times a day (BID) | ORAL | Status: DC
  Administered 2012-05-10 – 2012-05-16 (×12): 1000 mg
  Filled 2012-05-10 (×14): qty 10

## 2012-05-10 MED ORDER — LEVETIRACETAM 500 MG PO TABS: 1000.0000 mg | ORAL_TABLET | Freq: Two times a day (BID) | ORAL | Status: DC

## 2012-05-10 MED ORDER — INSULIN ASPART 100 UNIT/ML ~~LOC~~ SOLN
0.0000 [IU] | Freq: Every day | SUBCUTANEOUS | Status: DC
  Administered 2012-05-11: 2 [IU] via SUBCUTANEOUS
  Administered 2012-05-12: 0 [IU] via SUBCUTANEOUS

## 2012-05-10 MED ORDER — SODIUM CHLORIDE 0.9 % IJ SOLN
3.0000 mL | Freq: Two times a day (BID) | INTRAMUSCULAR | Status: DC
  Administered 2012-05-12 – 2012-05-16 (×4): 3 mL via INTRAVENOUS

## 2012-05-10 MED ORDER — ACETAMINOPHEN 650 MG RE SUPP: 650.0000 mg | Freq: Once | RECTAL | Status: AC

## 2012-05-10 MED ORDER — PIPERACILLIN-TAZOBACTAM 3.375 G IVPB 30 MIN: 3.3750 g | Freq: Three times a day (TID) | INTRAVENOUS | Status: DC

## 2012-05-10 MED ORDER — METOPROLOL TARTRATE 12.5 MG HALF TABLET
12.5000 mg | ORAL_TABLET | Freq: Two times a day (BID) | ORAL | Status: DC
  Administered 2012-05-10 – 2012-05-16 (×12): 12.5 mg via ORAL
  Filled 2012-05-10 (×13): qty 1

## 2012-05-10 NOTE — ED Notes (Signed)
Patient transported to X-ray 

## 2012-05-10 NOTE — ED Notes (Signed)
PER EMS- pt picked up from home with c/o fever.  Home health reports 101.0.  Pt had pneumonia 3 weeks ago, completed antibiotics, but pt condition hasn't improved. Pt is alert and oriented. Pt is bed ridden due to MS.  Denies pain.

## 2012-05-10 NOTE — Progress Notes (Signed)
Peripherally Inserted Central Catheter/Midline Placement  The IV Nurse has discussed with the patient and/or persons authorized to consent for the patient, the purpose of this procedure and the potential benefits and risks involved with this procedure.  The benefits include less needle sticks, lab draws from the catheter and patient may be discharged home with the catheter.  Risks include, but not limited to, infection, bleeding, blood clot (thrombus formation), and puncture of an artery; nerve damage and irregular heat beat.  Alternatives to this procedure were also discussed.  PICC/Midline Placement Documentation  PICC / Midline Single Lumen 05/10/12 PICC Right Basilic (Active)       Christeen Douglas 05/10/2012, 10:20 PM

## 2012-05-10 NOTE — Progress Notes (Signed)
[facility] ED CM noted CM consult for home health CM spoke with Baxter Hire of [facility] to inform her of pt admission.  Baxter Hire confirms pt was seen on 05/10/12 by the [facility] RN prior to pt being sent to ED

## 2012-05-10 NOTE — Progress Notes (Signed)
Pt states she was seeing Dr Leonides Sake and had an appointment to be seen on May 11 2012 but she was called by him to state he was "going out of business" Pt gave Cm permission to assist with finding a list of [employer] care and/or medicare providers near her home

## 2012-05-10 NOTE — ED Notes (Signed)
Called to give report, receiving nurse unavailable.  Will call back in .

## 2012-05-10 NOTE — H&P (Signed)
[facility] History and Physical  [name] ZOX:[id number] DOB: 1940-03-25 DOA: 05/10/2012   PCP: Johny Blamer, MD   Chief Complaint: mental status change  HPI:  72 y.o. year-old female with history of CP, possible MR, Parkinsonism, T2DM, renal cancer, dysphagia with recurrent aspiration pneumonia, bladder spasms who presents with altered mental status and fever.  Today's admission represents the patient's fourth admission in the past 6 weeks. The patient was most recently discharged from the hospital after an admission on March 23-March 27 for UTI and healthcare associated pneumonia. The patient was discharged with 3 days of Levaquin. At baseline, the patient is bedbound and requires full assist for all her activities of daily living from her husband.  Today, the patient was visited by her home health nurse who noted a fever of 101.68F. As a result the patient was sent to the hospital for further evaluation. The patient is a poor historian, and part of this history is obtained from speaking with the patient's husband. The patient's husband states that the patient has been having some chest discomfort without shortness of breath. There has not been any vomiting, diarrhea, or complaint of any other pain. There's been no shortness of breath or coughing or hemoptysis. He has not noted any hematochezia or melena. There has been no changes in the patient's medication since discharge from the hospital.  In the emergency department, the patient was found to have a temperature 101.55F. The patient was started on vancomycin, cefepime. In addition, the patient was noted to have a hemoglobin of 5.8. The patient was transfused 2 units PRBCs. The patient remained hemodynamically stable. In addition, serum sodium was 130, BUN 34, creatinine 0.3. Assessment/Plan: Acute encephalopathy -Multifactorial including GI bleed/anemia, medications including Klonopin, acute kidney injury/dehydration and  infectious etiology -At the time of my evaluation, the patient is able to speak in complete sentences although she is pleasantly confused. -Minimize opioids and hypnotics -Check Dilantin level -Patient has had 2 separate EEGs in the past month negative for epileptiform activity -04/09/12 TSH 4.867 -Check free T4 -Urine drug screen Anemia of acute blood loss -Patient was noted to have positive Hemoccult in the emergency department -Check INR/PTT -[facility] consulted already -Patient remains hemodynamically stable without any active source of bleeding at this time -No history of NSAIDs -Elevated BUN suggests possible upper GI etiology although the patient is clinically dehydrated. Fever/leukocytosis -Blood cultures have been obtained -Obtain UA and urine culture -Start the patient on empiric vanco and zosyn -Patient has history of aspiration pneumonitis and currently fed by gastrostomy tube--has been denies putting anything in the patient's mouth -Chest x-ray without any acute abnormality, but appears to have continued right lower lobe opacity from previous HCAP--> obtain CT chest -Procalcitonin 3.96 Hypertension -Blood pressure is soft and emergency department -Hold hydralazine -Restart metoprolol tartrate and significant lower dose 12.5 mg twice a day Diabetes mellitus type 2 -Hemoglobin A1c 8.3 on 04/29/2012 -Continue NovoLog sliding scale -Hold home dose of Levemir at this time as the patient's glucose in the emergency department 165 -Restart Glucerna -Free water flushes 200cc q 6 hrs Chest pain -cycle troponins Hyponatremia -likely volume depletion -baseline creatinine 0.4-0.5 CODE STATUS -Patient is full code, verified with the patient's husband        Past Medical History  Diagnosis Date  . Diabetes mellitus   . Renal disorder   . CP (cerebral palsy)   . Parkinsonism   . Dysphagia   . Recurrent aspiration pneumonia   . Bladder spasms   .  Amputation of arm   .  Recurrent UTI   . Renal cancer     s/p nephrectomy   Past Surgical History  Procedure Laterality Date  . 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 Peg tube placement    . Knee surgery    . Nephrectomy     Social History:  reports that she has never smoked. She has never used smokeless tobacco. She reports that she does not drink alcohol or use illicit drugs.   Family History  Problem Relation Age of Onset  . CAD Mother   . CAD Mother      Allergies  Allergen Reactions  . Codeine Nausea And Vomiting  . Morphine And Related Nausea And Vomiting      Prior to Admission medications   Medication Sig Start Date End Date Taking? Authorizing Provider  antiseptic oral rinse (BIOTENE) LIQD 15 mLs by Mouth Rinse route 2 (two) times daily.    Historical Provider, MD  clonazePAM (KLONOPIN) 0.5 MG tablet Place 1 tablet (0.5 mg total) into feeding tube at bedtime as needed for anxiety. 05/04/12   Zannie Cove, MD  diphenoxylate-atropine (LOMOTIL) 2.5-0.025 MG per tablet Place 1 tablet into feeding tube 3 (three) times daily as needed. For diarrhea    Historical Provider, MD  fenofibrate (TRICOR) 145 MG tablet Place 145 mg into feeding tube at bedtime.     Historical Provider, MD  hydrALAZINE (APRESOLINE) 25 MG tablet Place 1 tablet (25 mg total) into feeding tube 3 (three) times daily. 05/04/12   Zannie Cove, MD  HYDROcodone-acetaminophen (NORCO/VICODIN) 5-325 MG per tablet Place 1 tablet into feeding tube 2 (two) times daily as needed for pain. 04/25/12   Penny Pia, MD  hydrocortisone cream 1 % Apply 1 application topically 3 (three) times daily as needed (for rash on neck/back.).    Historical Provider, MD  insulin aspart (NOVOLOG) 100 UNIT/ML injection Inject 0-15 Units into the skin every 4 (four) hours. Before each meal 3 times a day, 140-199 - 2 units, 200-250 - 4 units, 251-299 - 6 units,  300-349 - 8 units,  350 or above 10 units. 04/21/12   Leroy Sea, MD   insulin detemir (LEVEMIR) 100 UNIT/ML injection Inject 0.4 mLs (40 Units total) into the skin 2 (two) times daily. 05/04/12   Zannie Cove, MD  levETIRAcetam (KEPPRA) 1000 MG tablet Place 1,000 mg into feeding tube 2 (two) times daily.    Historical Provider, MD  metFORMIN (GLUCOPHAGE) 1000 MG tablet Place 1,000 mg into feeding tube 2 (two) times daily with a meal.    Historical Provider, MD  metoprolol tartrate (LOPRESSOR) 100 MG tablet Place 1 tablet (100 mg total) into feeding tube 2 (two) times daily. 04/21/12   Leroy Sea, MD  Multiple Vitamin (MULTIVITAMIN WITH MINERALS) TABS Place 1 tablet into feeding tube daily. 04/21/12   Leroy Sea, MD  Nutritional Supplements (FEEDING SUPPLEMENT, GLUCERNA 1.2 CAL,) LIQD Place 237 mLs into feeding tube 5 (five) times daily. 04/25/12   Penny Pia, MD  nystatin (MYCOSTATIN/NYSTOP) 100000 UNIT/GM POWD Apply 1 g topically 3 (three) times daily as needed (for groin rash.).    Historical Provider, MD  PARoxetine (PAXIL) 40 MG tablet Place 40 mg into feeding tube every morning.     Historical Provider, MD  phenytoin (DILANTIN) 125 MG/5ML suspension Place 8 mLs (200 mg total) into feeding tube 2 (two) times daily. 05/04/12   Zannie Cove, MD  pregabalin (LYRICA) 150 MG capsule Place 150 mg into feeding tube 2 (two) times daily.     Historical Provider, MD  ranitidine (ZANTAC) 300 MG tablet Place 300 mg into feeding tube at bedtime.     Historical Provider, MD    Review of Systems:  Very limited due to the patient's acute encephalopathy and history of chronic encephalopathy. He says that discussed above, review of systems is negative for 10 point review of system  Physical Exam: Filed Vitals:   05/10/12 1259 05/10/12 1316 05/10/12 1515 05/10/12 1546  BP:   111/49 114/51  Pulse:   86 87  Temp:   99 F (37.2 C) 97.7 F (36.5 C)  TempSrc:   Oral Oral  Resp:   20 15  Height:  5' 2.99" (1.6 m)    Weight:  68.8 kg (151 lb 10.8 oz)    SpO2: 94%       General:  A&O x 2, NAD, nontoxic, pleasant/cooperative Head/Eye: No conjunctival hemorrhage, no icterus, [place]/AT, No nystagmus ENT:  No icterus,  No thrush, good dentition, no pharyngeal exudate; dry mucus membranes Neck:  No masses, no lymphadenpathy, no bruits CV:  RRR, no rub, no gallop, no S3 Lung:  Clear to auscultation left lung, diminished breath sounds right base. No wheezes or rhonchi. Her movement.  Abdomen: soft/NT, +BS, nondistended, no peritoneal signs; gastrotomy tube site without erythema or drainage.  Ext: No cyanosis, No rashes, No petechiae, No lymphangitis, trace edema; examination of back and sacral area without any open wounds Neuro: CNII-XII intact, strength 0/5 in bilateral upper and lower extremities, protopathic sensation intact in all 4 extremities  Labs on Admission:  Basic Metabolic Panel:  Recent Labs Lab 05/10/12 1330  NA 130*  K 4.7  CL 92*  CO2 26  GLUCOSE 165*  BUN 34*  CREATININE 0.83  CALCIUM 10.1   Liver Function Tests:  Recent Labs Lab 05/10/12 1330  AST 23  ALT 16  ALKPHOS 70  BILITOT 0.2*  PROT 8.1  ALBUMIN 2.6*   No results found for this basename: LIPASE, AMYLASE,  in the last 168 hours No results found for this basename: AMMONIA,  in the last 168 hours CBC:  Recent Labs Lab 05/10/12 1330  WBC 18.6*  NEUTROABS 14.3*  HGB 5.8*  HCT 18.1*  MCV 89.6  PLT 565*   Cardiac Enzymes: No results found for this basename: CKTOTAL, CKMB, CKMBINDEX, TROPONINI,  in the last 168 hours BNP: No components found with this basename: POCBNP,  CBG:  Recent Labs Lab 05/04/12 0009 05/04/12 0417 05/04/12 0802 05/04/12 1122 05/04/12 1607  GLUCAP 227* 152* 247* 299* 226*    Radiological Exams on Admission: Dg Chest 2 View  05/10/2012  *RADIOLOGY REPORT*  Clinical Data: Fever.  Renal cancer  CHEST - 2 VIEW  Comparison: 04/28/2012  Findings: Improvement in bibasilar airspace disease compared with the prior study.  There remains  elevation of the right hemidiaphragm, and mild atelectasis in the lung bases.  Small left effusion is improved.  There is no heart failure or edema.  IMPRESSION: Improvement in bibasilar airspace disease and small left effusion. Negative for heart failure.   Original Report Authenticated By: Janeece Riggers, M.D.     EKG: Independently reviewed. Sinus rhythm, no ST-T wave changes, LAFB    Time spent:70 minutes Code Status:   FULL Family Communication:   Family at bedside   Ohn Bostic, DO  [facility] Pager [phone]  If 7PM-7AM, please contact night-coverage [email or web address] Password  [email or web address] 05/10/2012, 4:45 PM

## 2012-05-10 NOTE — Progress Notes (Signed)
ANTIBIOTIC CONSULT NOTE - INITIAL  Pharmacy Consult for Vancomycin Indication: suspected pneumonia  Allergies  Allergen Reactions  . Codeine Nausea And Vomiting  . Morphine And Related Nausea And Vomiting    Patient Measurements: Height: 5' 2.99" (160 cm) (as documented 04/29/12) Weight: 151 lb 10.8 oz (68.8 kg) (as documented 05/04/2012) IBW/kg (Calculated) : 52.38  Vital Signs: Temp: 99 F (37.2 C) (04/03 1645) Temp src: Oral (04/03 1645) BP: 107/43 mmHg (04/03 1700) Pulse Rate: 89 (04/03 1645) Intake/Output from previous day:   Intake/Output from this shift: Total I/O In: 12.5 [Blood:12.5] Out: -   Labs:  Recent Labs  05/10/12 1330  WBC 18.6*  HGB 5.8*  PLT 565*  CREATININE 0.83   Estimated Creatinine Clearance: 59.6 ml/min (by C-G formula based on Cr of 0.83). No results found for this basename: VANCOTROUGH, VANCOPEAK, VANCORANDOM, GENTTROUGH, GENTPEAK, GENTRANDOM, TOBRATROUGH, TOBRAPEAK, TOBRARND, AMIKACINPEAK, AMIKACINTROU, AMIKACIN,  in the last 72 hours    Medical History: Past Medical History  Diagnosis Date  . Diabetes mellitus   . Renal disorder   . CP (cerebral palsy)   . Parkinsonism   . Dysphagia   . Recurrent aspiration pneumonia   . Bladder spasms   . Amputation of arm   . Recurrent UTI   . Renal cancer     s/p nephrectomy    Assessment:  72 yo bed-ridden female with multiple recent admissions for acute encephalopathy, r/o HCAP and UTI.  Pt was last discharged 3/28, completed a course of antibiotics but returned 4/3 with fever and no improvement in symptoms. Pt with h/o dysphagia on tube feeds (incr risk of aspiration. Pt received a dose of Vanc 1gm IV x 1 in the ED, MD added Cefepime empirically then now changing to Vancomycin and Zosyn for r/o suspected pneumonia.  CXR with improvement in bibasilar airspace dz compared to prior studies (March?).   WBC 18.6K, Scr 0.83 for CrCl of 60 ml/min and normalized CrCl of 73 ml/min.  Urine culture  and blood culture collected.  PCT 3.96.    Plan:   Vancomycin 750 mg IV q12h   Continue Zosyn 3.375 gm IV q8h as ordered by MD  Pharmacy will f/u  Geoffry Paradise, PharmD, BCPS Pager: [phone] 6:28 PM Pharmacy #: [id number]

## 2012-05-10 NOTE — Progress Notes (Signed)
pcp is Insurance claims handler

## 2012-05-10 NOTE — ED Provider Notes (Signed)
History     CSN: [id number]  Arrival date & time 05/10/12  1256   First MD Initiated Contact with Patient 05/10/12 1313      Chief Complaint  Patient presents with  . Fever    (Consider location/radiation/quality/duration/timing/severity/associated sxs/prior treatment) HPI Comments: 72 yo F with hx of Cerebral palsy, Parkinson's disease (bedbound) dysphagia with recurrent aspiration on tube feeds & DM; who has had multiple recent hospitalizations that presents emergency department from home for fever.  Patient was admitted from 3/2-3/15 and treated for HCAP (tx 16 days with antibiotics for pneumonia and started on antiepileptic medication), again 3/16-3/19 with unresponsiveness and fever, and most recently 3/22-3/28 for fever, unresponsiveness and likely urosepsis. Patient at that time was treated with IV vancomycin and cefepime with discharge on by mouth Levaquin x3 days.  Antibiotics were completed per EMS report. Pt is level V caveat, baseline unknown at this time as pt arrived alone.    Patient is a 72 y.o. female presenting with fever. The history is provided by medical records, a significant other and the EMS personnel. The history is limited by the condition of the patient. No language interpreter was used.  Fever Associated symptoms: no congestion, no cough, no dysuria, no headaches and no myalgias     Past Medical History  Diagnosis Date  . Diabetes mellitus   . Renal disorder   . CP (cerebral palsy)   . Parkinsonism   . Dysphagia   . Recurrent aspiration pneumonia   . Bladder spasms   . Amputation of arm   . Recurrent UTI   . Renal cancer     s/p nephrectomy    Past Surgical History  Procedure Laterality Date  . 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 Peg tube placement    . Knee surgery    . Nephrectomy      Family History  Problem Relation Age of Onset  . CAD Mother   . CAD Mother     History  Substance Use Topics  . Smoking status: Never  Smoker   . Smokeless tobacco: Never Used  . Alcohol Use: No    OB History   Grav Para Term Preterm Abortions TAB SAB Ect Mult Living                  Review of Systems  Unable to perform ROS Constitutional: Positive for fever. Negative for diaphoresis and activity change.  HENT: Negative for congestion and neck pain.   Respiratory: Negative for cough.   Genitourinary: Negative for dysuria.  Musculoskeletal: Negative for myalgias.  Skin: Negative for color change and wound.  Neurological: Negative for headaches.  All other systems reviewed and are negative.    Allergies  Codeine and Morphine and related  Home Medications   Current Outpatient Rx  Name  Route  Sig  Dispense  Refill  . antiseptic oral rinse (BIOTENE) LIQD   Mouth Rinse   15 mLs by Mouth Rinse route 2 (two) times daily.         . clonazePAM (KLONOPIN) 0.5 MG tablet   Per Tube   Place 1 tablet (0.5 mg total) into feeding tube at bedtime as needed for anxiety.   30 tablet   0   . diphenoxylate-atropine (LOMOTIL) 2.5-0.025 MG per tablet   Per Tube   Place 1 tablet into feeding tube 3 (three) times daily as needed. For diarrhea         .  fenofibrate (TRICOR) 145 MG tablet   Per Tube   Place 145 mg into feeding tube at bedtime.          . hydrALAZINE (APRESOLINE) 25 MG tablet   Per Tube   Place 1 tablet (25 mg total) into feeding tube 3 (three) times daily.   60 tablet   0   . HYDROcodone-acetaminophen (NORCO/VICODIN) 5-325 MG per tablet   Per Tube   Place 1 tablet into feeding tube 2 (two) times daily as needed for pain.   30 tablet   0   . hydrocortisone cream 1 %   Topical   Apply 1 application topically 3 (three) times daily as needed (for rash on neck/back.).         Marland Kitchen insulin aspart (NOVOLOG) 100 UNIT/ML injection   Subcutaneous   Inject 0-15 Units into the skin every 4 (four) hours. Before each meal 3 times a day, 140-199 - 2 units, 200-250 - 4 units, 251-299 - 6 units,  300-349  - 8 units,  350 or above 10 units.   1 vial   0   . insulin detemir (LEVEMIR) 100 UNIT/ML injection   Subcutaneous   Inject 0.4 mLs (40 Units total) into the skin 2 (two) times daily.   10 mL   1   . levETIRAcetam (KEPPRA) 1000 MG tablet   Per Tube   Place 1,000 mg into feeding tube 2 (two) times daily.         . metFORMIN (GLUCOPHAGE) 1000 MG tablet   Per Tube   Place 1,000 mg into feeding tube 2 (two) times daily with a meal.         . metoprolol tartrate (LOPRESSOR) 100 MG tablet   Per Tube   Place 1 tablet (100 mg total) into feeding tube 2 (two) times daily.   60 tablet   0   . Multiple Vitamin (MULTIVITAMIN WITH MINERALS) TABS   Per Tube   Place 1 tablet into feeding tube daily.   30 tablet   1   . Nutritional Supplements (FEEDING SUPPLEMENT, GLUCERNA 1.2 CAL,) LIQD   Per Tube   Place 237 mLs into feeding tube 5 (five) times daily.   237 mL   0     Please dispense enough for 1 month supply at above ...   . nystatin (MYCOSTATIN/NYSTOP) 100000 UNIT/GM POWD   Topical   Apply 1 g topically 3 (three) times daily as needed (for groin rash.).         Marland Kitchen PARoxetine (PAXIL) 40 MG tablet   Per Tube   Place 40 mg into feeding tube every morning.          . phenytoin (DILANTIN) 125 MG/5ML suspension   Per Tube   Place 8 mLs (200 mg total) into feeding tube 2 (two) times daily.   237 mL   0   . pregabalin (LYRICA) 150 MG capsule   Per Tube   Place 150 mg into feeding tube 2 (two) times daily.          . ranitidine (ZANTAC) 300 MG tablet   Per Tube   Place 300 mg into feeding tube at bedtime.            SpO2 94%  Physical Exam  Nursing note and vitals reviewed. Constitutional: She appears well-developed and well-nourished. No distress.  Febrile, normotensive on arrival  HENT:  Head: Normocephalic and atraumatic.  Eyes: Conjunctivae and EOM are normal. Pupils are equal, round,  and reactive to light. No scleral icterus.  Neck: Normal range of  motion. Neck supple.  Cardiovascular: Normal rate, regular rhythm, normal heart sounds and intact distal pulses.   Pulmonary/Chest: Effort normal. No respiratory distress. She has no wheezes. She has no rales. She exhibits no tenderness.  Abdominal: Soft. There is no tenderness.  PEG tube in place  Genitourinary: Guaiac positive stool.  Musculoskeletal: She exhibits no edema and no tenderness.  Left hand amputation  Neurological: She is alert. She displays a negative Romberg sign. GCS eye subscore is 3. GCS verbal subscore is 1. GCS motor subscore is 4.  Patient is alert and oriented to self. Able to follow simple commands. Unable to asses cranial nerves d/t pts inability to follow commands. No facial paralysis, unable to speak.   Skin: Skin is warm. No petechiae, no purpura and no rash noted. She is diaphoretic. There is pallor.  Psychiatric: Her speech is not slurred. Cognition and memory are impaired.    ED Course  Procedures (including critical care time)  Labs Reviewed  CBC WITH DIFFERENTIAL - Abnormal; Notable for the following:    WBC 18.6 (*)    RBC 2.02 (*)    Hemoglobin 5.8 (*)    HCT 18.1 (*)    Platelets 565 (*)    Neutro Abs 14.3 (*)    Monocytes Absolute 1.5 (*)    All other components within normal limits  COMPREHENSIVE METABOLIC PANEL - Abnormal; Notable for the following:    Sodium 130 (*)    Chloride 92 (*)    Glucose, Bld 165 (*)    BUN 34 (*)    Albumin 2.6 (*)    Total Bilirubin 0.2 (*)    GFR calc non Af Amer 70 (*)    GFR calc Af Amer 82 (*)    All other components within normal limits  OCCULT BLOOD, POC DEVICE - Abnormal; Notable for the following:    Fecal Occult Bld POSITIVE (*)    All other components within normal limits  CULTURE, BLOOD (ROUTINE X 2)  CULTURE, BLOOD (ROUTINE X 2)  URINE CULTURE  URINE CULTURE  LACTIC ACID, PLASMA  PROCALCITONIN  URINALYSIS, ROUTINE W REFLEX MICROSCOPIC  POCT I-STAT TROPONIN I  TYPE AND SCREEN  PREPARE RBC  (CROSSMATCH)  ABO/RH   Dg Chest 2 View  05/10/2012  *RADIOLOGY REPORT*  Clinical Data: Fever.  Renal cancer  CHEST - 2 VIEW  Comparison: 04/28/2012  Findings: Improvement in bibasilar airspace disease compared with the prior study.  There remains elevation of the right hemidiaphragm, and mild atelectasis in the lung bases.  Small left effusion is improved.  There is no heart failure or edema.  IMPRESSION: Improvement in bibasilar airspace disease and small left effusion. Negative for heart failure.   Original Report Authenticated By: Janeece Riggers, M.D.     Date: 05/10/2012  Rate: 94  Rhythm: normal sinus rhythm  QRS Axis: left  Intervals: normal  ST/T Wave abnormalities: normal  Conduction Disutrbances:none  Narrative Interpretation: Left Ant Fascicular Block  Old EKG Reviewed: unchanged  1:19 PM Pt arrived pale, diaphoretic, & febrile, but normotensive. Septic work up and cardiac wk up ordered. 2L IVFs started, blood cultures x2 and will order broad spectrum abx. Will discuss pt with attending for abx choice as pt has had many recent hospital admissions. Asked nurse to give tylenol suppository bc of increased risk of aspiration PNA.   1:59 PM Hgb of 5 called in. Type & screen, 2U PRBCs  ordered. Vanc & Cefepime ordered.  2:45PM Pt re-evaluated and sleeping, arroused with sternal rub, able to answer questions verbally   CRITICAL CARE Performed by: Jaci Carrel   Total critical care time: 30  Critical care time was exclusive of separately billable procedures and treating other patients.  Critical care was necessary to treat or prevent imminent or life-threatening deterioration.  Critical care was time spent personally by me on the following activities: development of treatment plan with patient and/or surrogate as well as nursing, discussions with consultants, evaluation of patient's response to treatment, examination of patient, obtaining history from patient or surrogate, ordering and  performing treatments and interventions, ordering and review of laboratory studies, ordering and review of radiographic studies, pulse oximetry and re-evaluation of patient's condition.  Medications  0.9 %  sodium chloride infusion (1,000 mLs Intravenous New Bag/Given 05/10/12 1346)    Followed by  0.9 %  sodium chloride infusion (1,000 mLs Intravenous New Bag/Given 05/10/12 1416)    Followed by  0.9 %  sodium chloride infusion (not administered)  ceFEPIme (MAXIPIME) 1 g in dextrose 5 % 50 mL IVPB (not administered)  pantoprazole (PROTONIX) injection 40 mg (not administered)  acetaminophen (TYLENOL) suppository 650 mg (650 mg Rectal Given 05/10/12 1345)  vancomycin (VANCOCIN) IVPB 1000 mg/200 mL premix (1,000 mg Intravenous New Bag/Given 05/10/12 1420)    1. Fever   2. Leukocytosis   3. GI bleed   4. Anemia   5. Hyponatremia      MDM  72 year old female with a history of diabetes, cerebral palsy and recurrent aspiration pneumonia presents emergency department with fever.  Patient has had many recent hospital admissions described as above and recently just finished a course of Levaquin for possible urosepsis as source of encephalopathy.  On arrival patient was febrile, diaphoretic, and pale with normal blood pressure.  Labs and imaging reviewed.  No obvious source of infection at this time. UA and culture still pending.  Patient found to be anemic with critical hemoglobin levels.  2 units of packed red blood cells started in emergency department and patient placed on broad-spectrum antibiotics as above.The patient appears reasonably stabilized for admission considering the current resources, flow, and capabilities available in the ED at this time, and I doubt any other [facility] requiring further screening and/or treatment in the ED prior to admission.         Jaci Carrel, [place] 05/10/12 1531

## 2012-05-10 NOTE — Progress Notes (Signed)
ANTIBIOTIC CONSULT NOTE - INITIAL  Pharmacy Consult for Cefepime Indication: empirically   Allergies  Allergen Reactions  . Codeine Nausea And Vomiting  . Morphine And Related Nausea And Vomiting    Patient Measurements: Height: 5' 2.99" (160 cm) (as documented 04/29/12) Weight: 151 lb 10.8 oz (68.8 kg) (as documented 05/04/2012) IBW/kg (Calculated) : 52.38  Vital Signs:   Intake/Output from previous day:   Intake/Output from this shift:    Labs:  Recent Labs  05/10/12 1330  WBC 18.6*  HGB 5.8*  PLT 565*  CREATININE 0.83   Estimated Creatinine Clearance: 59.6 ml/min (by C-G formula based on Cr of 0.83). No results found for this basename: VANCOTROUGH, VANCOPEAK, VANCORANDOM, GENTTROUGH, GENTPEAK, GENTRANDOM, TOBRATROUGH, TOBRAPEAK, TOBRARND, AMIKACINPEAK, AMIKACINTROU, AMIKACIN,  in the last 72 hours    Medical History: Past Medical History  Diagnosis Date  . Diabetes mellitus   . Renal disorder   . CP (cerebral palsy)   . Parkinsonism   . Dysphagia   . Recurrent aspiration pneumonia   . Bladder spasms   . Amputation of arm   . Recurrent UTI   . Renal cancer     s/p nephrectomy    Assessment:  72 yo bed-ridden female with multiple recent admissions for acute encephalopathy, r/o HCAP and UTI.  Pt was last discharged 3/28, completed a course of antibiotics but returned 4/3 with fever and no improvement in symptoms. Pt with h/o dysphagia on tube feeds (incr risk of aspiration. Pt received a dose of Vanc 1gm IV x 1 in the ED, MD adding Cefepime empirically - source of infection unknown at this time.   WBC 18.6K, Scr 0.83 for CrCl of 60 ml/min and normalized CrCl of 73 ml/min.  Urine culture and blood culture collected.  PCT 3.96.    Plan:   Cefepime 1gm IV q12h  Does patient need to continue Vancomycin?  Pharmacy will f/u  Geoffry Paradise, PharmD, BCPS Pager: [phone] 2:43 PM Pharmacy #: [phone]

## 2012-05-10 NOTE — Progress Notes (Signed)
ED CM spoke with Karin Golden at Dr [facility] office Melanie Crazier Pt is followed by this MD, does not have an upcoming appointment and the MD is not going out of business EPIC updated

## 2012-05-10 NOTE — ED Notes (Signed)
[id number]> Expected date:<BR> Expected time:<BR> Means of arrival:<BR> Comments:<BR> Fever-bed ridden pt

## 2012-05-10 NOTE — ED Notes (Signed)
Cefepime has not been given, receiving rn is aware and antibiotic sent up with pt.

## 2012-05-11 ENCOUNTER — Inpatient Hospital Stay (HOSPITAL_COMMUNITY): Payer: 59

## 2012-05-11 ENCOUNTER — Encounter (HOSPITAL_COMMUNITY): Payer: Self-pay | Admitting: Gastroenterology

## 2012-05-11 DIAGNOSIS — D649 Anemia, unspecified: Secondary | ICD-10-CM

## 2012-05-11 DIAGNOSIS — I10 Essential (primary) hypertension: Secondary | ICD-10-CM

## 2012-05-11 LAB — COMPREHENSIVE METABOLIC PANEL
ALT: 16 U/L (ref 0–35)
AST: 20 U/L (ref 0–37)
Albumin: 2.2 g/dL — ABNORMAL LOW (ref 3.5–5.2)
Alkaline Phosphatase: 57 U/L (ref 39–117)
Chloride: 99 mEq/L (ref 96–112)
Potassium: 4.2 mEq/L (ref 3.5–5.1)
Sodium: 135 mEq/L (ref 135–145)
Total Bilirubin: 0.2 mg/dL — ABNORMAL LOW (ref 0.3–1.2)
Total Protein: 6.7 g/dL (ref 6.0–8.3)

## 2012-05-11 LAB — URINALYSIS, MICROSCOPIC ONLY
Glucose, UA: NEGATIVE mg/dL
Ketones, ur: NEGATIVE mg/dL
Protein, ur: 100 mg/dL — AB
Urobilinogen, UA: 1 mg/dL (ref 0.0–1.0)

## 2012-05-11 LAB — RAPID URINE DRUG SCREEN, HOSP PERFORMED
Amphetamines: NOT DETECTED
Tetrahydrocannabinol: NOT DETECTED

## 2012-05-11 LAB — CBC
HCT: 28.7 % — ABNORMAL LOW (ref 36.0–46.0)
MCH: 28.6 pg (ref 26.0–34.0)
MCHC: 32.8 g/dL (ref 30.0–36.0)
MCV: 87.2 fL (ref 78.0–100.0)
RDW: 16.7 % — ABNORMAL HIGH (ref 11.5–15.5)

## 2012-05-11 LAB — APTT: aPTT: 33 seconds (ref 24–37)

## 2012-05-11 LAB — GLUCOSE, CAPILLARY
Glucose-Capillary: 189 mg/dL — ABNORMAL HIGH (ref 70–99)
Glucose-Capillary: 221 mg/dL — ABNORMAL HIGH (ref 70–99)
Glucose-Capillary: 230 mg/dL — ABNORMAL HIGH (ref 70–99)

## 2012-05-11 LAB — PROTIME-INR: INR: 1.32 (ref 0.00–1.49)

## 2012-05-11 MED ORDER — ADULT MULTIVITAMIN W/MINERALS CH
1.0000 | ORAL_TABLET | Freq: Every morning | ORAL | Status: DC
  Administered 2012-05-12 – 2012-05-16 (×5): 1 via ORAL
  Filled 2012-05-11 (×5): qty 1

## 2012-05-11 MED ORDER — CLONAZEPAM 0.5 MG PO TABS: 0.5000 mg | ORAL_TABLET | Freq: Every evening | ORAL | Status: DC | PRN

## 2012-05-11 MED ORDER — INSULIN DETEMIR 100 UNIT/ML ~~LOC~~ SOLN
25.0000 [IU] | Freq: Two times a day (BID) | SUBCUTANEOUS | Status: DC
  Administered 2012-05-11 – 2012-05-12 (×2): 25 [IU] via SUBCUTANEOUS
  Filled 2012-05-11 (×3): qty 0.25

## 2012-05-11 MED ORDER — GLUCERNA 1.2 CAL PO LIQD
237.0000 mL | Freq: Every day | ORAL | Status: DC
  Administered 2012-05-11 – 2012-05-16 (×25): 237 mL
  Filled 2012-05-11 (×32): qty 237

## 2012-05-11 MED ORDER — FENOFIBRATE 160 MG PO TABS
160.0000 mg | ORAL_TABLET | Freq: Every day | ORAL | Status: DC
  Administered 2012-05-11 – 2012-05-16 (×6): 160 mg
  Filled 2012-05-11 (×6): qty 1

## 2012-05-11 MED ORDER — VANCOMYCIN HCL 500 MG IV SOLR
500.0000 mg | Freq: Two times a day (BID) | INTRAVENOUS | Status: DC
  Administered 2012-05-11 – 2012-05-14 (×6): 500 mg via INTRAVENOUS
  Filled 2012-05-11 (×7): qty 500

## 2012-05-11 MED ORDER — FAMOTIDINE 40 MG PO TABS
40.0000 mg | ORAL_TABLET | Freq: Every day | ORAL | Status: DC
  Administered 2012-05-11 – 2012-05-15 (×5): 40 mg
  Filled 2012-05-11 (×5): qty 1

## 2012-05-11 MED ORDER — GLUCERNA 1.2 CAL PO LIQD: 237.0000 mL | Freq: Every day | ORAL | Status: DC

## 2012-05-11 MED ORDER — SODIUM CHLORIDE 0.9 % IV SOLN
1000.0000 mL | INTRAVENOUS | Status: DC
  Administered 2012-05-13 – 2012-05-16 (×4): 1000 mL via INTRAVENOUS

## 2012-05-11 NOTE — Progress Notes (Signed)
ANTIBIOTIC CONSULT NOTE - INITIAL  Pharmacy Consult for Vancomycin Indication: suspected pneumonia  Allergies  Allergen Reactions  . Codeine Nausea And Vomiting  . Morphine And Related Nausea And Vomiting    Patient Measurements: Height: 5\' 3"  (160 cm) Weight: 155 lb 3.3 oz (70.4 kg) IBW/kg (Calculated) : 52.4  Vital Signs: Temp: 99.6 F (37.6 C) (04/04 0700) Temp src: Oral (04/04 0700) BP: 127/62 mmHg (04/04 0700) Pulse Rate: 92 (04/04 0700) Intake/Output from previous day: 04/03 0701 - 04/04 0700 In: 625 [I.V.:250; Blood:325; IV Piggyback:50] Out: 400 [Urine:400] Intake/Output from this shift:    Labs:  Recent Labs  05/10/12 1330 05/11/12 0930  WBC 18.6* 7.1  HGB 5.8* 9.4*  PLT 565* 322  CREATININE 0.83 0.64   Estimated Creatinine Clearance: 62.4 ml/min (by C-G formula based on Cr of 0.64). No results found for this basename: VANCOTROUGH, VANCOPEAK, VANCORANDOM, GENTTROUGH, GENTPEAK, GENTRANDOM, TOBRATROUGH, TOBRAPEAK, TOBRARND, AMIKACINPEAK, AMIKACINTROU, AMIKACIN,  in the last 72 hours    Medical History: Past Medical History  Diagnosis Date  . Diabetes mellitus   . Renal disorder   . CP (cerebral palsy)   . Parkinsonism   . Dysphagia   . Recurrent aspiration pneumonia   . Bladder spasms   . Amputation of arm   . Recurrent UTI   . Renal cancer     s/p nephrectomy    Assessment:  72 yo bed-ridden female with multiple recent admissions for acute encephalopathy, r/o HCAP and UTI.  Pt was last discharged 3/28, completed a course of antibiotics but returned 4/3 with fever and no improvement in symptoms. Pt with h/o dysphagia on tube feeds (incr risk of aspiration. Pt received a dose of Vanc 1gm IV x 1 in the ED, MD added Cefepime empirically then now changing to Vancomycin and Zosyn for r/o suspected pneumonia.  CXR with improvement in bibasilar airspace dz compared to prior studies (March?).   4/3 Cefepime x 1  4/3 >> Vanc >> 4/3 >> Zosyn (MD)  ??  Tmax: 101.8 WBCs: 18.6K -->7.1 Renal: Scr wnl, CG CrCl 60 4/3 PCT: 3.96  4/3 blood >> NGTD 4/3 urine >> pending  Dose changes/drug level info: 3/25 (prev admit): VT = 23.18mcg/ml on 750mg  IV q12h  Plan:   Based on previous trough level at end of march, change vancomycin to 500mg  IV q12h  Continue Zosyn 3.375 gm IV q8h as ordered by MD  Pharmacy will f/u  Juliette Alcide, PharmD, BCPS.   Pager: [phone]  05/11/2012 11:27 AM

## 2012-05-11 NOTE — Progress Notes (Signed)
INITIAL NUTRITION ASSESSMENT  DOCUMENTATION CODES Per approved criteria  -Not Applicable   INTERVENTION: Pt currently has order for 5 cans of Glucerna Shakes daily which provides 1100 kcal and 50 grams of protein which meets 71% of estimated energy needs and 60% of estimated protein needs.  Recommend Glucerna 1.2 via PEG 6 cans/day which will provide 1706 calories, 85g protein, free water meeting 100% estimated calorie needs, 100% estimated protein needs. Recommend 30 ml free water flushes before and after each bolus feed. (If IV fluids are discontinued recommend 80 ml free water flushes before and after each bolus feed).   NUTRITION DIAGNOSIS: 1) Inadequate oral intake related to history of dysphagia and aspiration pneumonia as evidenced by pt with PEG using TF as main source of nutrition.  2) Unintentional wt loss related to inadequate tube feeds and frequent hospitalizations as evidenced by 10% wt loss in less than 3 months.  Goal: Pt to meet >/= 90% of their estimated nutrition needs;  Monitor:  TF regimen/adequacy Wt Labs  Reason for Assessment: MST  72 y.o. female  Admitting Dx: Fever and altered mental status  ASSESSMENT: 72 y.o. year-old female with history of CP, possible MR, Parkinsonism, T2DM, renal cancer, dysphagia with recurrent aspiration pneumonia, bladder spasms who presents with altered mental status and fever. Today's admission represents the patient's fourth admission in the past 6 weeks. The patient was most recently discharged from the hospital after an admission on March 23-March 27 for UTI and healthcare associated pneumonia. The patient was discharged with 3 days of Levaquin. At baseline, the patient is bedbound and requires full assist for all her activities of daily living from her husband. Pt reports that she has been receiving bolus feeds at home and drinking some liquids by mouth. RN reports that pt has had multiple admission due to aspiration  pneumonia. Pt is currently getting Glucerna shakes via PEG per RN. Pt does not know her usual body weight but, states that she is satisfied with the amount of tube feed volume she has been receiving.    Height: Ht Readings from Last 1 Encounters:  05/10/12 5\' 3"  (1.6 m)    Weight: Wt Readings from Last 1 Encounters:  05/10/12 155 lb 3.3 oz (70.4 kg)    Ideal Body Weight: 115 lbs  % Ideal Body Weight: 135%  Wt Readings from Last 10 Encounters:  05/10/12 155 lb 3.3 oz (70.4 kg)  05/04/12 151 lb 9.6 oz (68.765 kg)  04/19/12 163 lb 2.3 oz (74 kg)  03/05/12 172 lb 11.2 oz (78.336 kg)    Usual Body Weight: unknown  (172 lbs on 03/05/12)  % Usual Body Weight: 90%  BMI:  Body mass index is 27.5 kg/(m^2).  Estimated Nutritional Needs: Kcal: 1550-1810  Protein: 84-106 grams  Fluid: 2.1-2.3 L   Skin: +1 Generalized edema, +1 RUE, RLE, LLE edema; ecchymosis on arms; unstageable ulcer on ear, stage 2 ulcer on coccyx, wound on sacrum and buttocks  Diet Order: Clear Liquid  EDUCATION NEEDS: -No education needs identified at this time   Intake/Output Summary (Last 24 hours) at 05/11/12 1423 Last data filed at 05/11/12 1422  Gross per 24 hour  Intake    625 ml  Output    400 ml  Net    225 ml    Last BM: 4/4  Labs:   Recent Labs Lab 05/10/12 1330 05/11/12 0930  NA 130* 135  K 4.7 4.2  CL 92* 99  CO2 26 24  BUN  34* 27*  CREATININE 0.83 0.64  CALCIUM 10.1 8.6  GLUCOSE 165* 219*    CBG (last 3)   Recent Labs  05/11/12 0032 05/11/12 0748 05/11/12 1211  GLUCAP 189* 214* 207*    Scheduled Meds: . antiseptic oral rinse  15 mL Mouth Rinse BID  . feeding supplement  237 mL Oral 5 X Daily  . free water  200 mL Per Tube Q6H  . insulin aspart  0-15 Units Subcutaneous TID WC  . insulin aspart  0-5 Units Subcutaneous QHS  . levETIRAcetam  1,000 mg Per Tube BID  . metoprolol tartrate  12.5 mg Oral BID  . pantoprazole (PROTONIX) IV  40 mg Intravenous Once  .  PARoxetine  40 mg Per Tube q morning - 10a  . phenytoin  200 mg Per Tube BID  . piperacillin-tazobactam (ZOSYN)  IV  3.375 g Intravenous Q8H  . pregabalin  150 mg Oral BID  . sodium chloride  3 mL Intravenous Q12H  . vancomycin  500 mg Intravenous Q12H    Continuous Infusions: . sodium chloride    . sodium chloride    . sodium chloride 75 mL/hr at 05/11/12 1401    Past Medical History  Diagnosis Date  . Diabetes mellitus   . Renal disorder   . CP (cerebral palsy)   . Parkinsonism   . Dysphagia   . Recurrent aspiration pneumonia   . Bladder spasms   . Amputation of arm   . Recurrent UTI   . Renal cancer     s/p nephrectomy    Past Surgical History  Procedure Laterality Date  . 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 Peg tube placement    . Knee surgery    . Nephrectomy      Ian Malkin RD, LDN Inpatient Clinical Dietitian Pager: [phone] After Hours Pager: [phone]

## 2012-05-11 NOTE — Progress Notes (Signed)
Unable to contact pt husband. When ask pt if she drank liquids at home, pt stated " Yes, I drink Coke at home." I ask th pt if she ever coughed after drink liquids, pt states" I cough about 30 seconds after I drink something. Bedside swallow evaluation placed per discussion with Dr. Ewing Schlein.

## 2012-05-11 NOTE — ED Provider Notes (Signed)
Shared service with midlevel provider. I have personally seen and examined the patient, providing direct face to face care, presenting with the chief complaint of fever. Physical exam findings include febrile patient, with may be some confusion, + diophoresis. Plan will be to admit for further workup. Suspect bacteremia. No meningeal signs appreciated.  I have reviewed the nursing documentation on past medical history, family history, and social history.  Derwood Kaplan, MD 05/11/12 [phone]

## 2012-05-11 NOTE — Progress Notes (Signed)
   CARE MANAGEMENT NOTE 05/11/2012  Patient:  [facility]   Account Number:  [id number]  Date Initiated:  05/11/2012  Documentation initiated by:  Jiles Crocker  Subjective/Objective Assessment:   ADMITTED WITH ACUTE ENCEPHALOPATHY     Action/Plan:   PCP: Johny Blamer, MD  LIVES AT HOME WITH SPOUSE; IS ACTIVE WITH [facility]   Anticipated DC Date:  05/16/2012   Anticipated DC Plan:  HOME W HOME HEALTH SERVICES      DC Planning Services  CM consult         Status of service:  In process, will continue to follow Medicare Important Message given?  NA - LOS <3 / Initial given by admissions (If response is "NO", the following Medicare IM given date fields will be blank) Per UR Regulation:  Reviewed for med. necessity/level of care/duration of stay Comments:  05/11/2012- B Regan Mcbryar RN,BSN,MHA

## 2012-05-11 NOTE — Progress Notes (Addendum)
[facility] PROGRESS NOTE  [name]:[id number] DOB: 12/14/1940 DOA: 05/10/2012 PCP: Johny Blamer, MD  Brief narrative 72 year old female patient with history of CP, possible MR, parkinsonism, DM 2, renal cancer, dysphagia with recurrent aspiration pneumonia, bladder spasms, multiple recent hospitalizations, presented on 05/10/2012 with altered mental status and fever of 101.74F. She was recently treated for UTI and HCAP. At baseline patient apparently is bed bound and requires full assist for all activities of daily living. In the emergency department, hemoglobin was 5.8 and she was transfused 2 units of PRBCs. Empiric broad-spectrum IV antibiotics-vancomycin and cefepime were started.  Assessment/Plan: 1. Acute encephalopathy: Likely secondary to acute medical illness (fever, anemia, dehydration, AKI) complicating underlying CP/MR. Patient is a alert and converses today and denies complaints. She may have reached her baseline mental status. Monitor. 2. Anemia: Unclear etiology. Hemoccult-positive in ED. No overt source of bleeding otherwise. GI consultation appreciated and they will discuss with family regarding need for further workup. Improved after 2 units PRBC. Follow CBC. 3. Fever and leukocytosis: Unclear etiology. Continue empiric IV vancomycin and Zosyn pending culture results. CT chest - R>L pleural effusion, basilar Atx Vs infiltrate. ? Another episode of aspiration pneumonia. 4. Hypertension: Controlled. Blood pressures were soft in ED. Hydralazine held. Continue low-dose metoprolol. 5. Type 2 diabetes mellitus: Uncontrolled. Resumed Levemir and continue SSI. 6. Chest discomfort: Resolved. Troponins negative. Leukocytosis resolved.  Code Status: Full Family Communication: Discussed with spouse via phone. Agreeable to palliative care consult for goals of care. Disposition Plan: Home when medically  stable.   Consultants:  Gastroenterology  Procedures:  None  Antibiotics:  IV vancomycin 4/3 >  IV Zosyn 4/3 >   HPI/Subjective: Patient complains pain on right ear-has a tiny pressure sore on pinna. Denies chest pain. Per nursing, no acute events.  Objective: Filed Vitals:   05/11/12 0400 05/11/12 0500 05/11/12 0600 05/11/12 0700  BP: 130/61 137/64 129/63 127/62  Pulse: 83 87 93 92  Temp: 97.6 F (36.4 C) 98.4 F (36.9 C) 98.3 F (36.8 C) 99.6 F (37.6 C)  TempSrc: Axillary Axillary Oral Oral  Resp: 16 16 16 16   Height:      Weight:      SpO2:        Intake/Output Summary (Last 24 hours) at 05/11/12 1054 Last data filed at 05/11/12 0500  Gross per 24 hour  Intake    625 ml  Output    400 ml  Net    225 ml   Filed Weights   05/10/12 1316 05/10/12 1755  Weight: 68.8 kg (151 lb 10.8 oz) 70.4 kg (155 lb 3.3 oz)    Exam:   General exam: Comfortable.  Respiratory system: Reduced breath sounds in the bases but otherwise clear to auscultation. No increased work of breathing.  Cardiovascular system: S1 & S2 heard, RRR. No JVD, murmurs, gallops, clicks or pedal edema.  Gastrointestinal system: Abdomen is nondistended, soft and nontender. Normal bowel sounds heard. PEG tube intact.  Central nervous system: Alert and oriented to person and place. No focal neurological deficits.  Ears: Tiny approximately 0.5 cm dark spot on right pinna. No other acute findings.   Data Reviewed: Basic Metabolic Panel:  Recent Labs Lab 05/10/12 1330 05/11/12 0930  NA 130* 135  K 4.7 4.2  CL 92* 99  CO2 26 24  GLUCOSE 165* 219*  BUN 34* 27*  CREATININE 0.83 0.64  CALCIUM 10.1 8.6   Liver Function Tests:  Recent Labs Lab 05/10/12 1330 05/11/12 0930  AST 23 20  ALT 16 16  ALKPHOS 70 57  BILITOT 0.2* 0.2*  PROT 8.1 6.7  ALBUMIN 2.6* 2.2*   No results found for this basename: LIPASE, AMYLASE,  in the last 168 hours No results found for this basename:  AMMONIA,  in the last 168 hours CBC:  Recent Labs Lab 05/10/12 1330 05/11/12 0930  WBC 18.6* 7.1  NEUTROABS 14.3*  --   HGB 5.8* 9.4*  HCT 18.1* 28.7*  MCV 89.6 87.2  PLT 565* 322   Cardiac Enzymes:  Recent Labs Lab 05/11/12 0045 05/11/12 0930  TROPONINI <0.30 <0.30   BNP (last 3 results) No results found for this basename: PROBNP,  in the last 8760 hours CBG:  Recent Labs Lab 05/04/12 1122 05/04/12 1607 05/11/12 0032 05/11/12 0748  GLUCAP 299* 226* 189* 214*    Recent Results (from the past 240 hour(s))  CLOSTRIDIUM DIFFICILE BY PCR     Status: None   Collection Time    05/01/12 12:33 PM      Result Value Range Status   C difficile by pcr NEGATIVE  NEGATIVE Final  CULTURE, BLOOD (ROUTINE X 2)     Status: None   Collection Time    05/10/12  1:30 PM      Result Value Range Status   Specimen Description BLOOD RIGHT ARM   Final   Special Requests BOTTLES DRAWN AEROBIC AND ANAEROBIC 4CC   Final   Culture  Setup Time 05/10/2012 17:09   Final   Culture     Final   Value:        BLOOD CULTURE RECEIVED NO GROWTH TO DATE CULTURE WILL BE HELD FOR 5 DAYS BEFORE ISSUING A FINAL NEGATIVE REPORT   Report Status PENDING   Incomplete  CULTURE, BLOOD (ROUTINE X 2)     Status: None   Collection Time    05/10/12  1:45 PM      Result Value Range Status   Specimen Description BLOOD RIGHT ARM   Final   Special Requests BOTTLES DRAWN AEROBIC AND ANAEROBIC [facility]   Final   Culture  Setup Time 05/10/2012 17:09   Final   Culture     Final   Value:        BLOOD CULTURE RECEIVED NO GROWTH TO DATE CULTURE WILL BE HELD FOR 5 DAYS BEFORE ISSUING A FINAL NEGATIVE REPORT   Report Status PENDING   Incomplete     Studies: Dg Chest 2 View  05/10/2012  *RADIOLOGY REPORT*  Clinical Data: Fever.  Renal cancer  CHEST - 2 VIEW  Comparison: 04/28/2012  Findings: Improvement in bibasilar airspace disease compared with the prior study.  There remains elevation of the right hemidiaphragm, and mild  atelectasis in the lung bases.  Small left effusion is improved.  There is no heart failure or edema.  IMPRESSION: Improvement in bibasilar airspace disease and small left effusion. Negative for heart failure.   Original Report Authenticated By: Janeece Riggers, M.D.    Ct Chest Wo Contrast  05/11/2012  *RADIOLOGY REPORT*  Clinical Data: Basilar infiltrates.  Aspiration pneumonia.  Altered mental status.  CT CHEST WITHOUT CONTRAST  Technique:  Multidetector CT imaging of the chest was performed following the standard protocol without IV contrast.  Comparison: None.  Findings: Right-sided central venous catheter with tip in the superior vena cava.  Mild cardiac enlargement with small pericardial effusion.  Normal caliber thoracic aorta.  Mediastinal lymph nodes are present without pathologic enlargement.  Small right pleural  effusion with atelectasis or infiltration in the right lung base.  Mild atelectasis or infiltration in the left lung base.  Changes are nonspecific but would be compatible with aspiration in the appropriate clinical setting.  Airways are patent.  No pneumothorax.  Degenerative changes in the thoracic spine. Nonspecific area of low attenuation in the medial segment left lobe of the liver which could represent focal fatty infiltration.  Similar appearance as demonstrated on previous CT abdomen and pelvis from 03/03/2012.  IMPRESSION: Right pleural effusion with bilateral basilar atelectasis or infiltration.  Small pericardial effusion.   Original Report Authenticated By: Burman Nieves, M.D.    Dg Chest Port 1 View  05/10/2012  *RADIOLOGY REPORT*  Clinical Data: Line placement  PORTABLE CHEST - 1 VIEW  Comparison: 1444 hours  Findings: Right upper extremity PICC tip in the mid SVC.  Right pleural effusion worse.  Left lung is clear.  Normal heart size. No pneumothorax. Left pleural effusion improved.  Left basilar atelectasis improved.  IMPRESSION: Right PICC tip at the SVC.  Worsening right  effusion.  Left pleural effusion and basilar atelectasis improved.   Original Report Authenticated By: Jolaine Click, M.D.      Additional labs:   Scheduled Meds: . antiseptic oral rinse  15 mL Mouth Rinse BID  . feeding supplement  237 mL Oral 5 X Daily  . free water  200 mL Per Tube Q6H  . insulin aspart  0-15 Units Subcutaneous TID WC  . insulin aspart  0-5 Units Subcutaneous QHS  . levETIRAcetam  1,000 mg Per Tube BID  . metoprolol tartrate  12.5 mg Oral BID  . pantoprazole (PROTONIX) IV  40 mg Intravenous Once  . PARoxetine  40 mg Per Tube q morning - 10a  . phenytoin  200 mg Per Tube BID  . piperacillin-tazobactam (ZOSYN)  IV  3.375 g Intravenous Q8H  . pregabalin  150 mg Oral BID  . sodium chloride  3 mL Intravenous Q12H  . vancomycin  750 mg Intravenous Q12H   Continuous Infusions: . sodium chloride    . sodium chloride    . sodium chloride 75 mL/hr at 05/10/12 1910    Active Problems:   Fever   Diabetes mellitus   Protein-calorie malnutrition, severe   Acute encephalopathy   Hyponatremia   Acute blood loss anemia    Time spent: 50 minutes    [facility]  [facility] Pager [phone].   If 8PM-8AM, please contact night-coverage at [email or web address], password [facility] 05/11/2012, 10:54 AM  LOS: 1 day

## 2012-05-11 NOTE — Evaluation (Signed)
Clinical/Bedside Swallow Evaluation Patient Details  Name: STEFFANIE MINGLE MRN: [id number] Date of Birth: 23-Mar-1940  Today's Date: 05/11/2012 Time: 7829-5621 SLP Time Calculation (min): 34 min  Past Medical History:  Past Medical History  Diagnosis Date  . Diabetes mellitus   . Renal disorder   . CP (cerebral palsy)   . Parkinsonism   . Dysphagia   . Recurrent aspiration pneumonia   . Bladder spasms   . Amputation of arm   . Recurrent UTI   . Renal cancer     s/p nephrectomy   Past Surgical History:  Past Surgical History  Procedure Laterality Date  . 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 Peg tube placement    . Knee surgery    . Nephrectomy     HPI:  72 yo female adm to [facility] with acute encephalopathy, Pt has complex medical history including cerebral palsy, Parkinsonism, dysphagia, ? recurrent aspiration pnas, DM, recurrent UTIs, renal cancer and she is bedridden.  Pt has a PEG tube but is unable to state when it was placed.  Pt being followed by GI for guaiac positivity and multiple medical problem and anemia.  Pt resides at home with spouse and she reports consuming po by mouth in addition to using tube.  Recurrent hospital admits noted x4 within 5 weeks.  Pt is a full code.  order was received for clinical swallow evaluation, spouse not present.     Assessment / Plan / Recommendation Clinical Impression  Pt known to this SLP from previous swallow evaluation in 2011.  No overt clinical indication of aspiration with thin via tsp or straw - intermittent delay in swallow initiation noted however.  Poor positioning due to pt's CP and known dysphagia increases pt's aspiration risk with decreased ability to compensate due to her multiple medical issues.    Pt stated she has not used thickener in drinks previously and overtly expressed desire to consume po with accepted aspiration risk.  Unfortunately spouse not present during testing and note that RN has been  unable to reach him.    Do not anticipate pt and spouse will follow dietary modifications if recommended and this pt is at chronic aspiration/aspiration pna risk.  Question if pt would benefit from consideration for palliative referral given recurrent hospitalizations.     SLP to follow briefly to educate spouse and pt to aspiration precautions to decrease aspiration risk.   SLP educated pt to previous testing results, chronicity of dysphagia/aspiration risk - including risk of aspirating secretions.  Pt states her tube is not "helping her" as she "keeps ending up in the hospital".        Aspiration Risk  Severe    Diet Recommendation Thin liquid (with risk accepted for comfort!- liquids)   Liquid Administration via: Straw (use straw for oral control) Medication Administration: Via alternative means Supervision: Staff feed patient Compensations: Small sips/bites Postural Changes and/or Swallow Maneuvers: Seated upright 90 degrees;Upright 30-60 min after meal    Other  Recommendations Oral Care Recommendations: Oral care before and after PO   Follow Up Recommendations  None    Frequency and Duration min 1 x/week  1 week   Pertinent Vitals/Pain Afebrile, decreased, non protective cough    SLP Swallow Goals Patient will utilize recommended strategies during swallow to increase swallowing safety with: Maximal cueing   Swallow Study Prior Functional Status   uncertain, pt states she consumed po at home, suspect chronic episodic  aspiration ongoing    General HPI: 72 yo female adm to [facility] with acute encephalopathy, Pt has complex medical history including cerebral palsy, Parkinsonism, dysphagia, ? recurrent aspiration pnas, DM, recurrent UTIs, renal cancer and she is bedridden.  Pt has a PEG tube but is unable to state when it was placed.  Pt being followed by GI for guaiac positivity and multiple medical problem and anemia.  Pt resides at home with spouse and she reports consuming po by  mouth in addition to using tube.  Recurrent hospital admits noted x4 within 5 weeks.  Pt is a full code.  order was received for clinical swallow evaluation, spouse not present.   Type of Study: Bedside swallow evaluation Previous Swallow Assessment: MBS studies - multiple studies completed, last being in 2011July with recommendations for puree/honey due to lethargy and silent aspiration of thin via tsp, previous study May 2011 recommended puree/thin with trace asp of thin via tsp and delays in swallow response Diet Prior to this Study: Thin liquids (clears) Temperature Spikes Noted: No Respiratory Status: Room air History of Recent Intubation: No Behavior/Cognition: Alert Oral Cavity - Dentition: Adequate natural dentition (upper fewer dentition) Self-Feeding Abilities: Total assist Patient Positioning: Postural control interferes with function (pt with head turned right, unable to hold head upright) Baseline Vocal Quality: Low vocal intensity Volitional Cough: Weak Volitional Swallow: Unable to elicit    Oral/Motor/Sensory Function Overall Oral Motor/Sensory Function: Appears within functional limits for tasks assessed (no focal cn deficits)   Ice Chips Ice chips: Not tested   Thin Liquid Thin Liquid: Impaired Presentation: Straw;Spoon Oral Phase Impairments: Reduced labial seal;Reduced lingual movement/coordination Oral Phase Functional Implications: Prolonged oral transit;Right anterior spillage Pharyngeal  Phase Impairments: Suspected delayed Swallow    Nectar Thick Nectar Thick Liquid: Not tested   Honey Thick Honey Thick Liquid: Not tested   Puree Puree: Not tested   Solid   GO    Solid: Not tested       Donavan Burnet, MS [facility] SLP [phone]

## 2012-05-11 NOTE — Consult Note (Signed)
Reason for Consult: Guaiac positive anemia Referring Physician: Hospital team  [name] is an 72 y.o. female.  HPI: Unfortunate patient totally bedridden without any obvious GI complaints and although she thinks she had a colonoscopy before I cannot find it on the computer and the nurse is having trouble getting in touch with the husband and she has not seen any blood and has no GI complaint and says she drinks clear liquid but also uses her tube feeds at home and her 2 previous endoscopies were reviewed and she did have a CT in January which was okay and she says her family history is negative for any GI problems and I do not think she is on any aspirin or blood thinners at home and she's had multiple guaiac-negative stools in the hospital in the past however did have a decreased hemoglobin and was guaiac positive this admission although she just had a normal brown formed stool and she has no other complaint Past Medical History  Diagnosis Date  . Diabetes mellitus   . Renal disorder   . CP (cerebral palsy)   . Parkinsonism   . Dysphagia   . Recurrent aspiration pneumonia   . Bladder spasms   . Amputation of arm   . Recurrent UTI   . Renal cancer     s/p nephrectomy    Past Surgical History  Procedure Laterality Date  . 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 Peg tube placement    . Knee surgery    . Nephrectomy      Family History  Problem Relation Age of Onset  . CAD Mother   . CAD Mother     Social History:  reports that she has never smoked. She has never used smokeless tobacco. She reports that she does not drink alcohol or use illicit drugs.  Allergies:  Allergies  Allergen Reactions  . Codeine Nausea And Vomiting  . Morphine And Related Nausea And Vomiting    Medications: I have reviewed the patient's current medications.  Results for orders placed during the hospital encounter of 05/10/12 (from the past 48 hour(s))  CULTURE, BLOOD  (ROUTINE X 2)     Status: None   Collection Time    05/10/12  1:30 PM      Result Value Range   Specimen Description BLOOD RIGHT ARM     Special Requests BOTTLES DRAWN AEROBIC AND ANAEROBIC 4CC     Culture  Setup Time 05/10/2012 17:09     Culture       Value:        BLOOD CULTURE RECEIVED NO GROWTH TO DATE CULTURE WILL BE HELD FOR 5 DAYS BEFORE ISSUING A FINAL NEGATIVE REPORT   Report Status PENDING    CBC WITH DIFFERENTIAL     Status: Abnormal   Collection Time    05/10/12  1:30 PM      Result Value Range   WBC 18.6 (*) 4.0 - 10.5 K/uL   RBC 2.02 (*) 3.87 - 5.11 MIL/uL   Hemoglobin 5.8 (*) 12.0 - 15.0 g/dL   Comment: REPEATED TO VERIFY     CRITICAL RESULT CALLED TO, READ BACK BY AND VERIFIED WITH:     D MERRITT AT 1350 ON 04.03.2014 BY NBROOKS   HCT 18.1 (*) 36.0 - 46.0 %   MCV 89.6  78.0 - 100.0 fL   MCH 28.7  26.0 - 34.0 pg   MCHC 32.0  30.0 -  36.0 g/dL   RDW 16.1  09.6 - 04.5 %   Platelets 565 (*) 150 - 400 K/uL   Neutrophils Relative 77  43 - 77 %   Lymphocytes Relative 15  12 - 46 %   Monocytes Relative 8  3 - 12 %   Eosinophils Relative 0  0 - 5 %   Basophils Relative 0  0 - 1 %   Neutro Abs 14.3 (*) 1.7 - 7.7 K/uL   Lymphs Abs 2.8  0.7 - 4.0 K/uL   Monocytes Absolute 1.5 (*) 0.1 - 1.0 K/uL   Eosinophils Absolute 0.0  0.0 - 0.7 K/uL   Basophils Absolute 0.0  0.0 - 0.1 K/uL  COMPREHENSIVE METABOLIC PANEL     Status: Abnormal   Collection Time    05/10/12  1:30 PM      Result Value Range   Sodium 130 (*) 135 - 145 mEq/L   Potassium 4.7  3.5 - 5.1 mEq/L   Chloride 92 (*) 96 - 112 mEq/L   CO2 26  19 - 32 mEq/L   Glucose, Bld 165 (*) 70 - 99 mg/dL   BUN 34 (*) 6 - 23 mg/dL   Creatinine, Ser 4.09  0.50 - 1.10 mg/dL   Calcium 81.1  8.4 - 91.4 mg/dL   Total Protein 8.1  6.0 - 8.3 g/dL   Albumin 2.6 (*) 3.5 - 5.2 g/dL   AST 23  0 - 37 U/L   ALT 16  0 - 35 U/L   Alkaline Phosphatase 70  39 - 117 U/L   Total Bilirubin 0.2 (*) 0.3 - 1.2 mg/dL   GFR calc non Af Amer 70  (*) >90 mL/min   GFR calc Af Amer 82 (*) >90 mL/min   Comment:            The eGFR has been calculated     using the CKD EPI equation.     This calculation has not been     validated in all clinical     situations.     eGFR's persistently     <90 mL/min signify     possible Chronic Kidney Disease.  PROCALCITONIN     Status: None   Collection Time    05/10/12  1:30 PM      Result Value Range   Procalcitonin 3.96     Comment:            Interpretation:     PCT > 2 ng/mL:     Systemic infection (sepsis) is likely,     unless other causes are known.     (NOTE)             ICU PCT Algorithm               Non ICU PCT Algorithm        ----------------------------     ------------------------------             PCT < 0.25 ng/mL                 PCT < 0.1 ng/mL         Stopping of antibiotics            Stopping of antibiotics           strongly encouraged.               strongly encouraged.        ----------------------------     ------------------------------  PCT level decrease by               PCT < 0.25 ng/mL           >= 80% from peak PCT           OR PCT 0.25 - 0.5 ng/mL          Stopping of antibiotics                                                 encouraged.         Stopping of antibiotics               encouraged.        ----------------------------     ------------------------------           PCT level decrease by              PCT >= 0.25 ng/mL           < 80% from peak PCT            AND PCT >= 0.5 ng/mL            Continuing antibiotics                                                  encouraged.           Continuing antibiotics                encouraged.        ----------------------------     ------------------------------         PCT level increase compared          PCT > 0.5 ng/mL             with peak PCT AND              PCT >= 0.5 ng/mL             Escalation of antibiotics                                              strongly encouraged.           Escalation of antibiotics            strongly encouraged.  CULTURE, BLOOD (ROUTINE X 2)     Status: None   Collection Time    05/10/12  1:45 PM      Result Value Range   Specimen Description BLOOD RIGHT ARM     Special Requests BOTTLES DRAWN AEROBIC AND ANAEROBIC 7CC     Culture  Setup Time 05/10/2012 17:09     Culture       Value:        BLOOD CULTURE RECEIVED NO GROWTH TO DATE CULTURE WILL BE HELD FOR 5 DAYS BEFORE ISSUING A FINAL NEGATIVE REPORT   Report Status PENDING    LACTIC ACID, PLASMA     Status: None   Collection Time    05/10/12  1:45 PM      Result Value Range  Lactic Acid, Venous 1.4  0.5 - 2.2 mmol/L  POCT I-STAT TROPONIN I     Status: None   Collection Time    05/10/12  1:59 PM      Result Value Range   Troponin i, poc 0.00  0.00 - 0.08 ng/mL   Comment 3            Comment: Due to the release kinetics of cTnI,     a negative result within the first hours     of the onset of symptoms does not rule out     myocardial infarction with certainty.     If myocardial infarction is still suspected,     repeat the test at appropriate intervals.  TYPE AND SCREEN     Status: None   Collection Time    05/10/12  2:10 PM      Result Value Range   ABO/RH(D) O POS     Antibody Screen NEG     Sample Expiration 05/13/2012     Unit Number [id number]     Blood Component Type RED CELLS,LR     Unit division 00     Status of Unit ISSUED,FINAL     Transfusion Status OK TO TRANSFUSE     Crossmatch Result Compatible     Unit Number [id number]     Blood Component Type RED CELLS,LR     Unit division 00     Status of Unit ISSUED     Transfusion Status OK TO TRANSFUSE     Crossmatch Result Compatible    ABO/RH     Status: None   Collection Time    05/10/12  2:10 PM      Result Value Range   ABO/RH(D) O POS    PREPARE RBC (CROSSMATCH)     Status: None   Collection Time    05/10/12  2:30 PM      Result Value Range   Order Confirmation ORDER PROCESSED BY BLOOD BANK     OCCULT BLOOD, POC DEVICE     Status: Abnormal   Collection Time    05/10/12  3:19 PM      Result Value Range   Fecal Occult Bld POSITIVE (*) NEGATIVE  GLUCOSE, CAPILLARY     Status: Abnormal   Collection Time    05/11/12 12:32 AM      Result Value Range   Glucose-Capillary 189 (*) 70 - 99 mg/dL   Comment 1 Documented in Chart     Comment 2 Notify RN    TROPONIN I     Status: None   Collection Time    05/11/12 12:45 AM      Result Value Range   Troponin I <0.30  <0.30 ng/mL   Comment:            Due to the release kinetics of cTnI,     a negative result within the first hours     of the onset of symptoms does not rule out     myocardial infarction with certainty.     If myocardial infarction is still suspected,     repeat the test at appropriate intervals.  GLUCOSE, CAPILLARY     Status: Abnormal   Collection Time    05/11/12  7:48 AM      Result Value Range   Glucose-Capillary 214 (*) 70 - 99 mg/dL  COMPREHENSIVE METABOLIC PANEL     Status: Abnormal   Collection Time    05/11/12  9:30  AM      Result Value Range   Sodium 135  135 - 145 mEq/L   Potassium 4.2  3.5 - 5.1 mEq/L   Chloride 99  96 - 112 mEq/L   CO2 24  19 - 32 mEq/L   Glucose, Bld 219 (*) 70 - 99 mg/dL   BUN 27 (*) 6 - 23 mg/dL   Creatinine, Ser 1.61  0.50 - 1.10 mg/dL   Calcium 8.6  8.4 - 09.6 mg/dL   Total Protein 6.7  6.0 - 8.3 g/dL   Albumin 2.2 (*) 3.5 - 5.2 g/dL   AST 20  0 - 37 U/L   ALT 16  0 - 35 U/L   Alkaline Phosphatase 57  39 - 117 U/L   Total Bilirubin 0.2 (*) 0.3 - 1.2 mg/dL   GFR calc non Af Amer 89 (*) >90 mL/min   GFR calc Af Amer >90  >90 mL/min   Comment:            The eGFR has been calculated     using the CKD EPI equation.     This calculation has not been     validated in all clinical     situations.     eGFR's persistently     <90 mL/min signify     possible Chronic Kidney Disease.  CBC     Status: Abnormal   Collection Time    05/11/12  9:30 AM      Result Value Range    WBC 7.1  4.0 - 10.5 K/uL   RBC 3.29 (*) 3.87 - 5.11 MIL/uL   Hemoglobin 9.4 (*) 12.0 - 15.0 g/dL   Comment: REPEATED TO VERIFY     DELTA CHECK NOTED     POST TRANSFUSION SPECIMEN   HCT 28.7 (*) 36.0 - 46.0 %   MCV 87.2  78.0 - 100.0 fL   MCH 28.6  26.0 - 34.0 pg   MCHC 32.8  30.0 - 36.0 g/dL   RDW 04.5 (*) 40.9 - 81.1 %   Platelets 322  150 - 400 K/uL   Comment: SPECIMEN CHECKED FOR CLOTS     REPEATED TO VERIFY     DELTA CHECK NOTED  PHENYTOIN LEVEL, TOTAL     Status: Abnormal   Collection Time    05/11/12  9:30 AM      Result Value Range   Phenytoin Lvl <2.5 (*) 10.0 - 20.0 ug/mL  PROTIME-INR     Status: Abnormal   Collection Time    05/11/12  9:30 AM      Result Value Range   Prothrombin Time 16.1 (*) 11.6 - 15.2 seconds   INR 1.32  0.00 - 1.49  APTT     Status: None   Collection Time    05/11/12  9:30 AM      Result Value Range   aPTT 33  24 - 37 seconds  TROPONIN I     Status: None   Collection Time    05/11/12  9:30 AM      Result Value Range   Troponin I <0.30  <0.30 ng/mL   Comment:            Due to the release kinetics of cTnI,     a negative result within the first hours     of the onset of symptoms does not rule out     myocardial infarction with certainty.     If myocardial infarction is still  suspected,     repeat the test at appropriate intervals.  GLUCOSE, CAPILLARY     Status: Abnormal   Collection Time    05/11/12 12:11 PM      Result Value Range   Glucose-Capillary 207 (*) 70 - 99 mg/dL    Dg Chest 2 View  0/10/8117  *RADIOLOGY REPORT*  Clinical Data: Fever.  Renal cancer  CHEST - 2 VIEW  Comparison: 04/28/2012  Findings: Improvement in bibasilar airspace disease compared with the prior study.  There remains elevation of the right hemidiaphragm, and mild atelectasis in the lung bases.  Small left effusion is improved.  There is no heart failure or edema.  IMPRESSION: Improvement in bibasilar airspace disease and small left effusion. Negative for  heart failure.   Original Report Authenticated By: Janeece Riggers, M.D.    Ct Chest Wo Contrast  05/11/2012  *RADIOLOGY REPORT*  Clinical Data: Basilar infiltrates.  Aspiration pneumonia.  Altered mental status.  CT CHEST WITHOUT CONTRAST  Technique:  Multidetector CT imaging of the chest was performed following the standard protocol without IV contrast.  Comparison: None.  Findings: Right-sided central venous catheter with tip in the superior vena cava.  Mild cardiac enlargement with small pericardial effusion.  Normal caliber thoracic aorta.  Mediastinal lymph nodes are present without pathologic enlargement.  Small right pleural effusion with atelectasis or infiltration in the right lung base.  Mild atelectasis or infiltration in the left lung base.  Changes are nonspecific but would be compatible with aspiration in the appropriate clinical setting.  Airways are patent.  No pneumothorax.  Degenerative changes in the thoracic spine. Nonspecific area of low attenuation in the medial segment left lobe of the liver which could represent focal fatty infiltration.  Similar appearance as demonstrated on previous CT abdomen and pelvis from 03/03/2012.  IMPRESSION: Right pleural effusion with bilateral basilar atelectasis or infiltration.  Small pericardial effusion.   Original Report Authenticated By: Burman Nieves, M.D.    Dg Chest Port 1 View  05/10/2012  *RADIOLOGY REPORT*  Clinical Data: Line placement  PORTABLE CHEST - 1 VIEW  Comparison: 1444 hours  Findings: Right upper extremity PICC tip in the mid SVC.  Right pleural effusion worse.  Left lung is clear.  Normal heart size. No pneumothorax. Left pleural effusion improved.  Left basilar atelectasis improved.  IMPRESSION: Right PICC tip at the SVC.  Worsening right effusion.  Left pleural effusion and basilar atelectasis improved.   Original Report Authenticated By: Jolaine Click, M.D.     ROS negative except above Blood pressure 127/62, pulse 92, temperature  99.6 F (37.6 C), temperature source Oral, resp. rate 16, height 5\' 3"  (1.6 m), weight 70.4 kg (155 lb 3.3 oz), SpO2 97.00%. Physical Exam vital signs stable low-grade abdomen is soft nontender PEG site looks good hospital computer reviewed hemoglobin increased nicely BUN minimally elevated over baseline ssessment/Plan: Slight worsening anemia in patient with episodic guaiac positivity and multiple medical problem Plan: Will allow clear liquids if okay with husband or swallowing team and will have partner this weekend discuss further workup and plans with husband to see if a colonoscopy on Monday as needed or wanted or we could continue to follow her clinically  Kaydense Rizo E 05/11/2012, 12:24 PM

## 2012-05-12 DIAGNOSIS — D649 Anemia, unspecified: Secondary | ICD-10-CM

## 2012-05-12 DIAGNOSIS — G9340 Encephalopathy, unspecified: Secondary | ICD-10-CM

## 2012-05-12 DIAGNOSIS — J690 Pneumonitis due to inhalation of food and vomit: Principal | ICD-10-CM

## 2012-05-12 DIAGNOSIS — E43 Unspecified severe protein-calorie malnutrition: Secondary | ICD-10-CM

## 2012-05-12 LAB — BASIC METABOLIC PANEL
BUN: 22 mg/dL (ref 6–23)
Calcium: 8.6 mg/dL (ref 8.4–10.5)
Creatinine, Ser: 0.61 mg/dL (ref 0.50–1.10)
GFR calc Af Amer: >90 mL/min (ref >90)
GFR calc non Af Amer: >90 mL/min (ref >90)

## 2012-05-12 LAB — URINE CULTURE: Colony Count: 5000

## 2012-05-12 LAB — TYPE AND SCREEN

## 2012-05-12 LAB — CBC
HCT: 28.6 % — ABNORMAL LOW (ref 36.0–46.0)
MCHC: 32.5 g/dL (ref 30.0–36.0)
MCV: 87.2 fL (ref 78.0–100.0)
Platelets: 298 10*3/uL (ref 150–400)
RDW: 15.9 % — ABNORMAL HIGH (ref 11.5–15.5)

## 2012-05-12 LAB — GLUCOSE, CAPILLARY: Glucose-Capillary: 190 mg/dL — ABNORMAL HIGH (ref 70–99)

## 2012-05-12 MED ORDER — INSULIN DETEMIR 100 UNIT/ML ~~LOC~~ SOLN
40.0000 [IU] | Freq: Two times a day (BID) | SUBCUTANEOUS | Status: DC
  Administered 2012-05-12 – 2012-05-16 (×8): 40 [IU] via SUBCUTANEOUS
  Filled 2012-05-12 (×10): qty 0.4

## 2012-05-12 NOTE — Progress Notes (Addendum)
[facility] PROGRESS NOTE  [name] BMW:[id number] DOB: 1940-10-04 DOA: 05/10/2012 PCP: Johny Blamer, MD  Brief narrative 72 year old female patient with history of CP, possible MR, parkinsonism, DM 2, renal cancer, dysphagia with recurrent aspiration pneumonia, bladder spasms, multiple recent hospitalizations, presented on 05/10/2012 with altered mental status and fever of 101.6F. She was recently treated for UTI and HCAP. At baseline patient apparently is bed bound and requires full assist for all activities of daily living. In the emergency department, hemoglobin was 5.8 and she was transfused 2 units of PRBCs. Empiric broad-spectrum IV antibiotics-vancomycin and cefepime were started.  Assessment/Plan: 1. Acute encephalopathy: Likely secondary to acute medical illness (fever, anemia, dehydration, AKI) complicating underlying CP/MR. patient seen sleeping this morning. Per nursing, no acute events. 2. Anemia/FOBT +: Unclear etiology. Hemoccult-positive in ED. No overt source of bleeding otherwise. GI consultation appreciated and they will discuss with family regarding need for further workup pending palliative care meeting for goals of care. Improved after 2 units PRBC. Stable 3. Fever and leukocytosis/recurrent aspiration pneumonia: Unclear etiology. Continue empiric IV vancomycin and Zosyn pending culture results. CT chest - R>L pleural effusion, basilar Atx Vs infiltrate. ? Another episode of aspiration pneumonia. If continues to spike fevers despite antibiotics, may consider diagnostic right thoracentesis/? Parapneumonic effusion. Per report, patient continues to be fed by mouth at home despite high risks for aspiration. 4. Hypertension: Controlled. Blood pressures were soft in ED. Hydralazine held. Continue low-dose metoprolol. 5. Type 2 diabetes mellitus: Uncontrolled. Resumed Levemir and continue SSI-we'll increase Levemir to home dose. 6. Chest discomfort: Resolved. Troponins  negative. Leukocytosis resolved. 7. ? Urinary Retention: Foley's catheter was removed on 4/4 and overnight had difficulty voiding and had in & out cath done. Monitor urine output during day today.  Code Status: Full Family Communication: No family at bedside. Disposition Plan: Home when medically stable.   Consultants:  Gastroenterology  Palliative care team  Procedures:  None  Antibiotics:  IV vancomycin 4/3 >  IV Zosyn 4/3 >   HPI/Subjective: Patient sleeping this morning. Per nursing, no acute events.  Objective: Filed Vitals:   05/11/12 2110 05/11/12 2117 05/12/12 0456 05/12/12 1039  BP: 150/62 136/66 113/42 132/50  Pulse: 92  85 88  Temp: 100.8 F (38.2 C)  98.5 F (36.9 C)   TempSrc: Oral  Oral   Resp: 18  18   Height:      Weight:      SpO2: 97% 97% 96%     Intake/Output Summary (Last 24 hours) at 05/12/12 1404 Last data filed at 05/12/12 0918  Gross per 24 hour  Intake 1684.8 ml  Output   1975 ml  Net -290.2 ml   Filed Weights   05/10/12 1316 05/10/12 1755  Weight: 68.8 kg (151 lb 10.8 oz) 70.4 kg (155 lb 3.3 oz)    Exam:   General exam: Comfortable.  Respiratory system: Reduced breath sounds in the bases but otherwise clear to auscultation. No increased work of breathing.  Cardiovascular system: S1 & S2 heard, RRR. No JVD, murmurs, gallops, clicks or pedal edema.  Gastrointestinal system: Abdomen is nondistended, soft and nontender. Normal bowel sounds heard. PEG tube intact.  Central nervous system: Sleeping. No focal neurological deficits.  Ears: Tiny approximately 0.5 cm dark spot on right pinna. No other acute findings.   Data Reviewed: Basic Metabolic Panel:  Recent Labs Lab 05/10/12 1330 05/11/12 0930 05/12/12 0515  NA 130* 135 135  K 4.7 4.2 3.8  CL 92* 99 101  CO2 26 24 24   GLUCOSE 165* 219* 202*  BUN 34* 27* 22  CREATININE 0.83 0.64 0.61  CALCIUM 10.1 8.6 8.6   Liver Function Tests:  Recent Labs Lab  05/10/12 1330 05/11/12 0930  AST 23 20  ALT 16 16  ALKPHOS 70 57  BILITOT 0.2* 0.2*  PROT 8.1 6.7  ALBUMIN 2.6* 2.2*   No results found for this basename: LIPASE, AMYLASE,  in the last 168 hours No results found for this basename: AMMONIA,  in the last 168 hours CBC:  Recent Labs Lab 05/10/12 1330 05/11/12 0930 05/12/12 0515  WBC 18.6* 7.1 4.8  NEUTROABS 14.3*  --   --   HGB 5.8* 9.4* 9.3*  HCT 18.1* 28.7* 28.6*  MCV 89.6 87.2 87.2  PLT 565* 322 298   Cardiac Enzymes:  Recent Labs Lab 05/11/12 0045 05/11/12 0930  TROPONINI <0.30 <0.30   BNP (last 3 results) No results found for this basename: PROBNP,  in the last 8760 hours CBG:  Recent Labs Lab 05/11/12 1211 05/11/12 1706 05/11/12 2239 05/12/12 0850 05/12/12 1153  GLUCAP 207* 230* 221* 190* 256*    Recent Results (from the past 240 hour(s))  CULTURE, BLOOD (ROUTINE X 2)     Status: None   Collection Time    05/10/12  1:30 PM      Result Value Range Status   Specimen Description BLOOD RIGHT ARM   Final   Special Requests BOTTLES DRAWN AEROBIC AND ANAEROBIC 4CC   Final   Culture  Setup Time 05/10/2012 17:09   Final   Culture     Final   Value:        BLOOD CULTURE RECEIVED NO GROWTH TO DATE CULTURE WILL BE HELD FOR 5 DAYS BEFORE ISSUING A FINAL NEGATIVE REPORT   Report Status PENDING   Incomplete  CULTURE, BLOOD (ROUTINE X 2)     Status: None   Collection Time    05/10/12  1:45 PM      Result Value Range Status   Specimen Description BLOOD RIGHT ARM   Final   Special Requests BOTTLES DRAWN AEROBIC AND ANAEROBIC [facility]   Final   Culture  Setup Time 05/10/2012 17:09   Final   Culture     Final   Value:        BLOOD CULTURE RECEIVED NO GROWTH TO DATE CULTURE WILL BE HELD FOR 5 DAYS BEFORE ISSUING A FINAL NEGATIVE REPORT   Report Status PENDING   Incomplete     Studies: Dg Chest 2 View  05/10/2012  *RADIOLOGY REPORT*  Clinical Data: Fever.  Renal cancer  CHEST - 2 VIEW  Comparison: 04/28/2012  Findings:  Improvement in bibasilar airspace disease compared with the prior study.  There remains elevation of the right hemidiaphragm, and mild atelectasis in the lung bases.  Small left effusion is improved.  There is no heart failure or edema.  IMPRESSION: Improvement in bibasilar airspace disease and small left effusion. Negative for heart failure.   Original Report Authenticated By: Janeece Riggers, M.D.    Ct Chest Wo Contrast  05/11/2012  *RADIOLOGY REPORT*  Clinical Data: Basilar infiltrates.  Aspiration pneumonia.  Altered mental status.  CT CHEST WITHOUT CONTRAST  Technique:  Multidetector CT imaging of the chest was performed following the standard protocol without IV contrast.  Comparison: None.  Findings: Right-sided central venous catheter with tip in the superior vena cava.  Mild cardiac enlargement with small pericardial effusion.  Normal caliber thoracic aorta.  Mediastinal  lymph nodes are present without pathologic enlargement.  Small right pleural effusion with atelectasis or infiltration in the right lung base.  Mild atelectasis or infiltration in the left lung base.  Changes are nonspecific but would be compatible with aspiration in the appropriate clinical setting.  Airways are patent.  No pneumothorax.  Degenerative changes in the thoracic spine. Nonspecific area of low attenuation in the medial segment left lobe of the liver which could represent focal fatty infiltration.  Similar appearance as demonstrated on previous CT abdomen and pelvis from 03/03/2012.  IMPRESSION: Right pleural effusion with bilateral basilar atelectasis or infiltration.  Small pericardial effusion.   Original Report Authenticated By: Burman Nieves, M.D.    Dg Chest Port 1 View  05/10/2012  *RADIOLOGY REPORT*  Clinical Data: Line placement  PORTABLE CHEST - 1 VIEW  Comparison: 1444 hours  Findings: Right upper extremity PICC tip in the mid SVC.  Right pleural effusion worse.  Left lung is clear.  Normal heart size. No  pneumothorax. Left pleural effusion improved.  Left basilar atelectasis improved.  IMPRESSION: Right PICC tip at the SVC.  Worsening right effusion.  Left pleural effusion and basilar atelectasis improved.   Original Report Authenticated By: Jolaine Click, M.D.      Additional labs:   Scheduled Meds: . antiseptic oral rinse  15 mL Mouth Rinse BID  . famotidine  40 mg Per Tube Daily  . feeding supplement (GLUCERNA 1.2 CAL)  237 mL Per Tube 6 X Daily  . fenofibrate  160 mg Per Tube Daily  . free water  200 mL Per Tube Q6H  . insulin aspart  0-15 Units Subcutaneous TID WC  . insulin aspart  0-5 Units Subcutaneous QHS  . insulin detemir  25 Units Subcutaneous BID  . levETIRAcetam  1,000 mg Per Tube BID  . metoprolol tartrate  12.5 mg Oral BID  . multivitamin with minerals  1 tablet Oral q morning - 10a  . pantoprazole (PROTONIX) IV  40 mg Intravenous Once  . PARoxetine  40 mg Per Tube q morning - 10a  . phenytoin  200 mg Per Tube BID  . piperacillin-tazobactam (ZOSYN)  IV  3.375 g Intravenous Q8H  . pregabalin  150 mg Oral BID  . sodium chloride  3 mL Intravenous Q12H  . vancomycin  500 mg Intravenous Q12H   Continuous Infusions: . sodium chloride 1,000 mL (05/11/12 1730)    Active Problems:   Fever   Diabetes mellitus   Normocytic anemia   Protein-calorie malnutrition, severe   HTN (hypertension)   Acute encephalopathy   Hyponatremia   Acute blood loss anemia    Time spent: 30 minutes    [facility]  [facility] Pager [phone].   If 8PM-8AM, please contact night-coverage at [email or web address], password [facility] 05/12/2012, 2:04 PM  LOS: 2 days

## 2012-05-12 NOTE — Consult Note (Signed)
Patient [name]      DOB: 1941/01/10      UVO:[id number]     Consult Note from the Palliative Medicine Team at [facility]    Consult Requested by: [name]     PCP: Joann Blamer, MD Reason for Consultation: GOC    Phone Number:[phone]  Assessment of patients Current state: 72 yr old white female with a known history of cerebral palsy, renal cancer, amputation of left arm.  She presented with altered mental status and was found to have fever and likely aspiration pneumonia. She has been using a feeding tube for some time , but also eats by mouth . Her Course was complicated by finding hem positive stool and anemia.   [name] in general has the capacity to make her  Own choices, but does have moments of confusion.   Her spouse [name] is very devoted and in general wants what is best for her but also see how frail she is and knows that at some time they will be faced with hard choices.  [name] stated that she knows she is going to die but she loves [name] and wants to live as long as she can.  Overall , they are looking for continue curative care with the knowledge that these resources are available if  [name] continues to do poorly.    Goals of Care: 1.  Code Status: change code status to DNR   2. Scope of Treatment: family would like to move forward with curative care. Treating aspiration p eumonia and investigating hem positive stool.  Patient and spouse desire to continue tube feeding and desire to try to eat as much by mouth for pleasure and comfort   4. Disposition: plan to return home with her in place support.  Hospice availabe if and when family at that time in their lives   3. Symptom Management:   1. Anxiety/Agitation: continue klonipin 2. Pain: continue norc 3. Seizure continue current home meds 4. Hem positive stool: investigate and rediscuss goals once out come known 5.   4. Psychosocial: [name] has had a very sharp mind and wit over the years she has  intermittent cognitive incapacity but for the most part can make her wishes known .  Her spouse appears to have her best interests in mind.  5. Spiritual: [name] is [name] and appreciates that support       Patient Documents Completed or Given: Document Given Completed  Advanced Directives Pkt    MOST    DNR    Gone from My Sight    Hard Choices      Brief HPI:  Patient admitted with altered mental status found to have hem positive stool and aspiration pneumonia   ROS: cough, no shob, no current pain. No nausea or diarrhea    PMH:  Past Medical History  Diagnosis Date  . Diabetes mellitus   . Renal disorder   . CP (cerebral palsy)   . Parkinsonism   . Dysphagia   . Recurrent aspiration pneumonia   . Bladder spasms   . Amputation of arm   . Recurrent UTI   . Renal cancer     s/p nephrectomy     PSH: Past Surgical History  Procedure Laterality Date  . 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 Peg tube placement    . Knee surgery    . Nephrectomy     I have reviewed the FH  and SH and  If appropriate update it with new information. Allergies  Allergen Reactions  . Codeine Nausea And Vomiting  . Morphine And Related Nausea And Vomiting   Scheduled Meds: . antiseptic oral rinse  15 mL Mouth Rinse BID  . famotidine  40 mg Per Tube Daily  . feeding supplement (GLUCERNA 1.2 CAL)  237 mL Per Tube 6 X Daily  . fenofibrate  160 mg Per Tube Daily  . free water  200 mL Per Tube Q6H  . insulin aspart  0-15 Units Subcutaneous TID WC  . insulin aspart  0-5 Units Subcutaneous QHS  . insulin detemir  40 Units Subcutaneous BID  . levETIRAcetam  1,000 mg Per Tube BID  . metoprolol tartrate  12.5 mg Oral BID  . multivitamin with minerals  1 tablet Oral q morning - 10a  . pantoprazole (PROTONIX) IV  40 mg Intravenous Once  . PARoxetine  40 mg Per Tube q morning - 10a  . phenytoin  200 mg Per Tube BID  . piperacillin-tazobactam (ZOSYN)  IV  3.375 g  Intravenous Q8H  . pregabalin  150 mg Oral BID  . sodium chloride  3 mL Intravenous Q12H  . vancomycin  500 mg Intravenous Q12H   Continuous Infusions: . sodium chloride 1,000 mL (05/11/12 1730)   PRN Meds:.acetaminophen, acetaminophen, clonazePAM, HYDROcodone-acetaminophen, nystatin, ondansetron (ZOFRAN) IV, ondansetron, sodium chloride    BP 133/52  Pulse 88  Temp(Src) 98.1 F (36.7 C) (Oral)  Resp 20  Ht 5\' 3"  (1.6 m)  Wt 70.4 kg (155 lb 3.3 oz)  BMI 27.5 kg/m2  SpO2 97%   PPS: 30%   Intake/Output Summary (Last 24 hours) at 05/12/12 1924 Last data filed at 05/12/12 1732  Gross per 24 hour  Intake   1890 ml  Output    800 ml  Net   1090 ml     Physical Exam:  General: no acute distress, weak head drooping to right, mild drooling HEENT:  PERRL, EOMI, mmm Chest:   Decreased, some basilar rhonchi CVS: regular , rate and rhythm Abdomen: obese soft not tender positive bowel sounds Ext: trace edema, spastic paresis lower extremities Neuro:oriented , to person, place, and spouse  Labs: CBC    Component Value Date/Time   WBC 4.8 05/12/2012 0515   RBC 3.28* 05/12/2012 0515   HGB 9.3* 05/12/2012 0515   HCT 28.6* 05/12/2012 0515   PLT 298 05/12/2012 0515   MCV 87.2 05/12/2012 0515   MCH 28.4 05/12/2012 0515   MCHC 32.5 05/12/2012 0515   RDW 15.9* 05/12/2012 0515   LYMPHSABS 2.8 05/10/2012 1330   MONOABS 1.5* 05/10/2012 1330   EOSABS 0.0 05/10/2012 1330   BASOSABS 0.0 05/10/2012 1330     CMP     Component Value Date/Time   NA 135 05/12/2012 0515   K 3.8 05/12/2012 0515   CL 101 05/12/2012 0515   CO2 24 05/12/2012 0515   GLUCOSE 202* 05/12/2012 0515   BUN 22 05/12/2012 0515   CREATININE 0.61 05/12/2012 0515   CALCIUM 8.6 05/12/2012 0515   PROT 6.7 05/11/2012 0930   ALBUMIN 2.2* 05/11/2012 0930   AST 20 05/11/2012 0930   ALT 16 05/11/2012 0930   ALKPHOS 57 05/11/2012 0930   BILITOT 0.2* 05/11/2012 0930   GFRNONAA >90 05/12/2012 0515   GFRAA >90 05/12/2012 0515    Chest Xray Reviewed/Impressions:  worsen right effusion, left effusion     Time In Time Out Total Time Spent with Patient Total  Overall Time  600 pm 715 pm 75 min    Greater than 50%  of this time was spent counseling and coordinating care related to the above assessment and plan.    Joann Rothermel L. Ladona Ridgel, MD MBA The Palliative Medicine Team at [facility] Phone: [phone] Pager: [phone]

## 2012-05-12 NOTE — Progress Notes (Signed)
Patient [id number] [name]      DOB: Mar 20, 1940      AOZ:[id number]   Goals of Care summary; full note to follow;  Met with patient and her spouse [name] and Lib understand how many things that she has against her .  He understands that she has recurrent aspiration potential , and now the GI issues, and all her chronic diseases.  Lib was able to to give excellent input into her goals.  She stated being in the hospital is "for the birds".  She is not afraid to die as she has a deep faith in God, but she doesn't want to leave [facility] before she has to since she "really loves him".  Together they decided that a DNR would be very appropriate.  They would like to try to discover why she is bleeding and so desire to have the colonoscopy if necessary.  They would like to try to continue to treat infections and do tube feeding for now and take things a day at a time.    We openly talked about using hospice in the future because when I asked Lib how she would want the last time in her life to be when the Lord calls her home she stated "I want it to be "easy".  They know that we could have them help with end of life care at anytime that she decides that she doesn't want to come back and forth to the hospital.    Total time 6 pm -715 pm  PPS 20 %  Ori Trejos L. Ladona Ridgel, MD MBA The Palliative Medicine Team at [facility] Phone: [phone] Pager: [phone]

## 2012-05-12 NOTE — Progress Notes (Signed)
Subjective: No reported blood in stool. No voiced complaints  Objective: Vital signs in last 24 hours: Temp:  [98.5 F (36.9 C)-100.8 F (38.2 C)] 98.5 F (36.9 C) (04/05 0456) Pulse Rate:  [85-92] 88 (04/05 1039) Resp:  [18] 18 (04/05 0456) BP: (113-150)/(42-66) 132/50 mmHg (04/05 1039) SpO2:  [95 %-97 %] 96 % (04/05 0456) Weight change:  Last BM Date: 05/11/12  PE: GEN:  NAD ABD:  Soft  Lab Results: CBC    Component Value Date/Time   WBC 4.8 05/12/2012 0515   RBC 3.28* 05/12/2012 0515   HGB 9.3* 05/12/2012 0515   HCT 28.6* 05/12/2012 0515   PLT 298 05/12/2012 0515   MCV 87.2 05/12/2012 0515   MCH 28.4 05/12/2012 0515   MCHC 32.5 05/12/2012 0515   RDW 15.9* 05/12/2012 0515   LYMPHSABS 2.8 05/10/2012 1330   MONOABS 1.5* 05/10/2012 1330   EOSABS 0.0 05/10/2012 1330   BASOSABS 0.0 05/10/2012 1330   Assessment:  1.  Guaiac positive stool.  No evidence of overt bleeding. 2.  Normocytic anemia. 3.  Parkinson's. 4.  Recurrent aspiration pneumonia. 5.  Multiple medical problems.  Plan:  1.  Family meeting with palliative care planned for later this afternoon, to help address goals of care. 2.  Pending outcome of this meeting, we might consider colonoscopy for further evaluation of patient's hemoccult-positive stool with anemia.  If colonoscopy is ultimately pursued, she would have to consume prep via her PEG tube.  We would likely administer a two-day prep, as I suspect with her immobility and Parkinson's disease it will be hard to have complete bowel emptying with only one day's worth of prep. 3.  Will follow.   Freddy Jaksch 05/12/2012, 12:03 PM

## 2012-05-12 NOTE — Progress Notes (Signed)
Patient retained urine during the night. Bladder scan showed >669mls. Obtained order for I&O catheter by T. Claiborne Billings. Patient voided 800 mls. She is resting and comfortable now.

## 2012-05-12 NOTE — Care Management (Signed)
Cm followed up with RN concerning MD & pharmacy's inability to contact pt's husband on 05/11/12. Per RN pt's spouse came to hospital during the evening. Spouse states at work during day, unable to carry cell phone during work hours. Per RN MD able to communicate with spouse during his visit with patient. Pharmacy able to also complete med rec. Will continue to follow for any further needs.  Leonie Green [phone]

## 2012-05-12 NOTE — Progress Notes (Signed)
SLP Cancellation Note  Patient Details Name: [name] MRN: [id number] DOB: 1940/07/12   Cancelled treatment:        Pt for palliative meeting this pm per notes.  SLP to return at later date for family education to mitigate aspiration risk.     Appreciate RN, Murrell Redden, educating spouse to importance of following aspiration precautions and indication for staff to feed pt currently per SLP conversation earlier today with RN and review of note from last pm.    Pt known to SLP from previous swallow evaluation in 2011, at which time spouse had been educated to findings, recommendations.   Donavan Burnet, MS [facility] SLP [phone]    Chales Abrahams 05/12/2012, 3:02 PM

## 2012-05-12 NOTE — Progress Notes (Signed)
Thank you for consulting the Palliative Medicine Team at [facility] to meet your patient's and family's needs.   The reason that you asked [place] to see your patient is  For GOC  We have scheduled your patient for a meeting: 05/12/2012 6 pm  The Surrogate decision make is: spouse Leotis Pain information:[phone]  Other family members that need to be present: NA    Your patient is able/unable to participate: likely not  Additional Narrative:

## 2012-05-12 NOTE — Progress Notes (Signed)
Upon entering  room for bedside report, found pt husband feeding  pt a clear liquid tray. Pt was sitting at a 25-30 degree angle and pt had already consumed her Jello, [place] icy, and half of her chicken broth. (I had educated the husband an hour earlier that a staff member needed to feed her and that she needed to sit at a 60-80 degree angle while eating and 30 minutes after eating. Pt husband verbalized understanding and said " I'll call you when her tray arrives.") I again reeducated pt husband that she needed to be sitting up right at a 60-80 degree angle while eating and he stated "well look at her head". I tried to educated him on the importance of the pt sitting up right at a 60-80 degree angle to eat and the importance of only staff member feeding her due to her recurrent aspiration pneumonia.pt husband complied with request and stopped feeding pt.

## 2012-05-13 DIAGNOSIS — F341 Dysthymic disorder: Secondary | ICD-10-CM

## 2012-05-13 DIAGNOSIS — Z515 Encounter for palliative care: Secondary | ICD-10-CM

## 2012-05-13 DIAGNOSIS — K922 Gastrointestinal hemorrhage, unspecified: Secondary | ICD-10-CM

## 2012-05-13 DIAGNOSIS — R569 Unspecified convulsions: Secondary | ICD-10-CM

## 2012-05-13 LAB — GLUCOSE, CAPILLARY
Glucose-Capillary: 177 mg/dL — ABNORMAL HIGH (ref 70–99)
Glucose-Capillary: 160 mg/dL — ABNORMAL HIGH (ref 70–99)
Glucose-Capillary: 162 mg/dL — ABNORMAL HIGH (ref 70–99)

## 2012-05-13 LAB — CBC
HCT: 30.1 % — ABNORMAL LOW (ref 36.0–46.0)
MCV: 88.3 fL (ref 78.0–100.0)
RDW: 15.6 % — ABNORMAL HIGH (ref 11.5–15.5)
WBC: 5.9 10*3/uL (ref 4.0–10.5)

## 2012-05-13 NOTE — Progress Notes (Signed)
Dr. Dulce Sellar states to late to start prep this evening, will follow up on pt tmr.

## 2012-05-13 NOTE — Progress Notes (Signed)
[facility] PROGRESS NOTE  [name]:[id number] DOB: December 10, 1940 DOA: 05/10/2012 PCP: Johny Blamer, MD  Brief narrative 72 year old female patient with history of CP, possible MR, parkinsonism, DM 2, renal cancer, dysphagia with recurrent aspiration pneumonia, bladder spasms, multiple recent hospitalizations, presented on 05/10/2012 with altered mental status and fever of 101.37F. She was recently treated for UTI and HCAP. At baseline patient apparently is bed bound and requires full assist for all activities of daily living. In the emergency department, hemoglobin was 5.8 and she was transfused 2 units of PRBCs. Empiric broad-spectrum IV antibiotics-vancomycin and cefepime were started.  Assessment/Plan: 1. Acute encephalopathy: Likely secondary to acute medical illness (fever, anemia, dehydration, AKI) complicating underlying CP/MR. Patient seems confused this morning. No focal deficits. Monitor. 2. Anemia/FOBT +: Unclear etiology. Hemoccult-positive in ED. No overt source of bleeding otherwise. GI consultation appreciated. Improved after 2 units PRBC. Stable. Patient undecided at this time regarding colonoscopy. 3. Fever and leukocytosis/recurrent aspiration pneumonia: Continue empiric IV vancomycin and Zosyn pending culture results. CT chest - R>L pleural effusion, basilar Atx Vs infiltrate. ? Another episode of aspiration pneumonia. If continues to spike fevers despite antibiotics, may consider diagnostic right thoracentesis/? Parapneumonic effusion. Per report, patient continues to be fed by mouth at home despite high risks for aspiration. 4. Hypertension: Controlled. Blood pressures were soft in ED. Hydralazine held. Continue low-dose metoprolol. 5. Type 2 diabetes mellitus: Uncontrolled. Resumed Levemir and continue SSI-we'll increase Levemir to home dose. Better. 6. Chest discomfort: Resolved. Troponins negative. Leukocytosis resolved. 7. ? Urinary Retention: Foley's catheter  was removed on 4/4 and overnight had difficulty voiding and had in & out cath done. Monitor urine output during day today.  Code Status: Full Family Communication: No family at bedside. Disposition Plan: Home when medically stable.   Consultants:  Gastroenterology  Palliative care team  Procedures:  None  Antibiotics:  IV vancomycin 4/3 >  IV Zosyn 4/3 >   HPI/Subjective: Patient appears confused this morning.  Objective: Filed Vitals:   05/12/12 1500 05/12/12 2130 05/13/12 0606 05/13/12 1309  BP: 133/52 135/48 138/61 151/65  Pulse: 88 92 95 85  Temp: 98.1 F (36.7 C) 98.2 F (36.8 C) 99.1 F (37.3 C) 98.7 F (37.1 C)  TempSrc: Oral Oral Oral Oral  Resp: 20 20 20 20   Height:      Weight:      SpO2: 97% 97% 97% 99%    Intake/Output Summary (Last 24 hours) at 05/13/12 1710 Last data filed at 05/13/12 1300  Gross per 24 hour  Intake    795 ml  Output      0 ml  Net    795 ml   Filed Weights   05/10/12 1316 05/10/12 1755  Weight: 68.8 kg (151 lb 10.8 oz) 70.4 kg (155 lb 3.3 oz)    Exam:   General exam: Comfortable.  Respiratory system: Reduced breath sounds in the bases but otherwise clear to auscultation. No increased work of breathing.  Cardiovascular system: S1 & S2 heard, RRR. No JVD, murmurs, gallops, clicks or pedal edema. Telemetry shows sinus rhythm in the 80s.  Gastrointestinal system: Abdomen is nondistended, soft and nontender. Normal bowel sounds heard. PEG tube intact.  Central nervous system: Alert and oriented only to self. No focal neurological deficits.  Ears: Tiny approximately 0.5 cm dark spot on right pinna. No other acute findings.  Extremities: Left mid forearm amputation. Symmetric 5/5 power in upper extremities. Chronically unable to move lower extremities.   Data Reviewed: Basic  Metabolic Panel:  Recent Labs Lab 05/10/12 1330 05/11/12 0930 05/12/12 0515  NA 130* 135 135  K 4.7 4.2 3.8  CL 92* 99 101  CO2 26 24 24    GLUCOSE 165* 219* 202*  BUN 34* 27* 22  CREATININE 0.83 0.64 0.61  CALCIUM 10.1 8.6 8.6   Liver Function Tests:  Recent Labs Lab 05/10/12 1330 05/11/12 0930  AST 23 20  ALT 16 16  ALKPHOS 70 57  BILITOT 0.2* 0.2*  PROT 8.1 6.7  ALBUMIN 2.6* 2.2*   No results found for this basename: LIPASE, AMYLASE,  in the last 168 hours No results found for this basename: AMMONIA,  in the last 168 hours CBC:  Recent Labs Lab 05/10/12 1330 05/11/12 0930 05/12/12 0515 05/13/12 0445  WBC 18.6* 7.1 4.8 5.9  NEUTROABS 14.3*  --   --   --   HGB 5.8* 9.4* 9.3* 9.7*  HCT 18.1* 28.7* 28.6* 30.1*  MCV 89.6 87.2 87.2 88.3  PLT 565* 322 298 359   Cardiac Enzymes:  Recent Labs Lab 05/11/12 0045 05/11/12 0930  TROPONINI <0.30 <0.30   BNP (last 3 results) No results found for this basename: PROBNP,  in the last 8760 hours CBG:  Recent Labs Lab 05/12/12 1655 05/12/12 1814 05/12/12 2109 05/13/12 0753 05/13/12 1135  GLUCAP 172* 177* 142* 104* 196*    Recent Results (from the past 240 hour(s))  CULTURE, BLOOD (ROUTINE X 2)     Status: None   Collection Time    05/10/12  1:30 PM      Result Value Range Status   Specimen Description BLOOD RIGHT ARM   Final   Special Requests BOTTLES DRAWN AEROBIC AND ANAEROBIC 4CC   Final   Culture  Setup Time 05/10/2012 17:09   Final   Culture     Final   Value:        BLOOD CULTURE RECEIVED NO GROWTH TO DATE CULTURE WILL BE HELD FOR 5 DAYS BEFORE ISSUING A FINAL NEGATIVE REPORT   Report Status PENDING   Incomplete  CULTURE, BLOOD (ROUTINE X 2)     Status: None   Collection Time    05/10/12  1:45 PM      Result Value Range Status   Specimen Description BLOOD RIGHT ARM   Final   Special Requests BOTTLES DRAWN AEROBIC AND ANAEROBIC [facility]   Final   Culture  Setup Time 05/10/2012 17:09   Final   Culture     Final   Value:        BLOOD CULTURE RECEIVED NO GROWTH TO DATE CULTURE WILL BE HELD FOR 5 DAYS BEFORE ISSUING A FINAL NEGATIVE REPORT    Report Status PENDING   Incomplete  URINE CULTURE     Status: None   Collection Time    05/11/12  3:34 PM      Result Value Range Status   Specimen Description URINE, CLEAN CATCH   Final   Special Requests NONE   Final   Culture  Setup Time 05/12/2012 00:33   Final   Colony Count 5,000 COLONIES/ML   Final   Culture INSIGNIFICANT GROWTH   Final   Report Status 05/12/2012 FINAL   Final     Studies: No results found.   Additional labs:   Scheduled Meds: . antiseptic oral rinse  15 mL Mouth Rinse BID  . famotidine  40 mg Per Tube Daily  . feeding supplement (GLUCERNA 1.2 CAL)  237 mL Per Tube 6 X Daily  .  fenofibrate  160 mg Per Tube Daily  . free water  200 mL Per Tube Q6H  . insulin aspart  0-15 Units Subcutaneous TID WC  . insulin aspart  0-5 Units Subcutaneous QHS  . insulin detemir  40 Units Subcutaneous BID  . levETIRAcetam  1,000 mg Per Tube BID  . metoprolol tartrate  12.5 mg Oral BID  . multivitamin with minerals  1 tablet Oral q morning - 10a  . pantoprazole (PROTONIX) IV  40 mg Intravenous Once  . PARoxetine  40 mg Per Tube q morning - 10a  . phenytoin  200 mg Per Tube BID  . piperacillin-tazobactam (ZOSYN)  IV  3.375 g Intravenous Q8H  . pregabalin  150 mg Oral BID  . sodium chloride  3 mL Intravenous Q12H  . vancomycin  500 mg Intravenous Q12H   Continuous Infusions: . sodium chloride 1,000 mL (05/13/12 0517)    Active Problems:   Fever   Diabetes mellitus   Normocytic anemia   Protein-calorie malnutrition, severe   HTN (hypertension)   Acute encephalopathy   Hyponatremia   Acute blood loss anemia   Aspiration pneumonia    Time spent: 30 minutes    [facility]  [facility] Pager [phone].   If 8PM-8AM, please contact night-coverage at [email or web address], password [facility] 05/13/2012, 5:10 PM  LOS: 3 days

## 2012-05-13 NOTE — Progress Notes (Signed)
Patient [id number] [name]      DOB: [birth year]      BTD:[id number]   Palliative Medicine Team at [facility] Progress Note    Subjective: Patient looks better than yesterday.  Brighter affect, speech is strong. Dorene Sorrow not at bedside. [name] says that she will let them do the colonoscopy, but she wants to wait two days.  She relates that she is nervous to have the procedure.    Filed Vitals:   05/13/12 2141  BP: 151/53  Pulse: 91  Temp: 98.8 F (37.1 C)  Resp: 18   Physical exam:   General: no acute distress, stronger today able to hold her head up better Perrl, eomi, anicteric, mmm Chest : coarse rhonchi an the bases , no wheezing Cvs: regular rate and rhythm s1,s2 Abd: obese soft, not tender, peg in place EXt: contractures right upper ext, amputated left hand, lower ext immoble, spastic       Assessment and plan: 72 year old white female with known Cerebral Palsy, admitted with altered mental status found to likely have aspiration pneumonia. Course was complicated by hem positive stool. Plan is to purse some sort of intervention to identify cause for gi tract bleeding.  Patient and spouse concurred with DNR status and we were able to talk about long  term goals which may include hospice in the future.  1. DnR/DNI  2. Spasticity/pain: agree with vicodin as needed.   3.  Seizure and anxiety: contine klonipin and keppra     Total time 15 min  Mette Southgate L. Ladona Ridgel, MD MBA The Palliative Medicine Team at [facility] Phone: [phone] Pager: [phone]

## 2012-05-13 NOTE — Progress Notes (Signed)
Went by to see patient today.  She is not sure whether or not she wants to proceed with colonoscopy. [facility] will revisit tomorrow, at which time hopefully she will have reached a decision.

## 2012-05-13 NOTE — Progress Notes (Signed)
Pt husband present at bedside, states he want colonoscopy done. He states GI can call him tmr for consent if he is not present.

## 2012-05-13 NOTE — Progress Notes (Signed)
Page to Dr. Dulce Sellar to inform that pt's husband wants colonoscopy done. Pt has had a couple of incontient BM's today with no blood noted, stool soft and brown.

## 2012-05-13 NOTE — Progress Notes (Signed)
  Patient CBG 104 at 9:10.  Contacted Dr. Waymon Amato regarding administration of 40 units Levemir at 10:00.  Doctor ordered to give.  Terrence Dupont, student nurse, 05/13/12, 9:42

## 2012-05-14 LAB — GLUCOSE, CAPILLARY
Glucose-Capillary: 219 mg/dL — ABNORMAL HIGH (ref 70–99)
Glucose-Capillary: 170 mg/dL — ABNORMAL HIGH (ref 70–99)
Glucose-Capillary: 175 mg/dL — ABNORMAL HIGH (ref 70–99)

## 2012-05-14 LAB — VANCOMYCIN, TROUGH: Vancomycin Tr: 15.4 ug/mL (ref 10.0–20.0)

## 2012-05-14 MED ORDER — AMOXICILLIN-POT CLAVULANATE 875-125 MG PO TABS
1.0000 | ORAL_TABLET | Freq: Two times a day (BID) | ORAL | Status: DC
  Administered 2012-05-14 – 2012-05-16 (×4): 1
  Filled 2012-05-14 (×5): qty 1

## 2012-05-14 NOTE — Progress Notes (Addendum)
ANTIBIOTIC CONSULT NOTE - FOLLOW UP  Pharmacy Consult for Vancomycin Indication: pneumonia  Allergies  Allergen Reactions  . Codeine Nausea And Vomiting  . Morphine And Related Nausea And Vomiting    Patient Measurements: Height: 5\' 3"  (160 cm) Weight: 155 lb 3.3 oz (70.4 kg) IBW/kg (Calculated) : 52.4  Vital Signs: Temp: 98.2 F (36.8 C) (04/07 0713) Temp src: Oral (04/07 0713) BP: 137/51 mmHg (04/07 0713) Pulse Rate: 91 (04/07 0713)  Labs:  Recent Labs  05/12/12 0515 05/13/12 0445  WBC 4.8 5.9  HGB 9.3* 9.7*  PLT 298 359  CREATININE 0.61  --    Estimated Creatinine Clearance: 62.4 ml/min (by C-G formula based on Cr of 0.61).  Recent Labs  05/14/12 0425  VANCOTROUGH 15.4     Microbiology: 4/4 urine: insignificant growth 4/3 blood x 2: no growth to date  Assessment: 36 yoF bed-ridden with multiple recent admissions for acute encephalopathy, r/o HCAP and UTI. Pt was last discharged 3/28, completed a course of antibiotics (IV cefepime/vancomycin transitioned to PO Levaquin) but returned 4/3 with fever and no improvement in symptoms.  Pt with h/o dysphagia on tube feeds, incr risk of aspiration.   Day #5 vancomycin and Zosyn for presumed recurrent aspiration pneumonia.  Vancomycin trough drawn this AM at goal.  WBC WNL, Afebrile  Goal of Therapy:  Vancomycin trough level 15-20 mcg/ml  Plan:   Continue vancomycin 500 mg IV q12h.  Discussed with MD possibility of switching to oral abx (Augmentin).  Would recommend Augmentin 875 mg PO BID when ready to switch to PO (f/u when since GI considering obtaining EGD and/or colonoscopy).  Clance Boll 05/14/2012,9:54 AM

## 2012-05-14 NOTE — Progress Notes (Signed)
Pt in NAD.  Hgb stable since adm.  In my opinion, the most likely source of an acute signif drop in Hgb w/out visible hematochezia is from the upper tract, which is much easier to examine than the lower tract.  I will therefore suggest to pt's husband that we start w/ an egd only; if that's neg, we can then decide re. Whether it's worth putting pt through the effort and risk of a colonoscopy, which would not likely yield identification of a treatable source of bld loss.  Florencia Reasons, M.D. [phone]

## 2012-05-14 NOTE — Progress Notes (Signed)
[facility] PROGRESS NOTE  [name]:[id number] DOB: May 31, 1940 DOA: 05/10/2012 PCP: Johny Blamer, MD  Brief narrative 72 year old female patient with history of CP, possible MR, parkinsonism, DM 2, renal cancer, dysphagia with recurrent aspiration pneumonia, bladder spasms, multiple recent hospitalizations, presented on 05/10/2012 with altered mental status and fever of 101.27F. She was recently treated for UTI and HCAP. At baseline patient apparently is bed bound and requires full assist for all activities of daily living. In the emergency department, hemoglobin was 5.8 and she was transfused 2 units of PRBCs. Empiric broad-spectrum IV antibiotics-vancomycin and cefepime were started.  Assessment/Plan: 1. Acute encephalopathy: Likely secondary to acute medical illness (fever, anemia, dehydration, AKI) complicating underlying CP/MR. No focal deficits. Monitor. Confusion from yesterday seems to have improved. Answers appropriately today. 2. Anemia/FOBT +: Unclear etiology. Hemoccult-positive in ED. No overt source of bleeding otherwise. GI consultation appreciated. Improved after 2 units PRBC. Stable. GI recommending EGD versus colonoscopy at this time. 3. Fever and leukocytosis/recurrent aspiration pneumonia: Patient was started on IV vancomycin and Zosyn empirically. CT chest - R>L pleural effusion, basilar Atx Vs infiltrate. ? Another episode of aspiration pneumonia. If continues to spike fevers despite antibiotics, may consider diagnostic right thoracentesis/? Parapneumonic effusion. Per report, patient continues to be fed by mouth at home despite high risks for aspiration. Cultures are negative to date. Will change antibiotics to Augmentin via tube. 4. Hypertension: Controlled. Blood pressures were soft in ED. Hydralazine held. Continue low-dose metoprolol. 5. Type 2 diabetes mellitus: Uncontrolled. Resumed Levemir and continue SSI. Improved control. 6. Chest discomfort:  Resolved. Troponins negative. Leukocytosis resolved. 7. ? Urinary Retention: Foley's catheter was removed on 4/4 and overnight had difficulty voiding and had in & out cath done. Seems to have resolved.  Code Status: Full Family Communication: No family at bedside. Disposition Plan: Home when medically stable.   Consultants:  Gastroenterology  Palliative care team  Procedures:  None  Antibiotics:  IV vancomycin 4/3 > 4/7  IV Zosyn 4/3 > 4/7   HPI/Subjective: Patient denies complaints. Denies dyspnea or pain. Wishes to go home.  Objective: Filed Vitals:   05/13/12 1309 05/13/12 2141 05/14/12 0713 05/14/12 1347  BP: 151/65 151/53 137/51 143/49  Pulse: 85 91 91 71  Temp: 98.7 F (37.1 C) 98.8 F (37.1 C) 98.2 F (36.8 C) 98.4 F (36.9 C)  TempSrc: Oral Oral Oral Oral  Resp: 20 18 20 16   Height:      Weight:      SpO2: 99% 97% 98% 97%    Intake/Output Summary (Last 24 hours) at 05/14/12 1430 Last data filed at 05/14/12 1222  Gross per 24 hour  Intake   2889 ml  Output      1 ml  Net   2888 ml   Filed Weights   05/10/12 1316 05/10/12 1755  Weight: 68.8 kg (151 lb 10.8 oz) 70.4 kg (155 lb 3.3 oz)    Exam:   General exam: Comfortable.  Respiratory system: Reduced breath sounds in the bases but otherwise clear to auscultation. No increased work of breathing.  Cardiovascular system: S1 & S2 heard, RRR. No JVD, murmurs, gallops, clicks or pedal edema. Telemetry shows sinus rhythm in the 80s.  Gastrointestinal system: Abdomen is nondistended, soft and nontender. Normal bowel sounds heard. PEG tube intact.  Central nervous system: Alert and oriented to self and place. No focal neurological deficits.  Ears: Tiny approximately 0.5 cm dark spot on right pinna. No other acute findings.  Extremities: Left mid  forearm amputation. Symmetric 5/5 power in upper extremities. Chronically unable to move lower extremities.   Data Reviewed: Basic Metabolic  Panel:  Recent Labs Lab 05/10/12 1330 05/11/12 0930 05/12/12 0515  NA 130* 135 135  K 4.7 4.2 3.8  CL 92* 99 101  CO2 26 24 24   GLUCOSE 165* 219* 202*  BUN 34* 27* 22  CREATININE 0.83 0.64 0.61  CALCIUM 10.1 8.6 8.6   Liver Function Tests:  Recent Labs Lab 05/10/12 1330 05/11/12 0930  AST 23 20  ALT 16 16  ALKPHOS 70 57  BILITOT 0.2* 0.2*  PROT 8.1 6.7  ALBUMIN 2.6* 2.2*   No results found for this basename: LIPASE, AMYLASE,  in the last 168 hours No results found for this basename: AMMONIA,  in the last 168 hours CBC:  Recent Labs Lab 05/10/12 1330 05/11/12 0930 05/12/12 0515 05/13/12 0445  WBC 18.6* 7.1 4.8 5.9  NEUTROABS 14.3*  --   --   --   HGB 5.8* 9.4* 9.3* 9.7*  HCT 18.1* 28.7* 28.6* 30.1*  MCV 89.6 87.2 87.2 88.3  PLT 565* 322 298 359   Cardiac Enzymes:  Recent Labs Lab 05/11/12 0045 05/11/12 0930  TROPONINI <0.30 <0.30   BNP (last 3 results) No results found for this basename: PROBNP,  in the last 8760 hours CBG:  Recent Labs Lab 05/13/12 1135 05/13/12 1708 05/13/12 2150 05/14/12 0753 05/14/12 1207  GLUCAP 196* 160* 162* 182* 219*    Recent Results (from the past 240 hour(s))  CULTURE, BLOOD (ROUTINE X 2)     Status: None   Collection Time    05/10/12  1:30 PM      Result Value Range Status   Specimen Description BLOOD RIGHT ARM   Final   Special Requests BOTTLES DRAWN AEROBIC AND ANAEROBIC 4CC   Final   Culture  Setup Time 05/10/2012 17:09   Final   Culture     Final   Value:        BLOOD CULTURE RECEIVED NO GROWTH TO DATE CULTURE WILL BE HELD FOR 5 DAYS BEFORE ISSUING A FINAL NEGATIVE REPORT   Report Status PENDING   Incomplete  CULTURE, BLOOD (ROUTINE X 2)     Status: None   Collection Time    05/10/12  1:45 PM      Result Value Range Status   Specimen Description BLOOD RIGHT ARM   Final   Special Requests BOTTLES DRAWN AEROBIC AND ANAEROBIC [facility]   Final   Culture  Setup Time 05/10/2012 17:09   Final   Culture     Final    Value:        BLOOD CULTURE RECEIVED NO GROWTH TO DATE CULTURE WILL BE HELD FOR 5 DAYS BEFORE ISSUING A FINAL NEGATIVE REPORT   Report Status PENDING   Incomplete  URINE CULTURE     Status: None   Collection Time    05/11/12  3:34 PM      Result Value Range Status   Specimen Description URINE, CLEAN CATCH   Final   Special Requests NONE   Final   Culture  Setup Time 05/12/2012 00:33   Final   Colony Count 5,000 COLONIES/ML   Final   Culture INSIGNIFICANT GROWTH   Final   Report Status 05/12/2012 FINAL   Final     Studies: No results found.   Additional labs:   Scheduled Meds: . antiseptic oral rinse  15 mL Mouth Rinse BID  . famotidine  40  mg Per Tube Daily  . feeding supplement (GLUCERNA 1.2 CAL)  237 mL Per Tube 6 X Daily  . fenofibrate  160 mg Per Tube Daily  . free water  200 mL Per Tube Q6H  . insulin aspart  0-15 Units Subcutaneous TID WC  . insulin aspart  0-5 Units Subcutaneous QHS  . insulin detemir  40 Units Subcutaneous BID  . levETIRAcetam  1,000 mg Per Tube BID  . metoprolol tartrate  12.5 mg Oral BID  . multivitamin with minerals  1 tablet Oral q morning - 10a  . pantoprazole (PROTONIX) IV  40 mg Intravenous Once  . PARoxetine  40 mg Per Tube q morning - 10a  . phenytoin  200 mg Per Tube BID  . piperacillin-tazobactam (ZOSYN)  IV  3.375 g Intravenous Q8H  . pregabalin  150 mg Oral BID  . sodium chloride  3 mL Intravenous Q12H  . vancomycin  500 mg Intravenous Q12H   Continuous Infusions: . sodium chloride 1,000 mL (05/14/12 1117)    Active Problems:   Fever   Diabetes mellitus   Normocytic anemia   Protein-calorie malnutrition, severe   HTN (hypertension)   Acute encephalopathy   Hyponatremia   Acute blood loss anemia   Aspiration pneumonia    Time spent: 30 minutes    [facility]  [facility] Pager [phone].   If 8PM-8AM, please contact night-coverage at [email or web address], password [facility] 05/14/2012, 2:30 PM  LOS: 4 days

## 2012-05-14 NOTE — Progress Notes (Signed)
I spoke with the patient's husband on the telephone and explained my reasoning for starting with an upper endoscopy along rather than a double procedure (endoscopy and colonoscopy). He is agreeable to this course of action.  Upper endoscopy has been scheduled for tomorrow around 10 AM, and an n.p.o. order and request for consent has been provided.  Florencia Reasons, M.D. [phone]

## 2012-05-14 NOTE — Progress Notes (Signed)
Speech Language Pathology Dysphagia Treatment Patient Details Name: MARGARETMARY PRISK MRN: [id number] DOB: [birth year] Today's Date: 05/14/2012 Time: 4098-1191 SLP Time Calculation (min): 26 min  Assessment / Plan / Recommendation Clinical Impression  Pt see for skilled dysphagia treatment to assess tolerance of po intake and to educate to precautions to minimize aspiration risk. Note palliative care visit Saturday.  Pt reports minimal intake due to her coughing during eating/drinking over the weekend.  Pt today appears comfortable without increased WOB, minimal fever 4/6 am.  Pt admits she was being fed with HOB lowered earlier in admit, recognizes need to be upright.  Pt observed with a few bites of jello and straw sips of diet soda.  No overt clinical indicators of aspiration but pt stated that the soda "would choke a [place] man".  Delayed swallow continues, most notably with jello, which increase pt's aspiration risk.    Rec continue comfort intake of clears with full supervision and strict precautions to minimize asp risk.  Note pt for possible EGD.    Pt agreeable to dysphagia plan - teach back used to assure comprehension.  SLP to follow up to assure spouse (who is caregiver) is reeducated to precautions.  Thanks.     Diet Recommendation  Continue with Current Diet: Thin liquid (clears)    SLP Plan Continue with current plan of care   Pertinent Vitals/Pain Afebrile, decreased   Swallowing Goals  SLP Swallowing Goals Swallow Study Goal #2 - Progress: Progressing toward goal  General Temperature Spikes Noted: No Respiratory Status: Room air Behavior/Cognition: Alert;Cooperative Oral Cavity - Dentition: Adequate natural dentition;Edentulous Patient Positioning: Postural control adequate for testing  Oral Cavity - Oral Hygiene   clear  Dysphagia Treatment Treatment focused on: Skilled observation of diet tolerance Treatment Methods/Modalities: Skilled observation;Differential  diagnosis Patient observed directly with PO's: Yes Type of PO's observed: Thin liquids (jello) Feeding: Total assist Liquids provided via: Straw Oral Phase Signs & Symptoms: Prolonged mastication;Prolonged bolus formation;Prolonged oral phase Pharyngeal Phase Signs & Symptoms: Suspected delayed swallow initiation Amount of cueing: Maximal   GO     Donavan Burnet, MS [facility] SLP [phone]

## 2012-05-15 ENCOUNTER — Encounter (HOSPITAL_COMMUNITY): Payer: Self-pay

## 2012-05-15 ENCOUNTER — Inpatient Hospital Stay (HOSPITAL_COMMUNITY): Payer: 59 | Admitting: Anesthesiology

## 2012-05-15 ENCOUNTER — Encounter (HOSPITAL_COMMUNITY)
Admission: EM | Disposition: A | Discharge status: Hospice | Payer: Self-pay | Source: Home / Self Care | Attending: Internal Medicine

## 2012-05-15 ENCOUNTER — Encounter (HOSPITAL_COMMUNITY): Payer: Self-pay | Admitting: Anesthesiology

## 2012-05-15 HISTORY — PX: ESOPHAGOGASTRODUODENOSCOPY: SHX5428

## 2012-05-15 LAB — GLUCOSE, CAPILLARY
Glucose-Capillary: 127 mg/dL — ABNORMAL HIGH (ref 70–99)
Glucose-Capillary: 98 mg/dL (ref 70–99)
Glucose-Capillary: 93 mg/dL (ref 70–99)

## 2012-05-15 SURGERY — EGD (ESOPHAGOGASTRODUODENOSCOPY)
Anesthesia: Monitor Anesthesia Care

## 2012-05-15 SURGERY — ESOPHAGOGASTRODUODENOSCOPY (EGD) WITH PROPOFOL
Anesthesia: Monitor Anesthesia Care

## 2012-05-15 MED ORDER — MIDAZOLAM HCL 10 MG/2ML IJ SOLN
INTRAMUSCULAR | Status: AC
  Filled 2012-05-15: qty 4

## 2012-05-15 MED ORDER — PANTOPRAZOLE SODIUM 40 MG PO PACK
40.0000 mg | PACK | Freq: Every day | ORAL | Status: DC
  Administered 2012-05-15 – 2012-05-16 (×2): 40 mg
  Filled 2012-05-15 (×3): qty 20

## 2012-05-15 MED ORDER — SODIUM CHLORIDE 0.9 % IV SOLN: INTRAVENOUS | Status: DC

## 2012-05-15 MED ORDER — BUTAMBEN-TETRACAINE-BENZOCAINE 2-2-14 % EX AERO
INHALATION_SPRAY | CUTANEOUS | Status: DC | PRN
  Administered 2012-05-15: 1 via TOPICAL

## 2012-05-15 MED ORDER — FENTANYL CITRATE 0.05 MG/ML IJ SOLN
INTRAMUSCULAR | Status: AC
  Filled 2012-05-15: qty 4

## 2012-05-15 MED ORDER — MIDAZOLAM HCL 10 MG/2ML IJ SOLN
INTRAMUSCULAR | Status: DC | PRN
  Administered 2012-05-15 (×6): .5 mg via INTRAVENOUS

## 2012-05-15 NOTE — Anesthesia Preprocedure Evaluation (Deleted)
Anesthesia Evaluation  Patient identified by MRN, date of birth, ID band Patient awake    Reviewed: Allergy & Precautions, H&P , NPO status , Patient's Chart, lab work & pertinent test results  Airway Mallampati: II TM Distance: <3 FB Neck ROM: Limited    Dental no notable dental hx.    Pulmonary  parkinsons dz  H/O aspiration pneumonia breath sounds clear to auscultation  Pulmonary exam normal       Cardiovascular hypertension, Pt. on medications Rhythm:Regular Rate:Normal     Neuro/Psych negative psych ROS   GI/Hepatic negative GI ROS, Neg liver ROS,   Endo/Other  diabetes, Insulin Dependent  Renal/GU negative Renal ROS  negative genitourinary   Musculoskeletal negative musculoskeletal ROS (+)   Abdominal   Peds negative pediatric ROS (+)  Hematology  (+) Blood dyscrasia, anemia ,   Anesthesia Other Findings   Reproductive/Obstetrics negative OB ROS                           Anesthesia Physical Anesthesia Plan  ASA: III  Anesthesia Plan: MAC   Post-op Pain Management:    Induction: Intravenous  Airway Management Planned: Nasal Cannula  Additional Equipment:   Intra-op Plan:   Post-operative Plan:   Informed Consent: I have reviewed the patients History and Physical, chart, labs and discussed the procedure including the risks, benefits and alternatives for the proposed anesthesia with the patient or authorized representative who has indicated his/her understanding and acceptance.   Dental advisory given  Plan Discussed with: CRNA and Surgeon  Anesthesia Plan Comments:         Anesthesia Quick Evaluation

## 2012-05-15 NOTE — Interval H&P Note (Signed)
History and Physical Interval Note:  05/15/2012 2:49 PM  [name]  has presented today for surgery, with the diagnosis of Heme positive stool and anemia  The various methods of treatment have been discussed with the patient and family. After consideration of risks, benefits and other options for treatment, the patient has consented to  Procedure(s) with comments: ESOPHAGOGASTRODUODENOSCOPY (EGD) (N/A) - Can do with standard sedation if MAC unavailable as a surgical intervention .  The patient's history has been reviewed, patient examined, no change in status, stable for surgery.  I have reviewed the patient's chart and labs.  Questions were answered to the patient's satisfaction.     Florencia Reasons

## 2012-05-15 NOTE — H&P (View-Only) (Signed)
[facility] History and Physical  [name] MRN:[id number] DOB: 06/27/1940 DOA: 04/28/2012  Referring physician: EDP PCP: Dr. Randall Harris Specialists:   Chief Complaint: Decreased Responsiveness  HPI: [name] is a 71 y.o. female with Multiple medical problems who was sent to the ED due to increasing lethargy and decreased responsiveness over the past 2 days.  She began to have fevers and chills today.  Patient was recently hospitalized for HCAP and was continued on Augmentin therapy as an outpatient.  She has severe parkinson's and dysphagia and receives her feeding via a PEG tube.   She is Obtunded and unable to give a history, so her husband was called and he gave the series of events and her medical history.   She has been bedbound for 8 years and lives at home with her husband and received home care.      Review of Systems: Unable to Obtain from the Patient  Past Medical History  Diagnosis Date  . Diabetes mellitus   . Renal disorder   . CP (cerebral palsy)   . Parkinsonism   . Dysphagia   . Recurrent aspiration pneumonia   . Bladder spasms   . Amputation of arm   . Recurrent UTI   . Renal cancer     s/p nephrectomy     Past Surgical History  Procedure Laterality Date  . 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 Peg tube placement    . Knee surgery    . Nephrectomy       Medications:  HOME MEDS: Prior to Admission medications   Medication Sig Start Date End Date Taking? Authorizing Provider  antiseptic oral rinse (BIOTENE) LIQD 15 mLs by Mouth Rinse route 2 times daily at 12 noon and 4 pm. 04/21/12   Prashant K Singh, MD  clonazePAM (KLONOPIN) 0.5 MG tablet Place 1 tablet (0.5 mg total) into feeding tube 2 (two) times daily as needed for anxiety. 04/25/12   Orlando Vega, MD  diphenoxylate-atropine (LOMOTIL) 2.5-0.025 MG per tablet Place 1 tablet into feeding tube 3 (three) times daily as needed. For diarrhea    Historical  Provider, MD  fenofibrate (TRICOR) 145 MG tablet Place 145 mg into feeding tube at bedtime.     Historical Provider, MD  HYDROcodone-acetaminophen (NORCO/VICODIN) 5-325 MG per tablet Place 1 tablet into feeding tube 2 (two) times daily as needed for pain. 04/25/12   Orlando Vega, MD  hydrocortisone cream 1 % Apply 1 application topically 3 (three) times daily. Applied to affected areas.    Historical Provider, MD  insulin aspart (NOVOLOG) 100 UNIT/ML injection Inject 0-15 Units into the skin every 4 (four) hours. Before each meal 3 times a day, 140-199 - 2 units, 200-250 - 4 units, 251-299 - 6 units,  300-349 - 8 units,  350 or above 10 units. 04/21/12   Prashant K Singh, MD  insulin detemir (LEVEMIR) 100 UNIT/ML injection Inject 30 Units into the skin 2 (two) times daily. 04/21/12   Prashant K Singh, MD  levETIRAcetam (KEPPRA) 1000 MG tablet Place 1,000 mg into feeding tube 2 (two) times daily.    Historical Provider, MD  metFORMIN (GLUCOPHAGE) 1000 MG tablet Place 1,000 mg into feeding tube 2 (two) times daily with a meal.    Historical Provider, MD  metoprolol tartrate (LOPRESSOR) 100 MG tablet Place 1 tablet (100 mg total) into feeding tube 2 (two) times daily. 04/21/12   Prashant K Singh, MD    Multiple Vitamin (MULTIVITAMIN WITH MINERALS) TABS Place 1 tablet into feeding tube daily. 04/21/12   Prashant K Singh, MD  Nutritional Supplements (FEEDING SUPPLEMENT, GLUCERNA 1.2 CAL,) LIQD Place 237 mLs into feeding tube 5 (five) times daily. 04/25/12   Orlando Vega, MD  nystatin (MYCOSTATIN/NYSTOP) 100000 UNIT/GM POWD Apply topically 3 (three) times daily. Applied to groin rash.    Historical Provider, MD  PARoxetine (PAXIL) 40 MG tablet Place 40 mg into feeding tube every morning.     Historical Provider, MD  phenytoin (DILANTIN) 100 MG ER capsule Place 300 mg into feeding tube daily.    Historical Provider, MD  pregabalin (LYRICA) 150 MG capsule Place 150 mg into feeding tube 2 (two) times daily.      Historical Provider, MD  ranitidine (ZANTAC) 300 MG tablet Place 300 mg into feeding tube at bedtime.     Historical Provider, MD    Allergies:  Allergies  Allergen Reactions  . Codeine Nausea And Vomiting  . Morphine And Related Nausea And Vomiting    Social History:   reports that she has never smoked. She has never used smokeless tobacco. She reports that she does not drink alcohol or use illicit drugs.  Family History: Family History  Problem Relation Age of Onset  . CAD Mother   . CAD Mother       Physical Exam:  GEN:  Obtunded Elderly Obese 71 year old Caucasian Female examined  and in no acute distress;  Filed Vitals:   04/28/12 2030 04/28/12 2045 04/28/12 2100 04/28/12 2145  BP: 137/47 148/52 135/50 154/74  Pulse: 110 112 117 114  Temp: 100.9 F (38.3 C) 101.3 F (38.5 C) 101.5 F (38.6 C) 100 F (37.8 C)  TempSrc:    Axillary  Resp: 21 22 18 18  SpO2: 93% 95% 94% 93%   Blood pressure 154/74, pulse 114, temperature 100 F (37.8 C), temperature source Axillary, resp. rate 18, SpO2 93.00%. PSYCH: SHe is Obtunded;  HEENT: Normocephalic and Atraumatic, Mucous membranes pink; PERRLA; EOM intact; Fundi:  Benign;  No scleral icterus, Nares: Patent, Oropharynx: Clear, Fair Dentition, Neck:  FROM, no cervical lymphadenopathy nor thyromegaly or carotid bruit; no JVD; Breasts:: Not examined CHEST WALL: No tenderness CHEST: Normal respiration, clear to auscultation bilaterally HEART: Regular rate and rhythm; no murmurs rubs or gallops BACK: No kyphosis or scoliosis; no CVA tenderness ABDOMEN: Positive Bowel Sounds, Obese, soft non-tender; no masses, no organomegaly.   Rectal Exam: Not done EXTREMITIES: + Atrophy of Both lower Extremities,  No cyanosis, clubbing or edema; no ulcerations. 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    CNS:  Obtunded, Generalized weakness, Withdraws to painful stimuli,     Labs & Imaging Results for orders placed during the hospital encounter of 04/28/12 (from the past 48 hour(s))  GLUCOSE, CAPILLARY     Status: Abnormal   Collection Time    04/28/12  6:18 PM      Result Value Range   Glucose-Capillary 198 (*) 70 - 99 mg/dL   Comment 1 Notify RN     Comment 2 Documented in Chart    URINALYSIS, ROUTINE W REFLEX MICROSCOPIC     Status: Abnormal   Collection Time    04/28/12  7:03 PM      Result Value Range   Color, Urine YELLOW  YELLOW   APPearance TURBID (*) CLEAR     Specific Gravity, Urine 1.015  1.005 - 1.030   pH 6.5  5.0 - 8.0   Glucose, UA NEGATIVE  NEGATIVE mg/dL   Hgb urine dipstick MODERATE (*) NEGATIVE   Bilirubin Urine NEGATIVE  NEGATIVE   Ketones, ur NEGATIVE  NEGATIVE mg/dL   Protein, ur NEGATIVE  NEGATIVE mg/dL   Urobilinogen, UA 0.2  0.0 - 1.0 mg/dL   Nitrite NEGATIVE  NEGATIVE   Leukocytes, UA LARGE (*) NEGATIVE  URINE MICROSCOPIC-ADD ON     Status: Abnormal   Collection Time    04/28/12  7:03 PM      Result Value Range   Squamous Epithelial / LPF FEW (*) RARE   WBC, UA TOO NUMEROUS TO COUNT  <3 WBC/hpf   RBC / HPF FIELD OBSCURED BY WBC'S  <3 RBC/hpf   Bacteria, UA MANY (*) RARE   Urine-Other MANY YEAST    CBC WITH DIFFERENTIAL     Status: Abnormal   Collection Time    04/28/12  7:18 PM      Result Value Range   WBC 13.0 (*) 4.0 - 10.5 K/uL   RBC 4.42  3.87 - 5.11 MIL/uL   Hemoglobin 12.7  12.0 - 15.0 g/dL   HCT 39.9  36.0 - 46.0 %   MCV 90.3  78.0 - 100.0 fL   MCH 28.7  26.0 - 34.0 pg   MCHC 31.8  30.0 - 36.0 g/dL   RDW 14.8  11.5 - 15.5 %   Platelets 325  150 - 400 K/uL   Neutrophils Relative 75  43 - 77 %   Neutro Abs 9.7 (*) 1.7 - 7.7 K/uL   Lymphocytes Relative 20  12 - 46 %   Lymphs Abs 2.6  0.7 - 4.0 K/uL   Monocytes Relative 5  3 - 12 %   Monocytes Absolute 0.7  0.1 - 1.0 K/uL   Eosinophils Relative 0  0 - 5 %   Eosinophils Absolute 0.0  0.0 -  0.7 K/uL   Basophils Relative 0  0 - 1 %   Basophils Absolute 0.0  0.0 - 0.1 K/uL  LACTIC ACID, PLASMA     Status: None   Collection Time    04/28/12  7:18 PM      Result Value Range   Lactic Acid, Venous 1.4  0.5 - 2.2 mmol/L  COMPREHENSIVE METABOLIC PANEL     Status: Abnormal   Collection Time    04/28/12  7:18 PM      Result Value Range   Sodium 131 (*) 135 - 145 mEq/L   Potassium 4.9  3.5 - 5.1 mEq/L   Chloride 93 (*) 96 - 112 mEq/L   CO2 25  19 - 32 mEq/L   Glucose, Bld 200 (*) 70 - 99 mg/dL   BUN 31 (*) 6 - 23 mg/dL   Creatinine, Ser 0.92  0.50 - 1.10 mg/dL   Calcium 9.9  8.4 - 10.5 mg/dL   Total Protein 8.2  6.0 - 8.3 g/dL   Albumin 3.1 (*) 3.5 - 5.2 g/dL   AST 29  0 - 37 U/L   ALT 30  0 - 35 U/L   Alkaline Phosphatase 100  39 - 117 U/L   Total Bilirubin 0.3  0.3 - 1.2 mg/dL   GFR calc non Af Amer 62 (*) >90 mL/min   GFR calc Af Amer 72 (*) >90 mL/min   Comment:            The   eGFR has been calculated     using the CKD EPI equation.     This calculation has not been     validated in all clinical     situations.     eGFR's persistently     <90 mL/min signify     possible Chronic Kidney Disease.  URINE RAPID DRUG SCREEN (HOSP PERFORMED)     Status: None   Collection Time    04/28/12  7:33 PM      Result Value Range   Opiates NONE DETECTED  NONE DETECTED   Cocaine NONE DETECTED  NONE DETECTED   Benzodiazepines NONE DETECTED  NONE DETECTED   Amphetamines NONE DETECTED  NONE DETECTED   Tetrahydrocannabinol NONE DETECTED  NONE DETECTED   Barbiturates NONE DETECTED  NONE DETECTED   Comment:            DRUG SCREEN FOR MEDICAL PURPOSES     ONLY.  IF CONFIRMATION IS NEEDED     FOR ANY PURPOSE, NOTIFY LAB     WITHIN 5 DAYS.                LOWEST DETECTABLE LIMITS     FOR URINE DRUG SCREEN     Drug Class       Cutoff (ng/mL)     Amphetamine      1000     Barbiturate      200     Benzodiazepine   200     Tricyclics       300     Opiates          300     Cocaine           300     THC              50    Radiological Exams on Admission: Dg Chest Port 1 View  04/28/2012  *RADIOLOGY REPORT*  Clinical Data: Short of breath  PORTABLE CHEST - 1 VIEW  Comparison: 04/22/2012  Findings: Progression of bibasilar airspace disease which is most consistent with atelectasis.  There is decreased lung volume. Negative for heart failure or edema.  Small left effusion.  IMPRESSION: Increased bibasilar airspace disease, most consistent with atelectasis.  Small left effusion has developed.   Original Report Authenticated By: David Clark, M.D.     EKG: Sinus Tachycardia  Assessment/Plan Principal Problem:   Acute encephalopathy Active Problems:   Fever   Diabetes mellitus   Sinus tachycardia   Parkinson's disease   Hyponatremia   Dehydration  1.   Acute Encephalopathy-  May be due to Fever and recurrence of PNA, and possible UTI or Seizure,  Neuro checks, ordered. Slight improvement in ED after hydration.     2.   Fever-  Recent HCAP, went home on Augmentin Rx,  Has PEG tube and High risk of Aspiration even on oral secretions.   Blood cultures sent,  IV Antibiotics of                                     Vancomycin and Cefepime started.   Continue IVFS, and Anti-pyretics PRN.     3.   Diabetes Mellitus-  Monitor Gluconse levels q 4 hours, SSI coverage PRN.     4.   Sinus Tachycardia-   Should Improve with Hydration.  Monitor.     5.   Parkinson's Disease-     No Changes.     6.   Hyponatremia- Mild, should improve with NSS IVFs, Monitor Na+ Levels.     7.  Dehydration-   Should improve with Hydration.   Monitor BUN/Cr.        Code Status:    FULL CODE Family Communication:    Discussed with Husband Over Telephone  [phone].    Disposition Plan:   Return to Home on Discharge  Time spent:   60 Minutes  Jelani Vreeland C [facility] Pager [phone]  If 7PM-7AM, please contact night-coverage [email or web address] Password [email or web address] 04/28/2012, 10:25  PM    

## 2012-05-15 NOTE — Op Note (Signed)
[facility] [address] [place], [address]   ENDOSCOPY PROCEDURE REPORT  PATIENT: Joann, Mclaughlin  MR#: [id number] BIRTHDATE: 05-Jul-1940 , 71  yrs. old GENDER: Female ENDOSCOPIST:Dimitriy Carreras, MD REFERRED BY:  [facility] PROCEDURE DATE:  05/15/2012 PROCEDURE:      upper endoscopy ASA CLASS: INDICATIONS:   significant drop in hemoglobin without witness GI bleeding, since patient's recent hospitalization roughly week earlier. Patient presented with mental status changes MEDICATION:    Versed 3 mg IV TOPICAL ANESTHETIC:    Cetacaine spray  DESCRIPTION OF PROCEDURE:   consent was obtained from the patient's husband and the patient, who has significant neurologic problems, was brought from her hospital room to the endoscopy unit. She remained stable throughout the procedure. I intentionally use very light sedation and use the Pentax pediatric upper endoscope which was passed easily under direct vision.  The larynx looked grossly normal in the esophagus was entered without undue difficulty.  The esophagus was normal except for a widely patent Schatzki's ring at the squamocolumnar junction, below which was a 2 cm hiatal hernia. No Mallory-Weiss tear, varices, infection, neoplasia, or reflux esophagitis were noted.  The stomach was entered. It contained no blood or coffee-ground material nor any significant residual. The patient's G-tube internal bolster was readily apparent. It was in good position and there was no evidence of mucosal ulceration at the entry site of the tube into the stomach.  On the greater curve aspect of the proximal antrum, there were 2 shallow small ulcers, the larger being approximately 3 x 6 mm in size. These had a completely clean base and were completely benign in appearance. There was no stigma of recent hemorrhage.  The stomach was otherwise normal, including a retroflex view of the cardia.  The pylorus, duodenal bulb, and  second duodenum looked normal.  The scope was then removed and the patient tolerated the procedure well.     COMPLICATIONS: None  ENDOSCOPIC IMPRESSION:  1. No active bleeding or blood in the stomach at the time this exam.  2. 2 small antral ulcers which do not appear to be at risk for further bleeding but which, prior to in house medical therapy, could conceivably account for the patient's recent drop in hemoglobin  3. Small hiatal hernia with Schatzki's ring  4. Gastrostomy tube in appropriate position  RECOMMENDATIONS:  1. Okay to feed the patient and, from the GI tract standpoint, to release her from the hospital at any time  2. I discussed these findings with the patient's husband and have recommended that, since we have a reasonable explanation for the patient's bleeding, that we not pursue colonoscopic evaluation, taking into account the fact that his only been a few years since her last colonoscopy, and taking into consideration her frail medical status. He is agreeable.  3. I would treat this patient with PPI therapy indefinitely, both to heal the current ulcers and to prophylax against future ulcers    _______________________________ eSignedBernette Redbird, MD 05/15/2012 3:39 PM    PATIENT NAME:  Joann, Mclaughlin MR#: [id number]

## 2012-05-15 NOTE — Progress Notes (Signed)
The patient tolerated her endoscopy very well, and I have reviewed the results with her husband on the telephone, and also with the patient's attending physician.  I have thought that the patient had had colonoscopic evaluation in the past, but I do not see that on our office records.  Nonetheless, given that this patient has a plausible explanation for her recent drop in hemoglobin, taking into consideration that no blood per rectum was identified (which almost certainly would have been seen with a 3 g drop in hemoglobin if it were of lower tract origin), and given that the patient is in palliative care mode, I do not feel it is prudent or necessary to put her through a colonoscopy. Certainly, if the patient were to have a recurrent bleed in the near future, we could reconsider this decision.  For that reason, I think the patient can be treated with a PPI once a day (Nexium capsules can be opened and the granules placed in apple juice and flushed through the tube, or Zegerid comes as a powder form that is dissolved in 1 tablespoon of water, which could be flushed through the tube). I would keep her on this medication indefinitely to prevent future ulcers.  From the GI standpoint, the patient can be released at any time. I will plan to sign off. Please call if we can be of further assistance with this very pleasant patient.  Florencia Reasons, M.D. [phone]

## 2012-05-15 NOTE — Progress Notes (Signed)
SLP Cancellation Note  Patient Details Name: [name] MRN: [id number] DOB: 1940-06-17   Cancelled treatment:       Reason Eval/Treat Not Completed:  (pt npo for upper endoscopy)  Will follow up this week to educate spouse to aspiration precautions.   Donavan Burnet, MS [facility] SLP [phone]

## 2012-05-15 NOTE — Progress Notes (Signed)
[facility] PROGRESS NOTE  [name] HYQ:[id number] DOB: 03/18/1940 DOA: 05/10/2012 PCP: Johny Blamer, MD  Brief narrative 72 year old female patient with history of CP, possible MR, parkinsonism, DM 2, renal cancer, dysphagia with recurrent aspiration pneumonia, bladder spasms, multiple recent hospitalizations, presented on 05/10/2012 with altered mental status and fever of 101.56F. She was recently treated for UTI and HCAP. At baseline patient apparently is bed bound and requires full assist for all activities of daily living. In the emergency department, hemoglobin was 5.8 and she was transfused 2 units of PRBCs. Empiric broad-spectrum IV antibiotics-vancomycin and cefepime were started.  Assessment/Plan: 1. Acute encephalopathy: Likely secondary to acute medical illness (fever, anemia, dehydration, AKI) complicating underlying CP/MR. No focal deficits. Monitor. May have resolved. 2. Anemia/FOBT +: Unclear etiology. Hemoccult-positive in ED. No overt source of bleeding otherwise. GI consultation appreciated. Improved after 2 units PRBC. Stable. EGD showed 2 small non bleeding antral ulcers. PPI indefinitely via tube. 3. Fever and leukocytosis/recurrent aspiration pneumonia: Patient was started on IV vancomycin and Zosyn empirically. CT chest - R>L pleural effusion, basilar Atx Vs infiltrate. ? Another episode of aspiration pneumonia. If continues to spike fevers despite antibiotics, may consider diagnostic right thoracentesis/? Parapneumonic effusion. Per report, patient continues to be fed by mouth at home despite high risks for aspiration. Cultures are negative to date. Continue Augmentin and complete total 10-14 days antibiotics. 4. Hypertension: Controlled. Blood pressures were soft in ED. Hydralazine held. Continue low-dose metoprolol. 5. Type 2 diabetes mellitus: Uncontrolled. Resumed Levemir and continue SSI. Improved control. 6. Chest discomfort: Resolved. Troponins negative.  Leukocytosis resolved. 7. ? Urinary Retention: Foley's catheter was removed on 4/4 and overnight had difficulty voiding and had in & out cath done. Seems to have resolved.  Code Status: Full Family Communication: No family at bedside. Disposition Plan: Home when medically stable.   Consultants:  Gastroenterology  Palliative care team  Procedures:  EGD 4/8  Antibiotics:  IV vancomycin 4/3 > 4/7  IV Zosyn 4/3 > 4/7   Augmentin via tube 4/7 >  HPI/Subjective: Patient denies complaints. Denies dyspnea or pain.   Objective: Filed Vitals:   05/15/12 1530 05/15/12 1540 05/15/12 1550 05/15/12 1555  BP: 149/78 144/70 154/70   Pulse: 86     Temp:      TempSrc:      Resp: 14 14 13    Height:      Weight:      SpO2: 92% 92% 91% 94%    Intake/Output Summary (Last 24 hours) at 05/15/12 1846 Last data filed at 05/15/12 1842  Gross per 24 hour  Intake   1557 ml  Output      1 ml  Net   1556 ml   Filed Weights   05/10/12 1316 05/10/12 1755  Weight: 68.8 kg (151 lb 10.8 oz) 70.4 kg (155 lb 3.3 oz)    Exam:   General exam: Comfortable.  Respiratory system: Reduced breath sounds in the bases but otherwise clear to auscultation. No increased work of breathing.  Cardiovascular system: S1 & S2 heard, RRR. No JVD, murmurs, gallops, clicks or pedal edema.   Gastrointestinal system: Abdomen is nondistended, soft and nontender. Normal bowel sounds heard. PEG tube intact.  Central nervous system: Alert and oriented to self and place. No focal neurological deficits.  Ears: Tiny approximately 0.5 cm dark spot on right pinna. No other acute findings.  Extremities: Left mid forearm amputation. Symmetric 5/5 power in upper extremities. Chronically unable to move lower extremities.  Data Reviewed: Basic Metabolic Panel:  Recent Labs Lab 05/10/12 1330 05/11/12 0930 05/12/12 0515  NA 130* 135 135  K 4.7 4.2 3.8  CL 92* 99 101  CO2 26 24 24   GLUCOSE 165* 219* 202*  BUN  34* 27* 22  CREATININE 0.83 0.64 0.61  CALCIUM 10.1 8.6 8.6   Liver Function Tests:  Recent Labs Lab 05/10/12 1330 05/11/12 0930  AST 23 20  ALT 16 16  ALKPHOS 70 57  BILITOT 0.2* 0.2*  PROT 8.1 6.7  ALBUMIN 2.6* 2.2*   No results found for this basename: LIPASE, AMYLASE,  in the last 168 hours No results found for this basename: AMMONIA,  in the last 168 hours CBC:  Recent Labs Lab 05/10/12 1330 05/11/12 0930 05/12/12 0515 05/13/12 0445  WBC 18.6* 7.1 4.8 5.9  NEUTROABS 14.3*  --   --   --   HGB 5.8* 9.4* 9.3* 9.7*  HCT 18.1* 28.7* 28.6* 30.1*  MCV 89.6 87.2 87.2 88.3  PLT 565* 322 298 359   Cardiac Enzymes:  Recent Labs Lab 05/11/12 0045 05/11/12 0930  TROPONINI <0.30 <0.30   BNP (last 3 results) No results found for this basename: PROBNP,  in the last 8760 hours CBG:  Recent Labs Lab 05/14/12 2211 05/15/12 0745 05/15/12 1207 05/15/12 1310 05/15/12 1710  GLUCAP 175* 127* 98 93 70    Recent Results (from the past 240 hour(s))  CULTURE, BLOOD (ROUTINE X 2)     Status: None   Collection Time    05/10/12  1:30 PM      Result Value Range Status   Specimen Description BLOOD RIGHT ARM   Final   Special Requests BOTTLES DRAWN AEROBIC AND ANAEROBIC 4CC   Final   Culture  Setup Time 05/10/2012 17:09   Final   Culture     Final   Value:        BLOOD CULTURE RECEIVED NO GROWTH TO DATE CULTURE WILL BE HELD FOR 5 DAYS BEFORE ISSUING A FINAL NEGATIVE REPORT   Report Status PENDING   Incomplete  CULTURE, BLOOD (ROUTINE X 2)     Status: None   Collection Time    05/10/12  1:45 PM      Result Value Range Status   Specimen Description BLOOD RIGHT ARM   Final   Special Requests BOTTLES DRAWN AEROBIC AND ANAEROBIC [facility]   Final   Culture  Setup Time 05/10/2012 17:09   Final   Culture     Final   Value:        BLOOD CULTURE RECEIVED NO GROWTH TO DATE CULTURE WILL BE HELD FOR 5 DAYS BEFORE ISSUING A FINAL NEGATIVE REPORT   Report Status PENDING   Incomplete   URINE CULTURE     Status: None   Collection Time    05/11/12  3:34 PM      Result Value Range Status   Specimen Description URINE, CLEAN CATCH   Final   Special Requests NONE   Final   Culture  Setup Time 05/12/2012 00:33   Final   Colony Count 5,000 COLONIES/ML   Final   Culture INSIGNIFICANT GROWTH   Final   Report Status 05/12/2012 FINAL   Final     Studies: No results found.   Additional labs:   Scheduled Meds: . amoxicillin-clavulanate  1 tablet Per Tube Q12H  . antiseptic oral rinse  15 mL Mouth Rinse BID  . famotidine  40 mg Per Tube Daily  . feeding  supplement (GLUCERNA 1.2 CAL)  237 mL Per Tube 6 X Daily  . fenofibrate  160 mg Per Tube Daily  . free water  200 mL Per Tube Q6H  . insulin aspart  0-15 Units Subcutaneous TID WC  . insulin aspart  0-5 Units Subcutaneous QHS  . insulin detemir  40 Units Subcutaneous BID  . levETIRAcetam  1,000 mg Per Tube BID  . metoprolol tartrate  12.5 mg Oral BID  . multivitamin with minerals  1 tablet Oral q morning - 10a  . pantoprazole (PROTONIX) IV  40 mg Intravenous Once  . PARoxetine  40 mg Per Tube q morning - 10a  . phenytoin  200 mg Per Tube BID  . pregabalin  150 mg Oral BID  . sodium chloride  3 mL Intravenous Q12H   Continuous Infusions: . sodium chloride 1,000 mL (05/15/12 0727)    Active Problems:   Fever   Diabetes mellitus   Normocytic anemia   Protein-calorie malnutrition, severe   HTN (hypertension)   Acute encephalopathy   Hyponatremia   Acute blood loss anemia   Aspiration pneumonia    Time spent: 30 minutes    [facility]  [facility] Pager [phone].   If 8PM-8AM, please contact night-coverage at [email or web address], password [facility] 05/15/2012, 6:46 PM  LOS: 5 days

## 2012-05-16 ENCOUNTER — Encounter (HOSPITAL_COMMUNITY): Payer: Self-pay | Admitting: Gastroenterology

## 2012-05-16 LAB — CULTURE, BLOOD (ROUTINE X 2): Culture: NO GROWTH

## 2012-05-16 LAB — GLUCOSE, CAPILLARY: Glucose-Capillary: 160 mg/dL — ABNORMAL HIGH (ref 70–99)

## 2012-05-16 MED ORDER — RANITIDINE HCL 300 MG PO TABS: 300.0000 mg | ORAL_TABLET | Freq: Every day | ORAL | Status: DC

## 2012-05-16 MED ORDER — LEVETIRACETAM 1000 MG PO TABS: 1000.0000 mg | ORAL_TABLET | Freq: Two times a day (BID) | ORAL | Status: DC

## 2012-05-16 MED ORDER — AMOXICILLIN-POT CLAVULANATE 875-125 MG PO TABS: 1.0000 | ORAL_TABLET | Freq: Two times a day (BID) | ORAL | Status: DC

## 2012-05-16 MED ORDER — HYDRALAZINE HCL 25 MG PO TABS: 25.0000 mg | ORAL_TABLET | Freq: Three times a day (TID) | ORAL | Status: AC

## 2012-05-16 NOTE — Progress Notes (Signed)
CSW consulted for transportation needs. Patient will need non-emergency ambulance transport home. CSW confirmed home address with patient. [facility] called for 5:00 pickup transport. No other CSW needs identified - CSW signing off.   Unice Bailey, LCSW [facility] Clinical Social Worker cell #: [phone]

## 2012-05-16 NOTE — Progress Notes (Signed)
Received referral for home hospice; patient asleep; spouse Amana Bouska called for hospice arrangements- choices offered, Dorene Sorrow chose [facility]; [place] with [facility] called for arrangements; B Jonovan Boedecker RN,BSN,MHA.

## 2012-05-16 NOTE — Progress Notes (Signed)
Discharge summary faxed to [facility] as requested; Abelino Derrick RN

## 2012-05-16 NOTE — Discharge Summary (Signed)
Physician Discharge Summary  [name] ZOX:[id number] DOB: 08/21/1940 DOA: 05/10/2012  PCP: Johny Blamer, MD  Admit date: 05/10/2012 Discharge date: 05/16/2012  Time spent: 40 minutes  Recommendations for Outpatient Follow-up:  1. Please follow up with hospice services as scheduled  Discharge Diagnoses:  Active Problems:   Fever   Diabetes mellitus   Normocytic anemia   Protein-calorie malnutrition, severe   HTN (hypertension)   Acute encephalopathy   Hyponatremia   Acute blood loss anemia   Aspiration pneumonia  Discharge Condition: stable  Diet recommendation: heart healthy  Filed Weights   05/10/12 1316 05/10/12 1755  Weight: 68.8 kg (151 lb 10.8 oz) 70.4 kg (155 lb 3.3 oz)   History of present illness:  72 year old female patient with history of CP, possible MR, parkinsonism, DM 2, renal cancer, dysphagia with recurrent aspiration pneumonia, bladder spasms, multiple recent hospitalizations, presented on 05/10/2012 with altered mental status and fever of 101.74F. She was recently treated for UTI and HCAP. At baseline patient apparently is bed bound and requires full assist for all activities of daily living. In the emergency department, hemoglobin was 5.8 and she was transfused 2 units of PRBCs. Empiric broad-spectrum IV antibiotics-vancomycin and cefepime were started.  Hospital Course:  1. Acute encephalopathy: Likely secondary to acute medical illness (fever, anemia, dehydration, AKI) complicating underlying CP/MR. No focal deficits and has resolved on discharge 2. Anemia/FOBT +: Unclear etiology. Hemoccult-positive in ED. No overt source of bleeding otherwise. GI consultation appreciated. Improved after 2 units PRBC. Stable. EGD showed 2 small non bleeding antral ulcers. PPI indefinitely via tube. 3. Fever and leukocytosis/recurrent aspiration pneumonia: Patient was started on IV vancomycin and Zosyn empirically. CT chest - R>L pleural effusion, basilar Atx Vs  infiltrate. Parapneumonic effusion. Per report, patient continues to be fed by mouth at home despite high risks for aspiration. Cultures are negative to date. Continue Augmentin and complete total 10-14 days antibiotics. 4. Hypertension: Controlled. 5. Type 2 diabetes mellitus: Uncontrolled. Resumed Levemir and continue SSI. Improved control. 6. Chest discomfort: Resolved. Troponins negative. Leukocytosis resolved. 7. ? Urinary Retention: Foley's catheter was removed on 4/4 and overnight had difficulty voiding and had in & out cath done. Seems to have resolved.  Procedures:  none   Consultations:  Palliative care  GI  Discharge Exam: Filed Vitals:   05/15/12 2143 05/15/12 2315 05/16/12 0714 05/16/12 1345  BP: 160/60 148/57 158/62 148/64  Pulse: 84  100 88  Temp: 98.5 F (36.9 C)  97.5 F (36.4 C) 98.4 F (36.9 C)  TempSrc: Oral  Oral Oral  Resp: 14  12 16   Height:      Weight:      SpO2: 98%  97% 97%    General: NAD Cardiovascular: RRR Respiratory: CTA biL  Discharge Instructions    Medication List    TAKE these medications       amoxicillin-clavulanate 875-125 MG per tablet  Commonly known as:  AUGMENTIN  Place 1 tablet into feeding tube every 12 (twelve) hours.     antiseptic oral rinse Liqd  15 mLs by Mouth Rinse route 2 (two) times daily.     clonazePAM 0.5 MG tablet  Commonly known as:  KLONOPIN  Place 1 tablet (0.5 mg total) into feeding tube at bedtime as needed for anxiety.     diphenoxylate-atropine 2.5-0.025 MG per tablet  Commonly known as:  LOMOTIL  Place 1 tablet into feeding tube 3 (three) times daily as needed. For diarrhea     feeding supplement (  GLUCERNA 1.2 CAL) Liqd  Place 237 mLs into feeding tube 5 (five) times daily.     fenofibrate 145 MG tablet  Commonly known as:  TRICOR  Place 145 mg into feeding tube at bedtime.     hydrALAZINE 25 MG tablet  Commonly known as:  APRESOLINE  Place 1 tablet (25 mg total) into feeding tube 3  (three) times daily.     HYDROcodone-acetaminophen 5-325 MG per tablet  Commonly known as:  NORCO/VICODIN  Place 1 tablet into feeding tube 2 (two) times daily as needed for pain.     hydrocortisone cream 1 %  Apply 1 application topically 3 (three) times daily as needed (for rash on neck/back.).     insulin aspart 100 UNIT/ML injection  Commonly known as:  novoLOG  Inject 0-15 Units into the skin every 4 (four) hours. Before each meal 3 times a day, 140-199 - 2 units, 200-250 - 4 units, 251-299 - 6 units,  300-349 - 8 units,  350 or above 10 units.     insulin detemir 100 UNIT/ML injection  Commonly known as:  LEVEMIR  Inject 0.4 mLs (40 Units total) into the skin 2 (two) times daily.     levETIRAcetam 1000 MG tablet  Commonly known as:  KEPPRA  Place 1,000 mg into feeding tube 2 (two) times daily.     metFORMIN 1000 MG tablet  Commonly known as:  GLUCOPHAGE  Place 1,000 mg into feeding tube 2 (two) times daily with a meal.     metoprolol 100 MG tablet  Commonly known as:  LOPRESSOR  Place 1 tablet (100 mg total) into feeding tube 2 (two) times daily.     multivitamin with minerals Tabs  Take 1 tablet by mouth every morning.     nystatin 100000 UNIT/GM Powd  Apply 1 g topically 3 (three) times daily as needed (for groin rash.).     PARoxetine 40 MG tablet  Commonly known as:  PAXIL  Place 40 mg into feeding tube every morning.     phenytoin 125 MG/5ML suspension  Commonly known as:  DILANTIN  Place 8 mLs (200 mg total) into feeding tube 2 (two) times daily.     pregabalin 150 MG capsule  Commonly known as:  LYRICA  Place 150 mg into feeding tube 2 (two) times daily.     ranitidine 300 MG tablet  Commonly known as:  ZANTAC  Place 300 mg into feeding tube at bedtime.       Follow-up Information   Follow up with [facility] ([facility] to follow aftr d/c pls notify whn transport on unit & pt ready to d/c -call [facility] @ [phone] (or  [phone] after 5 pm))    Contact information:   [facility]  [address] [place], [place] [phone]       The results of significant diagnostics from this hospitalization (including imaging, microbiology, ancillary and laboratory) are listed below for reference.    Significant Diagnostic Studies: Dg Chest 2 View  05/10/2012  *RADIOLOGY REPORT*  Clinical Data: Fever.  Renal cancer  CHEST - 2 VIEW  Comparison: 04/28/2012  Findings: Improvement in bibasilar airspace disease compared with the prior study.  There remains elevation of the right hemidiaphragm, and mild atelectasis in the lung bases.  Small left effusion is improved.  There is no heart failure or edema.  IMPRESSION: Improvement in bibasilar airspace disease and small left effusion. Negative for heart failure.   Original Report Authenticated By:  Janeece Riggers, M.D.    Ct Head Wo Contrast  04/22/2012  *RADIOLOGY REPORT*  Clinical Data: 72 year old female with altered mental status and decreased level of consciousness.  CT HEAD WITHOUT CONTRAST  Technique:  Contiguous axial images were obtained from the base of the skull through the vertex without contrast.  Comparison: 04/16/2012 and prior head CTs dating back to 05/31/2005.  Findings: Chronic small vessel white matter ischemic changes are again identified. Remote lacunar infarcts are present within bilateral basal ganglia, right thalamus and mid brain. No acute intracranial abnormalities are identified, including mass lesion or mass effect, hydrocephalus, extra-axial fluid collection, midline shift, hemorrhage, or acute infarction.  The visualized bony calvarium is unremarkable except for surgical changes of the right frontal region.  IMPRESSION: No evidence of acute intracranial abnormality.  Chronic small vessel white matter ischemic changes and remote infarcts as described.   Original Report Authenticated By: Harmon Pier, M.D.    Ct Chest Wo Contrast  05/11/2012  *RADIOLOGY REPORT*   Clinical Data: Basilar infiltrates.  Aspiration pneumonia.  Altered mental status.  CT CHEST WITHOUT CONTRAST  Technique:  Multidetector CT imaging of the chest was performed following the standard protocol without IV contrast.  Comparison: None.  Findings: Right-sided central venous catheter with tip in the superior vena cava.  Mild cardiac enlargement with small pericardial effusion.  Normal caliber thoracic aorta.  Mediastinal lymph nodes are present without pathologic enlargement.  Small right pleural effusion with atelectasis or infiltration in the right lung base.  Mild atelectasis or infiltration in the left lung base.  Changes are nonspecific but would be compatible with aspiration in the appropriate clinical setting.  Airways are patent.  No pneumothorax.  Degenerative changes in the thoracic spine. Nonspecific area of low attenuation in the medial segment left lobe of the liver which could represent focal fatty infiltration.  Similar appearance as demonstrated on previous CT abdomen and pelvis from 03/03/2012.  IMPRESSION: Right pleural effusion with bilateral basilar atelectasis or infiltration.  Small pericardial effusion.   Original Report Authenticated By: Burman Nieves, M.D.    Dg Chest Port 1 View  05/10/2012  *RADIOLOGY REPORT*  Clinical Data: Line placement  PORTABLE CHEST - 1 VIEW  Comparison: 1444 hours  Findings: Right upper extremity PICC tip in the mid SVC.  Right pleural effusion worse.  Left lung is clear.  Normal heart size. No pneumothorax. Left pleural effusion improved.  Left basilar atelectasis improved.  IMPRESSION: Right PICC tip at the SVC.  Worsening right effusion.  Left pleural effusion and basilar atelectasis improved.   Original Report Authenticated By: Jolaine Click, M.D.    Dg Chest Port 1 View  04/28/2012  *RADIOLOGY REPORT*  Clinical Data: Short of breath  PORTABLE CHEST - 1 VIEW  Comparison: 04/22/2012  Findings: Progression of bibasilar airspace disease which is most  consistent with atelectasis.  There is decreased lung volume. Negative for heart failure or edema.  Small left effusion.  IMPRESSION: Increased bibasilar airspace disease, most consistent with atelectasis.  Small left effusion has developed.   Original Report Authenticated By: Janeece Riggers, M.D.    Dg Chest Portable 1 View  04/22/2012  *RADIOLOGY REPORT*  Clinical Data: Altered mental status  PORTABLE CHEST - 1 VIEW  Comparison: 04/16/2012.  Findings: Low volume chest.  No gross consolidation. Cardiopericardial silhouette and mediastinal contours within normal limits allowing for volumes. Monitoring leads are projected over the chest.  IMPRESSION: Low volume chest.   Original Report Authenticated By: Andreas Newport,  M.D.    Dg Chest Port 1 View  04/17/2012  *RADIOLOGY REPORT*  Clinical Data: Weakness, shortness of breath  PORTABLE CHEST - 1 VIEW  Comparison: Portable chest x-ray of 04/11/2012  Findings: There is still slight elevation of the right hemidiaphragm.  No infiltrate or effusion is seen.  The right PICC line is unchanged in position, with the tip overlying the mid upper SVC.  Heart size is stable.  IMPRESSION: No active infiltrate or effusion.  No change in right PICC line position.   Original Report Authenticated By: Dwyane Dee, M.D.     Microbiology: Recent Results (from the past 240 hour(s))  CULTURE, BLOOD (ROUTINE X 2)     Status: None   Collection Time    05/10/12  1:30 PM      Result Value Range Status   Specimen Description BLOOD RIGHT ARM   Final   Special Requests BOTTLES DRAWN AEROBIC AND ANAEROBIC 4CC   Final   Culture  Setup Time 05/10/2012 17:09   Final   Culture NO GROWTH 5 DAYS   Final   Report Status 05/16/2012 FINAL   Final  CULTURE, BLOOD (ROUTINE X 2)     Status: None   Collection Time    05/10/12  1:45 PM      Result Value Range Status   Specimen Description BLOOD RIGHT ARM   Final   Special Requests BOTTLES DRAWN AEROBIC AND ANAEROBIC [facility]   Final   Culture   Setup Time 05/10/2012 17:09   Final   Culture NO GROWTH 5 DAYS   Final   Report Status 05/16/2012 FINAL   Final  URINE CULTURE     Status: None   Collection Time    05/11/12  3:34 PM      Result Value Range Status   Specimen Description URINE, CLEAN CATCH   Final   Special Requests NONE   Final   Culture  Setup Time 05/12/2012 00:33   Final   Colony Count 5,000 COLONIES/ML   Final   Culture INSIGNIFICANT GROWTH   Final   Report Status 05/12/2012 FINAL   Final     Labs: Basic Metabolic Panel:  Recent Labs Lab 05/10/12 1330 05/11/12 0930 05/12/12 0515  NA 130* 135 135  K 4.7 4.2 3.8  CL 92* 99 101  CO2 26 24 24   GLUCOSE 165* 219* 202*  BUN 34* 27* 22  CREATININE 0.83 0.64 0.61  CALCIUM 10.1 8.6 8.6   Liver Function Tests:  Recent Labs Lab 05/10/12 1330 05/11/12 0930  AST 23 20  ALT 16 16  ALKPHOS 70 57  BILITOT 0.2* 0.2*  PROT 8.1 6.7  ALBUMIN 2.6* 2.2*   CBC:  Recent Labs Lab 05/10/12 1330 05/11/12 0930 05/12/12 0515 05/13/12 0445  WBC 18.6* 7.1 4.8 5.9  NEUTROABS 14.3*  --   --   --   HGB 5.8* 9.4* 9.3* 9.7*  HCT 18.1* 28.7* 28.6* 30.1*  MCV 89.6 87.2 87.2 88.3  PLT 565* 322 298 359   Cardiac Enzymes:  Recent Labs Lab 05/11/12 0045 05/11/12 0930  TROPONINI <0.30 <0.30   CBG:  Recent Labs Lab 05/15/12 1310 05/15/12 1710 05/15/12 2221 05/16/12 0737 05/16/12 1152  GLUCAP 93 70 109* 102* 160*       Signed:  GHERGHE, COSTIN  [facility] 05/16/2012, 3:48 PM

## 2012-05-16 NOTE — Progress Notes (Signed)
Patient seen and examined, denies any complaints. She had EGD which showed 2 antral ulcers which do not appear to be  Bleeding, Dr Matthias Hughs discussed with patient and her husband, and at this time they do not want colonoscopy. Patient gets the peg tube nutrition.  Filed Vitals:   05/16/12 0714  BP: 158/62  Pulse: 100  Temp: 97.5 F (36.4 C)  Resp: 12    Chest: Clear Bilaterally Heart : S1S2 RRR Abdomen: Soft, nontender Ext : No edema Neuro: Alert, oriented x 3  A/P Patient Active Problem List  Diagnosis  . Constipation  . Fever  . Anxiety and depression  . Dyslipidemia  . Diabetes mellitus  . Sinus tachycardia  . HCAP (healthcare-associated pneumonia)  . Normocytic anemia  . Hypokalemia  . Metabolic acidosis, normal anion gap (NAG)  . Hypocalcemia  . Hypomagnesemia  . Protein-calorie malnutrition, severe  . Encephalopathy, metabolic  . Elevated blood pressure  . HTN (hypertension)  . Focal seizure  . Encephalopathy  . Parkinson's disease  . Acute encephalopathy  . Hyponatremia  . Acute blood loss anemia  . Aspiration pneumonia   Patient has recurrent aspiration pneumonia and has required multiple hospitalizations, they do not want to pursue aggressive measures.and do not wabnt repeated  discussed  with patient and her husband Dorene Sorrow on phone, patient will benefit from home hospice. Both  The patient and her husband agree with hospice.  Called and discussed with PCP Dr Holley Bouche who agrees to be hospice attending if needed.  Meredeth Ide [facility] [phone]

## 2012-05-16 NOTE — Progress Notes (Signed)
Called husband and provided discharge education over the phone. Witnessed by [employer]. Will send education and prescriptions home with EMS who will be taking the patient home. Erskin Burnet RN

## 2012-05-16 NOTE — Progress Notes (Signed)
Notified by Marlow Baars, patient and family request services of [facility] of  [facility]) after discharge.  Patient information reviewed with Dr Elliot Gurney, [facility] Medical Director hospice eligible with dx: Pakinson's. Spoke with husband, Dorene Sorrow to initiate education related to hospice services, philosophy and team approach to care; he voiced good understanding; spoke with patient at bedside she was alert and oriented on this visit - Ms Facemire informed this Clinical research associate that she has aides in the home from 7:30-4:30 5 days /week -verified with care program staff that this is a 'CAPS' program and patient will be able to have hospice services with this program  Per patient's husband, he understands patient will be discharged today by non-emergent transport and their aide is at the home waiting on this- Mr Gierke will be able to take time off from work tomorrow, Thursday 05/17/12  to meet with the [facility] Admission visit RN  Between 2-2:30 pm  DME needs were discussed- currently patient has a hospital bed with an air mattress; and a hoyer lift -he does not feel they need any additional equipment at this time and is willing to discuss additional needs once [facility] staff comes to the home  Initial paperwork faxed to [facility]  Completed d/c summary will need to be faxed to [facility] @ [phone] when final Please notify [facility] when patient is ready to leave unit at d/c call [phone] (or [phone] if after 5 pm);  [facility] information and contact numbers left in patient's room to go home with patient's discharge paperwork Above information shared with [facility] Please call with any questions or concerns   Valente David, RN 05/16/2012, 1:57 PM [facility] Palliative Medicine Team RN Liaison [phone]

## 2012-05-23 NOTE — Discharge Summary (Signed)
Physician Discharge Summary  [name] ZOX:[id number] DOB: 03/11/1940 DOA: 04/28/2012  PCP: Johny Blamer, MD  Admit date: 04/28/2012 Discharge date: 05/04/2012  Time spent: 50 minutes  Recommendations for Outpatient Follow-up:  PCP IN1 WEEK  Discharge Diagnoses:  Principal Problem:   Acute encephalopathy Active Problems:   Fever   Diabetes mellitus   Sinus tachycardia   Parkinson's disease   Hyponatremia   Dementia   Dysphagia s/p PEG    Discharge Condition: stable  Diet recommendation: Tube feeds  Filed Weights   05/02/12 0604 05/03/12 0403 05/04/12 0500  Weight: 71.4 kg (157 lb 6.5 oz) 68.584 kg (151 lb 3.2 oz) 68.765 kg (151 lb 9.6 oz)    History of present illness:  History of from patient's husband at bedside.  72 year old female with history of cerebral palsy, Parkinson's disease, type 2 diabetes mellitus, renal cancer, dysphagia with history of recurrent aspiration pneumonia, status post PEG tube placed for glucose admitted recently for almost 2 weeks portal mental status with Hospital course prolonged with health care associated pneumonia and focal seizures. She was started on Dilantin and Keppra evaluated by neurology. She was discharged home with home services Only yesterday. As per husband at bedside he gave agent her medication this morning and shortly thereafter she was poorly responsive and very lethargic. EMS arrived and was found to be responding to sternal rubs and brought to the hospital   Hospital Course:  1. Metabolic Encephalopathy  - Likely secondary to UTI, underlying parkinson's disease and medications - mentation Clinically improved with treatment of UTI, per husband mentation is at baseline now  2. Possible UTI  -Urinalysis with yeast, Fevers improved after diflucan administration reportedly  -de-escalated antibiotics, Initially was treated with IV cefepime and Vanc, this was changed to Po levaquin for 3 more days  -Repeated blood  cultures negative   3.Recent HCAP  -s/p full course of abx  -CXR more c/w atx, will add incentive spirometry   4. Diabetes Mellitus-  - Continue  levemir, SSI   5. Sinus Tachycardia-  -resumed home toprol  -resolved  6. Parkinson's Disease  - stable.   7. Hyponatremia  -Resolved with hydration   8. Dehydration  - resolved   9.Seizure disorder  -continue keppra and dilantin change to suspension per pharmacist discussion   I discussed poor prognosis with husband and need for palliative services at some point, he declined this now and also declined SNF  Discharge Exam: Filed Vitals:   05/04/12 0624 05/04/12 0800 05/04/12 0959 05/04/12 1417  BP: 154/75 148/55 129/81 144/76  Pulse: 88 94 96 85  Temp: 98.4 F (36.9 C) 99.4 F (37.4 C) 98.4 F (36.9 C) 98.6 F (37 C)  TempSrc: Oral Oral Oral Oral  Resp: 16 22 18 20   Height:      Weight:      SpO2: 97% 100% 97% 100%    General: alert, awake, oriented to self and place Cardiovascular: S1S2/RRR Respiratory: CTAB  Discharge Instructions  Discharge Orders   Future Orders Complete By Expires     Discharge instructions  As directed     Comments:      Skin care to prevent breakdown    Increase activity slowly  As directed         Medication List    STOP taking these medications       levETIRAcetam 1000 MG tablet  Commonly known as:  KEPPRA     phenytoin 100 MG ER capsule  Commonly known as:  DILANTIN     ranitidine 300 MG tablet  Commonly known as:  ZANTAC      TAKE these medications       antiseptic oral rinse Liqd  15 mLs by Mouth Rinse route 2 (two) times daily.     clonazePAM 0.5 MG tablet  Commonly known as:  KLONOPIN  Place 1 tablet (0.5 mg total) into feeding tube at bedtime as needed for anxiety.     diphenoxylate-atropine 2.5-0.025 MG per tablet  Commonly known as:  LOMOTIL  Place 1 tablet into feeding tube 3 (three) times daily as needed. For diarrhea     feeding supplement (GLUCERNA  1.2 CAL) Liqd  Place 237 mLs into feeding tube 5 (five) times daily.     fenofibrate 145 MG tablet  Commonly known as:  TRICOR  Place 145 mg into feeding tube at bedtime.     HYDROcodone-acetaminophen 5-325 MG per tablet  Commonly known as:  NORCO/VICODIN  Place 1 tablet into feeding tube 2 (two) times daily as needed for pain.     hydrocortisone cream 1 %  Apply 1 application topically 3 (three) times daily as needed (for rash on neck/back.).     insulin aspart 100 UNIT/ML injection  Commonly known as:  novoLOG  Inject 0-15 Units into the skin every 4 (four) hours. Before each meal 3 times a day, 140-199 - 2 units, 200-250 - 4 units, 251-299 - 6 units,  300-349 - 8 units,  350 or above 10 units.     insulin detemir 100 UNIT/ML injection  Commonly known as:  LEVEMIR  Inject 0.4 mLs (40 Units total) into the skin 2 (two) times daily.     metFORMIN 1000 MG tablet  Commonly known as:  GLUCOPHAGE  Place 1,000 mg into feeding tube 2 (two) times daily with a meal.     metoprolol 100 MG tablet  Commonly known as:  LOPRESSOR  Place 1 tablet (100 mg total) into feeding tube 2 (two) times daily.     nystatin 100000 UNIT/GM Powd  Apply 1 g topically 3 (three) times daily as needed (for groin rash.).     PARoxetine 40 MG tablet  Commonly known as:  PAXIL  Place 40 mg into feeding tube every morning.     phenytoin 125 MG/5ML suspension  Commonly known as:  DILANTIN  Place 8 mLs (200 mg total) into feeding tube 2 (two) times daily.     pregabalin 150 MG capsule  Commonly known as:  LYRICA  Place 150 mg into feeding tube 2 (two) times daily.           Follow-up Information   Follow up with PCP In 1 week.       The results of significant diagnostics from this hospitalization (including imaging, microbiology, ancillary and laboratory) are listed below for reference.    Significant Diagnostic Studies: Dg Chest 2 View  05/10/2012  *RADIOLOGY REPORT*  Clinical Data: Fever.   Renal cancer  CHEST - 2 VIEW  Comparison: 04/28/2012  Findings: Improvement in bibasilar airspace disease compared with the prior study.  There remains elevation of the right hemidiaphragm, and mild atelectasis in the lung bases.  Small left effusion is improved.  There is no heart failure or edema.  IMPRESSION: Improvement in bibasilar airspace disease and small left effusion. Negative for heart failure.   Original Report Authenticated By: Janeece Riggers, M.D.    Ct Chest Wo Contrast  05/11/2012  *RADIOLOGY REPORT*  Clinical Data: Basilar infiltrates.  Aspiration pneumonia.  Altered mental status.  CT CHEST WITHOUT CONTRAST  Technique:  Multidetector CT imaging of the chest was performed following the standard protocol without IV contrast.  Comparison: None.  Findings: Right-sided central venous catheter with tip in the superior vena cava.  Mild cardiac enlargement with small pericardial effusion.  Normal caliber thoracic aorta.  Mediastinal lymph nodes are present without pathologic enlargement.  Small right pleural effusion with atelectasis or infiltration in the right lung base.  Mild atelectasis or infiltration in the left lung base.  Changes are nonspecific but would be compatible with aspiration in the appropriate clinical setting.  Airways are patent.  No pneumothorax.  Degenerative changes in the thoracic spine. Nonspecific area of low attenuation in the medial segment left lobe of the liver which could represent focal fatty infiltration.  Similar appearance as demonstrated on previous CT abdomen and pelvis from 03/03/2012.  IMPRESSION: Right pleural effusion with bilateral basilar atelectasis or infiltration.  Small pericardial effusion.   Original Report Authenticated By: Burman Nieves, M.D.    Dg Chest Port 1 View  05/10/2012  *RADIOLOGY REPORT*  Clinical Data: Line placement  PORTABLE CHEST - 1 VIEW  Comparison: 1444 hours  Findings: Right upper extremity PICC tip in the mid SVC.  Right pleural  effusion worse.  Left lung is clear.  Normal heart size. No pneumothorax. Left pleural effusion improved.  Left basilar atelectasis improved.  IMPRESSION: Right PICC tip at the SVC.  Worsening right effusion.  Left pleural effusion and basilar atelectasis improved.   Original Report Authenticated By: Jolaine Click, M.D.    Dg Chest Port 1 View  04/28/2012  *RADIOLOGY REPORT*  Clinical Data: Short of breath  PORTABLE CHEST - 1 VIEW  Comparison: 04/22/2012  Findings: Progression of bibasilar airspace disease which is most consistent with atelectasis.  There is decreased lung volume. Negative for heart failure or edema.  Small left effusion.  IMPRESSION: Increased bibasilar airspace disease, most consistent with atelectasis.  Small left effusion has developed.   Original Report Authenticated By: Janeece Riggers, M.D.     Microbiology: No results found for this or any previous visit (from the past 240 hour(s)).   Labs: Basic Metabolic Panel: No results found for this basename: NA, K, CL, CO2, GLUCOSE, BUN, CREATININE, CALCIUM, MG, PHOS,  in the last 168 hours Liver Function Tests: No results found for this basename: AST, ALT, ALKPHOS, BILITOT, PROT, ALBUMIN,  in the last 168 hours No results found for this basename: LIPASE, AMYLASE,  in the last 168 hours No results found for this basename: AMMONIA,  in the last 168 hours CBC: No results found for this basename: WBC, NEUTROABS, HGB, HCT, MCV, PLT,  in the last 168 hours Cardiac Enzymes: No results found for this basename: CKTOTAL, CKMB, CKMBINDEX, TROPONINI,  in the last 168 hours BNP: BNP (last 3 results) No results found for this basename: PROBNP,  in the last 8760 hours CBG: No results found for this basename: GLUCAP,  in the last 168 hours     Signed:  Mychaela Lennartz  [facility] 05/23/2012, 9:45 PM

## 2012-08-13 ENCOUNTER — Other Ambulatory Visit: Payer: Self-pay | Admitting: Family Medicine

## 2012-09-07 ENCOUNTER — Encounter (HOSPITAL_COMMUNITY): Payer: Self-pay | Admitting: Emergency Medicine

## 2012-09-07 ENCOUNTER — Emergency Department (HOSPITAL_COMMUNITY): Payer: 59

## 2012-09-07 ENCOUNTER — Emergency Department (HOSPITAL_COMMUNITY)
Admission: EM | Admit: 2012-09-07 | Discharge: 2012-09-07 | Disposition: A | Discharge status: Home / Self Care | Payer: 59 | Attending: Emergency Medicine | Admitting: Emergency Medicine

## 2012-09-07 DIAGNOSIS — R110 Nausea: Secondary | ICD-10-CM | POA: Insufficient documentation

## 2012-09-07 DIAGNOSIS — G20A1 Parkinson's disease without dyskinesia, without mention of fluctuations: Secondary | ICD-10-CM | POA: Insufficient documentation

## 2012-09-07 DIAGNOSIS — G8250 Quadriplegia, unspecified: Secondary | ICD-10-CM | POA: Insufficient documentation

## 2012-09-07 DIAGNOSIS — Z87440 Personal history of urinary (tract) infections: Secondary | ICD-10-CM | POA: Insufficient documentation

## 2012-09-07 DIAGNOSIS — Z87448 Personal history of other diseases of urinary system: Secondary | ICD-10-CM | POA: Insufficient documentation

## 2012-09-07 DIAGNOSIS — Z85528 Personal history of other malignant neoplasm of kidney: Secondary | ICD-10-CM | POA: Insufficient documentation

## 2012-09-07 DIAGNOSIS — Z8701 Personal history of pneumonia (recurrent): Secondary | ICD-10-CM | POA: Insufficient documentation

## 2012-09-07 DIAGNOSIS — Z79899 Other long term (current) drug therapy: Secondary | ICD-10-CM | POA: Insufficient documentation

## 2012-09-07 DIAGNOSIS — G809 Cerebral palsy, unspecified: Secondary | ICD-10-CM | POA: Insufficient documentation

## 2012-09-07 DIAGNOSIS — G20 Parkinson's disease: Secondary | ICD-10-CM | POA: Insufficient documentation

## 2012-09-07 DIAGNOSIS — E119 Type 2 diabetes mellitus without complications: Secondary | ICD-10-CM | POA: Insufficient documentation

## 2012-09-07 DIAGNOSIS — R109 Unspecified abdominal pain: Secondary | ICD-10-CM | POA: Insufficient documentation

## 2012-09-07 DIAGNOSIS — R000 Tachycardia, unspecified: Secondary | ICD-10-CM | POA: Insufficient documentation

## 2012-09-07 DIAGNOSIS — Z794 Long term (current) use of insulin: Secondary | ICD-10-CM | POA: Insufficient documentation

## 2012-09-07 HISTORY — DX: Encounter for palliative care: Z51.5

## 2012-09-07 LAB — URINALYSIS, ROUTINE W REFLEX MICROSCOPIC
Bilirubin Urine: NEGATIVE
Ketones, ur: NEGATIVE mg/dL
Nitrite: NEGATIVE
Specific Gravity, Urine: 1.014 (ref 1.005–1.030)
Urobilinogen, UA: 1 mg/dL (ref 0.0–1.0)
pH: 7.5 (ref 5.0–8.0)

## 2012-09-07 LAB — CBC WITH DIFFERENTIAL/PLATELET
Basophils Relative: 0 % (ref 0–1)
Eosinophils Absolute: 0.1 10*3/uL (ref 0.0–0.7)
Eosinophils Relative: 1 % (ref 0–5)
HCT: 32.7 % — ABNORMAL LOW (ref 36.0–46.0)
Hemoglobin: 11.1 g/dL — ABNORMAL LOW (ref 12.0–15.0)
Lymphs Abs: 2.7 10*3/uL (ref 0.7–4.0)
MCH: 30.9 pg (ref 26.0–34.0)
MCHC: 33.9 g/dL (ref 30.0–36.0)
MCV: 91.1 fL (ref 78.0–100.0)
Monocytes Absolute: 0.6 10*3/uL (ref 0.1–1.0)
Monocytes Relative: 7 % (ref 3–12)
RBC: 3.59 MIL/uL — ABNORMAL LOW (ref 3.87–5.11)

## 2012-09-07 LAB — COMPREHENSIVE METABOLIC PANEL
Albumin: 3 g/dL — ABNORMAL LOW (ref 3.5–5.2)
Alkaline Phosphatase: 85 U/L (ref 39–117)
BUN: 23 mg/dL (ref 6–23)
Creatinine, Ser: 0.47 mg/dL — ABNORMAL LOW (ref 0.50–1.10)
GFR calc Af Amer: >90 mL/min (ref >90)
Glucose, Bld: 191 mg/dL — ABNORMAL HIGH (ref 70–99)
Potassium: 4.2 mEq/L (ref 3.5–5.1)
Total Protein: 7.6 g/dL (ref 6.0–8.3)

## 2012-09-07 LAB — LIPASE, BLOOD: Lipase: 93 U/L — ABNORMAL HIGH (ref 11–59)

## 2012-09-07 LAB — URINE MICROSCOPIC-ADD ON

## 2012-09-07 MED ORDER — SODIUM CHLORIDE 0.9 % IV SOLN
1000.0000 mL | INTRAVENOUS | Status: DC
  Administered 2012-09-07: 1000 mL via INTRAVENOUS

## 2012-09-07 MED ORDER — ONDANSETRON HCL 4 MG/2ML IJ SOLN
4.0000 mg | Freq: Once | INTRAMUSCULAR | Status: AC
  Administered 2012-09-07: 4 mg via INTRAVENOUS
  Filled 2012-09-07: qty 2

## 2012-09-07 MED ORDER — HYDROCODONE-ACETAMINOPHEN 5-325 MG PO TABS: 1.0000 | ORAL_TABLET | Freq: Three times a day (TID) | ORAL | Status: DC

## 2012-09-07 NOTE — ED Notes (Addendum)
Per [facility], pt was to be brought in for a CT of her abdomen per pt home health nurse. Pt reports abdominal pain for the past 5 months that worsens when she eats through her feeding tube. Pt has foley catheter, feeding tube, and pt is paraplegic. Pt has left below elbow amputated. Pt has a DNR.

## 2012-09-07 NOTE — ED Notes (Signed)
YNW:[id number]> Expected date:<BR> Expected time:<BR> Means of arrival:<BR> Comments:<BR> EMS-abdominal pain

## 2012-09-07 NOTE — ED Provider Notes (Signed)
CSN: [id number]     Arrival date & time 09/07/12  1219 History     First MD Initiated Contact with Patient 09/07/12 1234     Chief Complaint  Patient presents with  . Abdominal Pain   (Consider location/radiation/quality/duration/timing/severity/associated sxs/prior Treatment) HPI  Patient presents with concern of abdominal pain. She has had some pain for greater than 5 months, but it has become worse over the past few days. The pain is diffuse, with episodes of severity that seem to correlate with nutrition intake via feeding tube. However, there episodes are not associated with by mouth intake. She also complains of ongoing nausea, but there has been no vomiting, no diarrhea. Patient has been feeding tube dependent for a long time.  She has multiple other medical problems, state that these are essentially unchanged. Patient is quadriplegic, has Parkinson, CP , renal dysfunction No clear alleviating or exacerbating factors beyond nutrition intake. Data patient had an of visit.  Home health nurse the patient had increased pain and she referred her here for evaluation.  Past Medical History  Diagnosis Date  . Diabetes mellitus   . Renal disorder   . CP (cerebral palsy)   . Parkinsonism   . Dysphagia   . Recurrent aspiration pneumonia   . Bladder spasms   . Amputation of arm   . Recurrent UTI   . Renal cancer     s/p nephrectomy   Past Surgical History  Procedure Laterality Date  . 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 Peg tube placement    . Knee surgery    . Nephrectomy    . Esophagogastroduodenoscopy N/A 05/15/2012    Procedure: ESOPHAGOGASTRODUODENOSCOPY (EGD);  Surgeon: Florencia Reasons, MD;  Location: [facility];  Service: Endoscopy;  Laterality: N/A;  Can do with standard sedation if MAC unavailable   Family History  Problem Relation Age of Onset  . CAD Mother   . CAD Mother    History  Substance Use Topics  . Smoking status: Never Smoker   .  Smokeless tobacco: Never Used  . Alcohol Use: No   OB History   Grav Para Term Preterm Abortions TAB SAB Ect Mult Living                 Review of Systems  Constitutional:       Per HPI, otherwise negative  HENT:       Per HPI, otherwise negative  Respiratory:       Per HPI, otherwise negative  Cardiovascular:       Per HPI, otherwise negative  Gastrointestinal: Positive for nausea and abdominal pain. Negative for vomiting and diarrhea.  Endocrine:       Negative aside from HPI  Genitourinary:       Neg aside from HPI   Musculoskeletal:       Per HPI, otherwise negative  Skin: Negative.   Neurological: Negative for syncope.    Allergies  Codeine and Morphine and related  Home Medications   Current Outpatient Rx  Name  Route  Sig  Dispense  Refill  . clonazePAM (KLONOPIN) 1 MG tablet   Oral   Take 2 mg by mouth 2 (two) times daily.         . fenofibrate (TRICOR) 145 MG tablet   Per Tube   Place 145 mg into feeding tube at bedtime.          . hydrALAZINE (APRESOLINE) 25 MG  tablet   Per Tube   Place 1 tablet (25 mg total) into feeding tube 3 (three) times daily.   60 tablet   0   . HYDROcodone-acetaminophen (NORCO/VICODIN) 5-325 MG per tablet   Per Tube   Place 1 tablet into feeding tube 2 (two) times daily as needed for pain.   30 tablet   0   . levETIRAcetam (KEPPRA) 1000 MG tablet   Feeding Tube   1,000 mg by Feeding Tube route every 12 (twelve) hours.         . metFORMIN (GLUCOPHAGE) 1000 MG tablet   Per Tube   Place 1,000 mg into feeding tube 2 (two) times daily with a meal.         . PARoxetine (PAXIL) 40 MG tablet   Per Tube   Place 40 mg into feeding tube every morning.          . phenytoin (DILANTIN) 100 MG ER capsule   Oral   Take 200 mg by mouth 2 (two) times daily.         . pregabalin (LYRICA) 150 MG capsule   Per Tube   Place 150 mg into feeding tube 2 (two) times daily.          . ranitidine (ZANTAC) 300 MG tablet    Per Tube   Place 1 tablet (300 mg total) into feeding tube at bedtime.   30 tablet   1   . antiseptic oral rinse (BIOTENE) LIQD   Mouth Rinse   15 mLs by Mouth Rinse route 2 (two) times daily.         . diphenoxylate-atropine (LOMOTIL) 2.5-0.025 MG per tablet   Per Tube   Place 1 tablet into feeding tube 3 (three) times daily as needed. For diarrhea         . hydrocortisone cream 1 %   Topical   Apply 1 application topically 3 (three) times daily as needed (for rash on neck/back.).         Marland Kitchen insulin aspart (NOVOLOG) 100 UNIT/ML injection   Subcutaneous   Inject 0-15 Units into the skin every 4 (four) hours. Before each meal 3 times a day, 140-199 - 2 units, 200-250 - 4 units, 251-299 - 6 units,  300-349 - 8 units,  350 or above 10 units.   1 vial   0   . insulin detemir (LEVEMIR) 100 UNIT/ML injection   Subcutaneous   Inject 0.4 mLs (40 Units total) into the skin 2 (two) times daily.   10 mL   1   . metoprolol tartrate (LOPRESSOR) 100 MG tablet   Per Tube   Place 1 tablet (100 mg total) into feeding tube 2 (two) times daily.   60 tablet   0   . Multiple Vitamin (MULTIVITAMIN WITH MINERALS) TABS   Oral   Take 1 tablet by mouth every morning.         . Nutritional Supplements (FEEDING SUPPLEMENT, GLUCERNA 1.2 CAL,) LIQD   Per Tube   Place 237 mLs into feeding tube 5 (five) times daily.   237 mL   0     Please dispense enough for 1 month supply at above ...   . nystatin (MYCOSTATIN/NYSTOP) 100000 UNIT/GM POWD   Topical   Apply 1 g topically 3 (three) times daily as needed (for groin rash.).          SpO2 96% Physical Exam  Nursing note and vitals reviewed. Constitutional: She is oriented to  person, place, and time.  Unwell-appearing elderly, obese, female  HENT:  Head: Normocephalic and atraumatic.  Eyes: Conjunctivae are normal. Right eye exhibits no discharge. Left eye exhibits no discharge.  Neck: No tracheal deviation present.  Cardiovascular:  Regular rhythm.  Tachycardia present.   Pulmonary/Chest: Effort normal. No stridor. No respiratory distress.  Abdominal:  Mild tenderness to palpation with no peritonitis  Musculoskeletal:  No gross deformity  Neurological: She is alert and oriented to person, place, and time.  Quadriplegia  Skin:  Skin is pale.  Skin surrounding the G-tube site is slightly erythematous, without drainage, without fluctuance the  Psychiatric: She has a normal mood and affect.    ED Course   Procedures (including critical care time)  Labs Reviewed  CBC WITH DIFFERENTIAL  COMPREHENSIVE METABOLIC PANEL  LIPASE, BLOOD  URINALYSIS, ROUTINE W REFLEX MICROSCOPIC   No results found. No diagnosis found. Pulse oximetry 99% remainder normal   2:52 PM Patient remains in similar condition.  No new complaints.  I discussed all results with her.  She will follow up with her physicians next week. MDM  This patient with multiple medical issues, including CT, quadriplegia, feeding tube dependence now presents with worsening pain in her abdomen.  Notably, the patient has chronic abdominal pain.  With some tenderness on palpation, the patient's CT scan, labs.  These were largely reassuring, though there is mild elevation of the patient's lipase.  Patient was discharged in stable condition with outpatient followup, return precautions.  Gerhard Munch, MD 09/07/12 [phone]

## 2012-09-08 LAB — URINE CULTURE: Colony Count: >=100000

## 2012-11-19 ENCOUNTER — Other Ambulatory Visit (HOSPITAL_COMMUNITY): Payer: Self-pay | Admitting: Family Medicine

## 2012-11-19 DIAGNOSIS — R633 Feeding difficulties: Secondary | ICD-10-CM

## 2012-11-20 ENCOUNTER — Ambulatory Visit (HOSPITAL_COMMUNITY)
Admission: RE | Admit: 2012-11-20 | Discharge: 2012-11-20 | Disposition: A | Discharge status: Home / Self Care | Payer: 59 | Source: Ambulatory Visit | Attending: Family Medicine | Admitting: Family Medicine

## 2012-11-20 DIAGNOSIS — R633 Feeding difficulties, unspecified: Secondary | ICD-10-CM | POA: Insufficient documentation

## 2012-11-20 DIAGNOSIS — Z431 Encounter for attention to gastrostomy: Secondary | ICD-10-CM | POA: Insufficient documentation

## 2012-11-20 MED ORDER — IOHEXOL 300 MG/ML  SOLN
50.0000 mL | Freq: Once | INTRAMUSCULAR | Status: AC | PRN
  Administered 2012-11-20: 5 mL

## 2012-11-21 ENCOUNTER — Telehealth (HOSPITAL_COMMUNITY): Payer: Self-pay | Admitting: *Deleted

## 2012-11-21 NOTE — Telephone Encounter (Signed)
Post procedure follow up call.  Spoke with pt.  She says she is doing well without problems or complications.  Says care received was good

## 2013-04-03 ENCOUNTER — Encounter (HOSPITAL_COMMUNITY): Payer: Self-pay | Admitting: Emergency Medicine

## 2013-04-03 ENCOUNTER — Emergency Department (HOSPITAL_COMMUNITY)
Admission: EM | Admit: 2013-04-03 | Discharge: 2013-04-03 | Disposition: A | Discharge status: Home / Self Care | Payer: 59 | Attending: Emergency Medicine | Admitting: Emergency Medicine

## 2013-04-03 ENCOUNTER — Emergency Department (HOSPITAL_COMMUNITY): Payer: 59

## 2013-04-03 DIAGNOSIS — Z87440 Personal history of urinary (tract) infections: Secondary | ICD-10-CM | POA: Diagnosis not present

## 2013-04-03 DIAGNOSIS — S48919A Complete traumatic amputation of unspecified shoulder and upper arm, level unspecified, initial encounter: Secondary | ICD-10-CM | POA: Insufficient documentation

## 2013-04-03 DIAGNOSIS — Z8701 Personal history of pneumonia (recurrent): Secondary | ICD-10-CM | POA: Insufficient documentation

## 2013-04-03 DIAGNOSIS — Z85528 Personal history of other malignant neoplasm of kidney: Secondary | ICD-10-CM | POA: Diagnosis not present

## 2013-04-03 DIAGNOSIS — G20 Parkinson's disease: Secondary | ICD-10-CM | POA: Diagnosis not present

## 2013-04-03 DIAGNOSIS — K9423 Gastrostomy malfunction: Secondary | ICD-10-CM | POA: Diagnosis present

## 2013-04-03 DIAGNOSIS — Z9889 Other specified postprocedural states: Secondary | ICD-10-CM | POA: Insufficient documentation

## 2013-04-03 DIAGNOSIS — Z79899 Other long term (current) drug therapy: Secondary | ICD-10-CM | POA: Diagnosis not present

## 2013-04-03 DIAGNOSIS — G20A1 Parkinson's disease without dyskinesia, without mention of fluctuations: Secondary | ICD-10-CM | POA: Insufficient documentation

## 2013-04-03 DIAGNOSIS — IMO0002 Reserved for concepts with insufficient information to code with codable children: Secondary | ICD-10-CM | POA: Insufficient documentation

## 2013-04-03 DIAGNOSIS — Z87448 Personal history of other diseases of urinary system: Secondary | ICD-10-CM | POA: Diagnosis not present

## 2013-04-03 DIAGNOSIS — E119 Type 2 diabetes mellitus without complications: Secondary | ICD-10-CM | POA: Insufficient documentation

## 2013-04-03 DIAGNOSIS — Z905 Acquired absence of kidney: Secondary | ICD-10-CM | POA: Insufficient documentation

## 2013-04-03 LAB — CBG MONITORING, ED: GLUCOSE-CAPILLARY: 142 mg/dL — AB (ref 70–99)

## 2013-04-03 MED ORDER — IOHEXOL 300 MG/ML  SOLN
50.0000 mL | Freq: Once | INTRAMUSCULAR | Status: AC | PRN
  Administered 2013-04-03: 50 mL via ORAL

## 2013-04-03 NOTE — ED Provider Notes (Signed)
CSN: [id number]     Arrival date & time 04/03/13  1514 History   First MD Initiated Contact with Patient 04/03/13 1650     Chief Complaint  Patient presents with  . PEG tube out      (Consider location/radiation/quality/duration/timing/severity/associated sxs/prior Treatment) HPI Comments: Pt states he PEG tube accidentally came out around 3pm today. She denies other complaints including ab pain, n/v, d/a, urinary symptoms SOB. She had been tolerating tube feeds well.  Tube out since at least 3pm, was accidental. She has had PEG for 2 years due to difficulty swallowing.    Past Medical History  Diagnosis Date  . Diabetes mellitus   . Renal disorder   . CP (cerebral palsy)   . Parkinsonism   . Dysphagia   . Recurrent aspiration pneumonia   . Bladder spasms   . Amputation of arm   . Recurrent UTI   . Renal cancer     s/p nephrectomy  . Hospice care patient    Past Surgical History  Procedure Laterality Date  . 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 Peg tube placement    . Knee surgery    . Nephrectomy    . Esophagogastroduodenoscopy N/A 05/15/2012    Procedure: ESOPHAGOGASTRODUODENOSCOPY (EGD);  Surgeon: Cleotis Nipper, MD;  Location: [facility];  Service: Endoscopy;  Laterality: N/A;  Can do with standard sedation if MAC unavailable   Family History  Problem Relation Age of Onset  . CAD Mother   . CAD Mother    History  Substance Use Topics  . Smoking status: Never Smoker   . Smokeless tobacco: Never Used  . Alcohol Use: No   OB History   Grav Para Term Preterm Abortions TAB SAB Ect Mult Living                 Review of Systems  Constitutional: Negative for fever, chills, diaphoresis, activity change, appetite change and fatigue.  HENT: Negative for congestion, facial swelling, rhinorrhea and sore throat.   Eyes: Negative for photophobia and discharge.  Respiratory: Negative for cough, chest tightness and shortness of breath.   Cardiovascular:  Negative for chest pain, palpitations and leg swelling.  Gastrointestinal: Negative for nausea, vomiting, abdominal pain and diarrhea.  Endocrine: Negative for polydipsia and polyuria.  Genitourinary: Negative for dysuria, frequency, difficulty urinating and pelvic pain.  Musculoskeletal: Negative for arthralgias, back pain, neck pain and neck stiffness.  Skin: Negative for color change and wound.  Allergic/Immunologic: Negative for immunocompromised state.  Neurological: Negative for facial asymmetry, weakness, numbness and headaches.  Hematological: Does not bruise/bleed easily.  Psychiatric/Behavioral: Negative for confusion and agitation.      Allergies  Codeine and Morphine and related  Home Medications   Current Outpatient Rx  Name  Route  Sig  Dispense  Refill  . antiseptic oral rinse (BIOTENE) LIQD   Mouth Rinse   15 mLs by Mouth Rinse route 2 (two) times daily.         . bisacodyl (DULCOLAX) 10 MG suppository   Rectal   Place 10-20 mg rectally as needed for constipation.         . bisacodyl (GENTLE LAXATIVE) 5 MG EC tablet   Oral   Take 5-10 mg by mouth daily as needed for constipation.         . clonazePAM (KLONOPIN) 1 MG tablet   Oral   Take 1 mg by mouth 2 (two) times daily.          Marland Kitchen  diphenoxylate-atropine (LOMOTIL) 2.5-0.025 MG per tablet   Per Tube   Place 1 tablet into feeding tube 2 (two) times daily as needed for diarrhea or loose stools. For diarrhea         . fenofibrate (TRICOR) 145 MG tablet   Per Tube   Place 145 mg into feeding tube at bedtime.          . hydrALAZINE (APRESOLINE) 25 MG tablet   Per Tube   Place 1 tablet (25 mg total) into feeding tube 3 (three) times daily.   60 tablet   0   . HYDROcodone-acetaminophen (NORCO/VICODIN) 5-325 MG per tablet   Oral   Take 1 tablet by mouth 3 (three) times daily as needed for moderate pain.         . hydrocortisone cream 1 %   Topical   Apply 1 application topically 3 (three)  times daily as needed (for rash on neck/back.).         Marland Kitchen levETIRAcetam (KEPPRA) 1000 MG tablet   Feeding Tube   1,000 mg by Feeding Tube route every 12 (twelve) hours.         . metFORMIN (GLUCOPHAGE) 1000 MG tablet   Per Tube   Place 1,000 mg into feeding tube 2 (two) times daily with a meal.         . metroNIDAZOLE (FLAGYL) 500 MG tablet   Oral   Take by mouth. Crush 1 tab (500mg ) and sprinkle into open wound as needed.         . Nutritional Supplements (FEEDING SUPPLEMENT, GLUCERNA 1.2 CAL,) LIQD   Per Tube   Place 237 mLs into feeding tube 4 (four) times daily.         Marland Kitchen PARoxetine (PAXIL) 40 MG tablet   Per Tube   Place 40 mg into feeding tube every morning.          . phenytoin (DILANTIN) 125 MG/5ML suspension   Oral   Take 200 mg by mouth 2 (two) times daily.         . pregabalin (LYRICA) 150 MG capsule   Per Tube   Place 150 mg into feeding tube 2 (two) times daily.           BP 123/52  Pulse 95  Temp(Src) 98.4 F (36.9 C) (Oral)  Resp 16  SpO2 96% Physical Exam  Constitutional: She is oriented to person, place, and time. She appears well-developed and well-nourished. No distress.  HENT:  Head: Normocephalic and atraumatic.  Mouth/Throat: No oropharyngeal exudate.  Eyes: Pupils are equal, round, and reactive to light.  Neck: Normal range of motion. Neck supple.  Cardiovascular: Normal rate, regular rhythm and normal heart sounds.  Exam reveals no gallop and no friction rub.   No murmur heard. Pulmonary/Chest: Effort normal and breath sounds normal. No respiratory distress. She has no wheezes. She has no rales.  Abdominal: Soft. Bowel sounds are normal. She exhibits no distension and no mass. There is no tenderness. There is no rebound and no guarding.    Musculoskeletal: Normal range of motion. She exhibits no edema and no tenderness.  Atrophy of BLLE  Neurological: She is alert and oriented to person, place, and time.  Skin: Skin is warm  and dry.  Psychiatric: She has a normal mood and affect.    ED Course  Procedures (including critical care time) Labs Review Labs Reviewed  CBG MONITORING, ED - Abnormal; Notable for the following:    Glucose-Capillary 142 (*)  All other components within normal limits   Imaging Review Dg Abd 1 View  04/03/2013   CLINICAL DATA:  Repositioned gastrostomy tube.  EXAM: ABDOMEN - 1 VIEW  COMPARISON:  11/20/2012  FINDINGS: 50 mm of Omnipaque 300 was injected into the percutaneous gastrostomy tube. Contrast fills the stomach and extends into the duodenum and proximal small bowel. There is no contrast extravasation. The tube is well positioned.  There are stable surgical vascular clips superimpose adjacent to the greater curvature of the stomach.  IMPRESSION: Percutaneous gastrostomy tube is well positioned and ready for use.   Electronically Signed   By: Lajean Manes M.D.   On: 04/03/2013 19:12    EKG Interpretation   None       MDM   Final diagnoses:  PEG tube malfunction    Pt is a 73 y.o. female with Pmhx as above who presents with having accidentally pulled out her PEG tube around 3pm.  Denies any other complaints and says she has been feeling well.  Pt's PEG replaced with 30f PEG after unable to place 10f PEG.  Placement confirmed with KUB.  Will d/c home and plan for her to call PCP tomorrow and to call her surgeon to discuss tube size. Return precautions given for new or worsening symptoms including pain w/ use, displacement.    Per pharmacist, husband reported to pharmacy that he levimir 55u BID and novolog SS for several months as her glucose has been "fine" and also that she had a wound on the back of her leg which had not been improved by flagyl. FSBG here 142.  She has R sided decubitus ulcer, at least stage 3, but with no signs of overlying infection. Pt can f/u with PCP for this issue.   Neta Ehlers, MD 04/03/13 [phone]

## 2013-04-03 NOTE — ED Notes (Signed)
MD at bedside. 

## 2013-04-03 NOTE — ED Notes (Signed)
Waiting for supply peg tube from endo

## 2013-04-03 NOTE — ED Notes (Signed)
Bed: WA24 Expected date:  Expected time:  Means of arrival:  Comments: EMS 

## 2013-04-03 NOTE — Discharge Instructions (Signed)
Gastric Tube Replacement You are having your gastric tube (the tube that goes into the stomach) changed. This is usually a very minor procedure. If medications are prescribed, take them as directed. Only take over-the-counter or prescription medications for pain, discomfort, or fever as directed by your caregiver.  SEEK IMMEDIATE MEDICAL CARE IF:   You develop chills, fever, or show signs of generalized illness.  You develop bleeding around the tube.  Your new tube does not seem to be working properly.  You are unable to get feedings into the tube.  Your tube comes out for any reason. Document Released: 10/19/2000 Document Revised: 04/18/2011 Document Reviewed: 01/24/2005 [facility] Patient Information 2014 [place].  PEG, Home Care You had a percutaneous endoscopic gastrostomy (PEG) tube put in your stomach. Do not put anything in your feeding tube until you have been shown how to use it. HOME CARE For the first 24 hours:  Do not sign any important or legal papers.  Do not drive. Every day:  Check the place where the tube goes into your belly. See your doctor if the tube site is red, puffy (swollen), or smells bad.  Clean around the tube with soap and water. Dry the area by patting it gently with a clean towel.  Do not put a bandage around the tube site. Keep the area dry.  You should turn the bumper on the outside of your belly. The bumper should lie flat against your skin so you are comfortable.  Do not put whole pills through the tube. Instead, crush and dissolve all pills in warm water before placing down tube. GET HELP RIGHT AWAY IF:  You throw up (vomit).  The pain in your belly gets worse.  You have trouble breathing.  You have a temperature by mouth above 102 F (38.9 C), not controlled by medicine.  You have pain when you swallow.  You have chest pain.  There is green or yellow fluid (drainage) around the tube site that smells bad.  There is redness  and puffiness around the tube site.  The tube comes out. MAKE SURE YOU:  Understand these instructions.  Will watch your condition.  Will get help right away if you are not doing well or get worse. Document Released: 02/26/2010 Document Revised: 09/26/2012 Document Reviewed: 07/26/2012 [facility] Patient Information 2014 [place], [place].

## 2013-04-03 NOTE — ED Notes (Signed)
Per EMS- Patient is from home. PEG tube out and needs to be replaced. Patient has a history of cerebral palsy.

## 2013-04-03 NOTE — ED Notes (Signed)
[facility] called for transportation to home 

## 2013-04-04 ENCOUNTER — Emergency Department (HOSPITAL_COMMUNITY): Payer: 59

## 2013-04-04 ENCOUNTER — Emergency Department (HOSPITAL_COMMUNITY)
Admission: EM | Admit: 2013-04-04 | Discharge: 2013-04-04 | Disposition: A | Discharge status: Hospice | Payer: 59 | Attending: Emergency Medicine | Admitting: Emergency Medicine

## 2013-04-04 ENCOUNTER — Other Ambulatory Visit (HOSPITAL_COMMUNITY): Payer: Self-pay | Admitting: Internal Medicine

## 2013-04-04 ENCOUNTER — Encounter (HOSPITAL_COMMUNITY): Payer: Self-pay | Admitting: Emergency Medicine

## 2013-04-04 DIAGNOSIS — Z794 Long term (current) use of insulin: Secondary | ICD-10-CM | POA: Insufficient documentation

## 2013-04-04 DIAGNOSIS — Z87440 Personal history of urinary (tract) infections: Secondary | ICD-10-CM | POA: Insufficient documentation

## 2013-04-04 DIAGNOSIS — K9423 Gastrostomy malfunction: Secondary | ICD-10-CM | POA: Insufficient documentation

## 2013-04-04 DIAGNOSIS — Z8701 Personal history of pneumonia (recurrent): Secondary | ICD-10-CM | POA: Insufficient documentation

## 2013-04-04 DIAGNOSIS — Z8669 Personal history of other diseases of the nervous system and sense organs: Secondary | ICD-10-CM | POA: Insufficient documentation

## 2013-04-04 DIAGNOSIS — Y833 Surgical operation with formation of external stoma as the cause of abnormal reaction of the patient, or of later complication, without mention of misadventure at the time of the procedure: Secondary | ICD-10-CM | POA: Insufficient documentation

## 2013-04-04 DIAGNOSIS — R633 Feeding difficulties, unspecified: Secondary | ICD-10-CM

## 2013-04-04 DIAGNOSIS — Z87448 Personal history of other diseases of urinary system: Secondary | ICD-10-CM | POA: Insufficient documentation

## 2013-04-04 DIAGNOSIS — E119 Type 2 diabetes mellitus without complications: Secondary | ICD-10-CM | POA: Insufficient documentation

## 2013-04-04 DIAGNOSIS — Z85528 Personal history of other malignant neoplasm of kidney: Secondary | ICD-10-CM | POA: Insufficient documentation

## 2013-04-04 DIAGNOSIS — Z9889 Other specified postprocedural states: Secondary | ICD-10-CM | POA: Insufficient documentation

## 2013-04-04 DIAGNOSIS — T85598A Other mechanical complication of other gastrointestinal prosthetic devices, implants and grafts, initial encounter: Secondary | ICD-10-CM

## 2013-04-04 DIAGNOSIS — Z79899 Other long term (current) drug therapy: Secondary | ICD-10-CM | POA: Insufficient documentation

## 2013-04-04 MED ORDER — IOHEXOL 300 MG/ML  SOLN
50.0000 mL | Freq: Once | INTRAMUSCULAR | Status: AC | PRN
Start: 2013-04-04 — End: 2013-04-04
  Administered 2013-04-04: 50 mL

## 2013-04-04 NOTE — ED Notes (Signed)
Pt from home, c/o peg tube dislogment. Pt woke up with it laying on her stomach. Pt had same problem yesterday, was seen at [facility] ed.

## 2013-04-04 NOTE — ED Provider Notes (Signed)
CSN: [id number]     Arrival date & time 04/04/13  0710 History   First MD Initiated Contact with Patient 04/04/13 [phone]     No chief complaint on file.    (Consider location/radiation/quality/duration/timing/severity/associated sxs/prior Treatment) HPI Comments: 73 yo female presenting after her PEG tube became dislodged.  She reports that it was placed over 2 years ago.  She reports that it fell out during her sleep.    Patient is a 73 y.o. female presenting with GI illness.  GI Problem This is a recurrent problem. The current episode started 6 to 12 hours ago. Episode frequency: once. The problem has not changed since onset.Pertinent negatives include no chest pain, no abdominal pain and no shortness of breath. Nothing aggravates the symptoms. Nothing relieves the symptoms.    Past Medical History  Diagnosis Date  . Diabetes mellitus   . Renal disorder   . CP (cerebral palsy)   . Parkinsonism   . Dysphagia   . Recurrent aspiration pneumonia   . Bladder spasms   . Amputation of arm   . Recurrent UTI   . Renal cancer     s/p nephrectomy  . Hospice care patient    Past Surgical History  Procedure Laterality Date  . 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 Peg tube placement    . Knee surgery    . Nephrectomy    . Esophagogastroduodenoscopy N/A 05/15/2012    Procedure: ESOPHAGOGASTRODUODENOSCOPY (EGD);  Surgeon: Cleotis Nipper, MD;  Location: [facility];  Service: Endoscopy;  Laterality: N/A;  Can do with standard sedation if MAC unavailable   Family History  Problem Relation Age of Onset  . CAD Mother   . CAD Mother    History  Substance Use Topics  . Smoking status: Never Smoker   . Smokeless tobacco: Never Used  . Alcohol Use: No   OB History   Grav Para Term Preterm Abortions TAB SAB Ect Mult Living                 Review of Systems  Constitutional: Negative for fever.  Respiratory: Negative for shortness of breath.   Cardiovascular: Negative  for chest pain.  Gastrointestinal: Negative for nausea and abdominal pain.  All other systems reviewed and are negative.      Allergies  Codeine and Morphine and related  Home Medications   Current Outpatient Rx  Name  Route  Sig  Dispense  Refill  . antiseptic oral rinse (BIOTENE) LIQD   Mouth Rinse   15 mLs by Mouth Rinse route 2 (two) times daily.         . bisacodyl (DULCOLAX) 10 MG suppository   Rectal   Place 10-20 mg rectally as needed for constipation.         . bisacodyl (GENTLE LAXATIVE) 5 MG EC tablet   Oral   Take 5-10 mg by mouth daily as needed for constipation.         . clonazePAM (KLONOPIN) 1 MG tablet   Oral   Take 1 mg by mouth 2 (two) times daily.          . diphenoxylate-atropine (LOMOTIL) 2.5-0.025 MG per tablet   Per Tube   Place 1 tablet into feeding tube 2 (two) times daily as needed for diarrhea or loose stools. For diarrhea         . fenofibrate (TRICOR) 145 MG tablet   Per Tube   Place  145 mg into feeding tube at bedtime.          . hydrALAZINE (APRESOLINE) 25 MG tablet   Per Tube   Place 1 tablet (25 mg total) into feeding tube 3 (three) times daily.   60 tablet   0   . HYDROcodone-acetaminophen (NORCO/VICODIN) 5-325 MG per tablet   Oral   Take 1 tablet by mouth 3 (three) times daily as needed for moderate pain.         . hydrocortisone cream 1 %   Topical   Apply 1 application topically 3 (three) times daily as needed (for rash on neck/back.).         Marland Kitchen insulin aspart (NOVOLOG) 100 UNIT/ML injection   Subcutaneous   Inject 0-15 Units into the skin 3 (three) times daily as needed for high blood sugar. Per sliding scale         . insulin detemir (LEVEMIR) 100 UNIT/ML injection   Subcutaneous   Inject 55 Units into the skin 2 (two) times daily as needed (high blood sugar).         Marland Kitchen levETIRAcetam (KEPPRA) 1000 MG tablet   Feeding Tube   1,000 mg by Feeding Tube route every 12 (twelve) hours.         .  metFORMIN (GLUCOPHAGE) 1000 MG tablet   Per Tube   Place 1,000 mg into feeding tube 2 (two) times daily with a meal.         . Nutritional Supplements (FEEDING SUPPLEMENT, GLUCERNA 1.2 CAL,) LIQD   Per Tube   Place 237 mLs into feeding tube 4 (four) times daily.         Marland Kitchen PARoxetine (PAXIL) 40 MG tablet   Per Tube   Place 40 mg into feeding tube every morning.          . phenytoin (DILANTIN) 125 MG/5ML suspension   Oral   Take 200 mg by mouth 2 (two) times daily.         . pregabalin (LYRICA) 150 MG capsule   Per Tube   Place 150 mg into feeding tube 2 (two) times daily.           BP 105/49  Pulse 96  Temp(Src) 99.7 F (37.6 C) (Oral)  Resp 16  SpO2 97% Physical Exam  Nursing note and vitals reviewed. Constitutional: She is oriented to person, place, and time. She appears well-developed and well-nourished. No distress.  HENT:  Head: Normocephalic and atraumatic.  Eyes: Conjunctivae are normal. No scleral icterus.  Neck: Neck supple.  Cardiovascular: Normal rate and intact distal pulses.   Pulmonary/Chest: Effort normal. No stridor. No respiratory distress.  Abdominal: Normal appearance. She exhibits no distension. There is no tenderness. There is no rebound and no guarding.  G tube site is clean and not erythematous or tender  Neurological: She is alert and oriented to person, place, and time.  Skin: Skin is warm and dry. No rash noted.  Psychiatric: She has a normal mood and affect. Her behavior is normal.    ED Course  FEEDING TUBE REPLACEMENT Date/Time: 04/04/2013 8:51 AM Performed by: Serita Grit DAVID III Authorized by: Serita Grit DAVID III Consent: Verbal consent obtained. Risks and benefits: risks, benefits and alternatives were discussed Consent given by: patient Indications: tube dislodged Tube type: gastrostomy Patient position: supine Procedure type: replacement Tube size: 20Fr. Bulb inflation volume: 6 (ml) Bulb inflation fluid:  sterile water Placement/position confirmation: x-ray Tube placement difficulty: none Patient tolerance: Patient tolerated the  procedure well with no immediate complications.   (including critical care time) Labs Review Labs Reviewed - No data to display Imaging Review Dg Abd 1 View  04/04/2013   CLINICAL DATA:  PEG tube check  EXAM: ABDOMEN - 1 VIEW  COMPARISON:  04/03/2013  FINDINGS: PEG tube was injected with 50 cc of Omnipaque 300 and a single image was obtained.  Contrast opacifies the gastric lumen compatible with normal intrahepatic position of the PEG tube.  Catheter balloon is at the distal gastric antrum.  Small amount contrast is seen within the duodenum indicating patency of pylorus.  No contrast extravasation.  Retained contrast within colon from prior contrast exam administration.  Paraspinal surgical clips left upper quadrant.  Osseous demineralization.  IMPRESSION: PEG tube is positioned within the gastric lumen without extravasation.   Electronically Signed   By: Lavonia Dana M.D.   On: 04/04/2013 09:20   Dg Abd 1 View  04/03/2013   CLINICAL DATA:  Repositioned gastrostomy tube.  EXAM: ABDOMEN - 1 VIEW  COMPARISON:  11/20/2012  FINDINGS: 50 mm of Omnipaque 300 was injected into the percutaneous gastrostomy tube. Contrast fills the stomach and extends into the duodenum and proximal small bowel. There is no contrast extravasation. The tube is well positioned.  There are stable surgical vascular clips superimpose adjacent to the greater curvature of the stomach.  IMPRESSION: Percutaneous gastrostomy tube is well positioned and ready for use.   Electronically Signed   By: Lajean Manes M.D.   On: 04/03/2013 19:12  All radiology studies independently viewed by me.     EKG Interpretation   None       MDM   Final diagnoses:  Feeding tube dysfunction    G tube dislodged overnight.  Replaced with 20Fr tube.  She denied other complaints or problems.      Houston Siren III,  MD 04/04/13 [phone]

## 2013-04-05 ENCOUNTER — Ambulatory Visit (HOSPITAL_COMMUNITY)
Admission: RE | Admit: 2013-04-05 | Discharge: 2013-04-05 | Disposition: A | Discharge status: Home / Self Care | Payer: 59 | Source: Ambulatory Visit | Attending: Internal Medicine | Admitting: Internal Medicine

## 2013-04-05 DIAGNOSIS — R633 Feeding difficulties, unspecified: Secondary | ICD-10-CM

## 2013-04-05 DIAGNOSIS — Z431 Encounter for attention to gastrostomy: Secondary | ICD-10-CM | POA: Insufficient documentation

## 2013-04-05 MED ORDER — IOHEXOL 300 MG/ML  SOLN
50.0000 mL | Freq: Once | INTRAMUSCULAR | Status: AC | PRN
  Administered 2013-04-05: 10 mL

## 2014-01-07 DEATH — deceased

## 2014-07-19 IMAGING — CR DG ABDOMEN 1V
1 series · 1 of 1 positions shown · non-contrast
Comparison: 10/27/2011.

CLINICAL DATA: Evaluate G-tube placement.

ABDOMEN - 1 VIEW

[x abdomen supine]
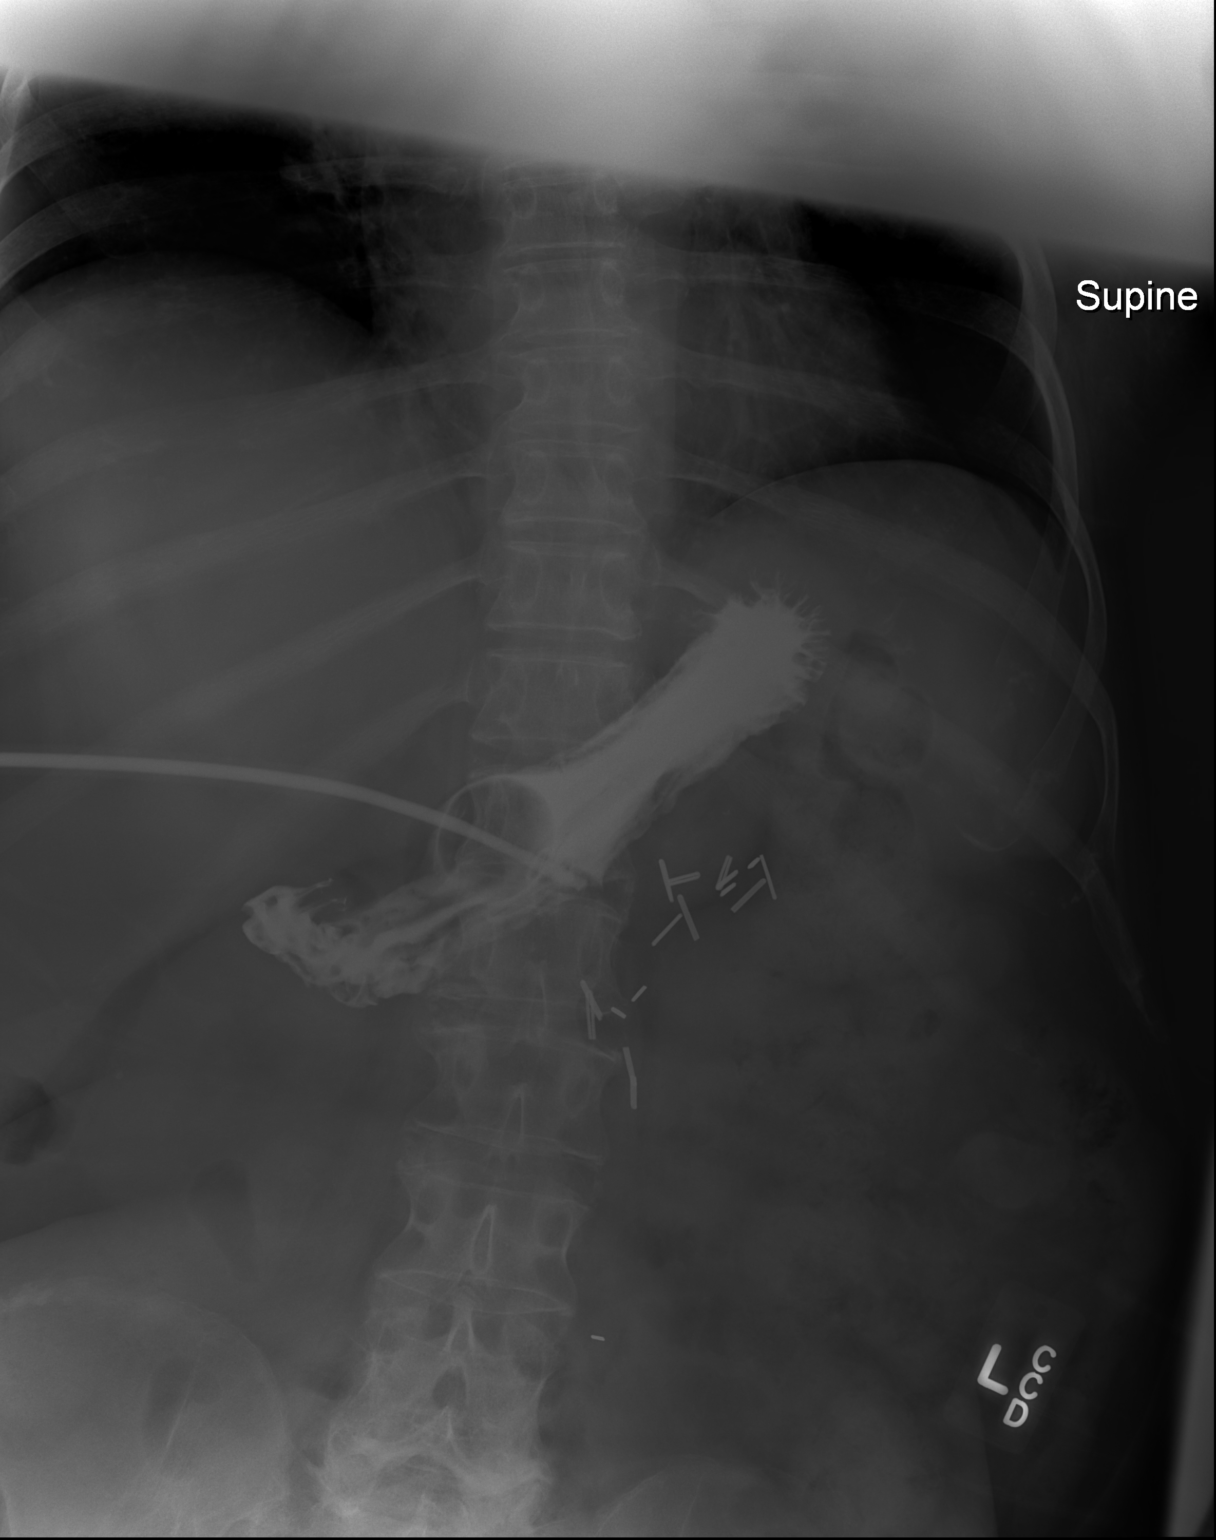

[1 of 1 positions shown; findings below may reference images not displayed]

FINDINGS: A single radiograph of the upper abdomen was obtained
following the injection of the patient's G-tube with contrast
material.  The injected contrast material is confined completely
within the stomach (no extravasation noted).  Multiple surgical
clips are seen inferior to the stomach.  Visualized bowel gas
pattern is nonobstructive.
IMPRESSION: 1.  G-tube appears properly located.
